# Patient Record
Sex: Male | Born: 1940 | Race: White | Hispanic: No | Marital: Married | Smoking: Former smoker
Health system: Southern US, Community
[De-identification: ages and names within clinical notes are randomized; demographics above are authoritative.]

## PROBLEM LIST (undated history)

## (undated) DIAGNOSIS — I4891 Unspecified atrial fibrillation: Secondary | ICD-10-CM

## (undated) DIAGNOSIS — I10 Essential (primary) hypertension: Secondary | ICD-10-CM

## (undated) DIAGNOSIS — F419 Anxiety disorder, unspecified: Secondary | ICD-10-CM

## (undated) DIAGNOSIS — F0390 Unspecified dementia without behavioral disturbance: Secondary | ICD-10-CM

## (undated) DIAGNOSIS — E119 Type 2 diabetes mellitus without complications: Secondary | ICD-10-CM

## (undated) DIAGNOSIS — H539 Unspecified visual disturbance: Secondary | ICD-10-CM

## (undated) DIAGNOSIS — E785 Hyperlipidemia, unspecified: Secondary | ICD-10-CM

## (undated) HISTORY — PX: COLONOSCOPY W/ BIOPSIES AND POLYPECTOMY: SHX1376

## (undated) HISTORY — PX: TRANSURETHRAL RESECTION OF PROSTATE: SHX73

## (undated) HISTORY — PX: CATARACT EXTRACTION W/ INTRAOCULAR LENS IMPLANT: SHX1309

## (undated) HISTORY — DX: Hyperlipidemia, unspecified: E78.5

## (undated) HISTORY — DX: Unspecified visual disturbance: H53.9

---

## 1998-10-18 ENCOUNTER — Encounter: Payer: Self-pay | Admitting: Cardiology

## 1998-10-18 ENCOUNTER — Emergency Department (HOSPITAL_COMMUNITY): Admission: EM | Admit: 1998-10-18 | Discharge: 1998-10-18 | Payer: Self-pay | Admitting: *Deleted

## 2004-11-02 ENCOUNTER — Ambulatory Visit (HOSPITAL_COMMUNITY): Admission: RE | Admit: 2004-11-02 | Discharge: 2004-11-02 | Payer: Self-pay | Admitting: Gastroenterology

## 2005-08-06 ENCOUNTER — Ambulatory Visit (HOSPITAL_BASED_OUTPATIENT_CLINIC_OR_DEPARTMENT_OTHER): Admission: RE | Admit: 2005-08-06 | Discharge: 2005-08-06 | Payer: Self-pay | Admitting: Urology

## 2014-03-25 ENCOUNTER — Ambulatory Visit: Payer: Self-pay | Admitting: *Deleted

## 2014-04-16 ENCOUNTER — Ambulatory Visit: Payer: Self-pay | Admitting: *Deleted

## 2016-03-01 ENCOUNTER — Other Ambulatory Visit: Payer: Self-pay | Admitting: Gastroenterology

## 2016-04-04 ENCOUNTER — Encounter (HOSPITAL_COMMUNITY): Payer: Self-pay | Admitting: *Deleted

## 2016-04-10 ENCOUNTER — Ambulatory Visit (HOSPITAL_COMMUNITY)
Admission: RE | Admit: 2016-04-10 | Discharge: 2016-04-10 | Disposition: A | Discharge status: Home / Self Care | Payer: Medicare Other | Source: Ambulatory Visit | Attending: Gastroenterology | Admitting: Gastroenterology

## 2016-04-10 ENCOUNTER — Encounter (HOSPITAL_COMMUNITY): Payer: Self-pay

## 2016-04-10 ENCOUNTER — Encounter (HOSPITAL_COMMUNITY)
Admission: RE | Disposition: A | Discharge status: Home / Self Care | Payer: Self-pay | Source: Ambulatory Visit | Attending: Gastroenterology

## 2016-04-10 ENCOUNTER — Ambulatory Visit (HOSPITAL_COMMUNITY): Payer: Medicare Other | Admitting: Certified Registered Nurse Anesthetist

## 2016-04-10 DIAGNOSIS — Z79899 Other long term (current) drug therapy: Secondary | ICD-10-CM | POA: Insufficient documentation

## 2016-04-10 DIAGNOSIS — E7800 Pure hypercholesterolemia, unspecified: Secondary | ICD-10-CM | POA: Diagnosis not present

## 2016-04-10 DIAGNOSIS — K621 Rectal polyp: Secondary | ICD-10-CM | POA: Insufficient documentation

## 2016-04-10 DIAGNOSIS — Z7984 Long term (current) use of oral hypoglycemic drugs: Secondary | ICD-10-CM | POA: Diagnosis not present

## 2016-04-10 DIAGNOSIS — D124 Benign neoplasm of descending colon: Secondary | ICD-10-CM | POA: Insufficient documentation

## 2016-04-10 DIAGNOSIS — Z86010 Personal history of colonic polyps: Secondary | ICD-10-CM | POA: Diagnosis not present

## 2016-04-10 DIAGNOSIS — Z1211 Encounter for screening for malignant neoplasm of colon: Secondary | ICD-10-CM | POA: Diagnosis not present

## 2016-04-10 DIAGNOSIS — I10 Essential (primary) hypertension: Secondary | ICD-10-CM | POA: Insufficient documentation

## 2016-04-10 DIAGNOSIS — Z87891 Personal history of nicotine dependence: Secondary | ICD-10-CM | POA: Insufficient documentation

## 2016-04-10 DIAGNOSIS — D122 Benign neoplasm of ascending colon: Secondary | ICD-10-CM | POA: Insufficient documentation

## 2016-04-10 DIAGNOSIS — E119 Type 2 diabetes mellitus without complications: Secondary | ICD-10-CM | POA: Insufficient documentation

## 2016-04-10 HISTORY — PX: COLONOSCOPY WITH PROPOFOL: SHX5780

## 2016-04-10 HISTORY — DX: Essential (primary) hypertension: I10

## 2016-04-10 HISTORY — DX: Anxiety disorder, unspecified: F41.9

## 2016-04-10 LAB — GLUCOSE, CAPILLARY: Glucose-Capillary: 88 mg/dL (ref 65–99)

## 2016-04-10 SURGERY — COLONOSCOPY WITH PROPOFOL
Anesthesia: Monitor Anesthesia Care

## 2016-04-10 MED ORDER — PROPOFOL 10 MG/ML IV BOLUS
INTRAVENOUS | Status: AC
  Filled 2016-04-10: qty 20

## 2016-04-10 MED ORDER — ONDANSETRON HCL 4 MG/2ML IJ SOLN
INTRAMUSCULAR | Status: AC
  Filled 2016-04-10: qty 2

## 2016-04-10 MED ORDER — SODIUM CHLORIDE 0.9 % IV SOLN: INTRAVENOUS | Status: DC

## 2016-04-10 MED ORDER — PROPOFOL 10 MG/ML IV BOLUS
INTRAVENOUS | Status: DC | PRN
  Administered 2016-04-10: 20 mg via INTRAVENOUS
  Administered 2016-04-10: 10 mg via INTRAVENOUS

## 2016-04-10 MED ORDER — PROPOFOL 10 MG/ML IV BOLUS
INTRAVENOUS | Status: AC
  Filled 2016-04-10: qty 40

## 2016-04-10 MED ORDER — PROPOFOL 500 MG/50ML IV EMUL
INTRAVENOUS | Status: DC | PRN
  Administered 2016-04-10: 150 ug/kg/min via INTRAVENOUS

## 2016-04-10 MED ORDER — ONDANSETRON HCL 4 MG/2ML IJ SOLN
INTRAMUSCULAR | Status: DC | PRN
  Administered 2016-04-10: 4 mg via INTRAVENOUS

## 2016-04-10 MED ORDER — LACTATED RINGERS IV SOLN
INTRAVENOUS | Status: DC
  Administered 2016-04-10: 10:00:00 via INTRAVENOUS

## 2016-04-10 SURGICAL SUPPLY — 22 items

## 2016-04-10 NOTE — H&P (Signed)
  Procedure: Surveillance colonoscopy. 11/14/2009 colonoscopy was performed with removal of two diminutive adenomatous colon polyps  History: The patient is a 75 year old male born August 21, 1940. He is scheduled to undergo a surveillance colonoscopy today.  Past medical history: Hypertension. Type 2 diabetes mellitus. Hypercholesterolemia. Benign prostatic hypertrophy.  Medication allergies: Norvasc. Lipitor. Crestor. Pravastatin.  Exam: The patient is alert and lying comfortably on the endoscopy stretcher. Abdomen is soft and nontender to palpation. Lungs are clear to auscultation. Cardiac exam reveals a regular rhythm.  Plan: Proceed with surveillance colonoscopy

## 2016-04-10 NOTE — Discharge Instructions (Signed)
YOU HAD AN ENDOSCOPIC PROCEDURE TODAY: Refer to the procedure report and other information in the discharge instructions given to you for any specific questions about what was found during the examination. If this information does not answer your questions, please call Dr. Wynetta Emery office at [phone] to clarify.   YOU SHOULD EXPECT: Some feelings of bloating in the abdomen. Passage of more gas than usual. Walking can help get rid of the air that was put into your GI tract during the procedure and reduce the bloating. If you had a lower endoscopy (such as a colonoscopy or flexible sigmoidoscopy) you may notice spotting of blood in your stool or on the toilet paper. Some abdominal soreness may be present for a day or two, also.  DIET: Your first meal following the procedure should be a light meal and then it is ok to progress to your normal diet. A half-sandwich or bowl of soup is an example of a good first meal. Heavy or fried foods are harder to digest and may make you feel nauseous or bloated. Drink plenty of fluids but you should avoid alcoholic beverages for 24 hours. If you had a esophageal dilation, please see attached instructions for diet.   ACTIVITY: Your care partner should take you home directly after the procedure. You should plan to take it easy, moving slowly for the rest of the day. You can resume normal activity the day after the procedure however YOU SHOULD NOT DRIVE, use power tools, machinery or perform tasks that involve climbing or major physical exertion for 24 hours (because of the sedation medicines used during the test).   SYMPTOMS TO REPORT IMMEDIATELY: A gastroenterologist can be reached at any hour. Please call [phone]  for any of the following symptoms:  Following lower endoscopy (colonoscopy, flexible sigmoidoscopy) Excessive amounts of blood in the stool  Significant tenderness, worsening of abdominal pains  Swelling of the abdomen that is new, acute  Fever of 100  or higher  Following upper endoscopy (EGD, EUS, ERCP, esophageal dilation) Vomiting of blood or coffee ground material  New, significant abdominal pain  New, significant chest pain or pain under the shoulder blades  Painful or persistently difficult swallowing  New shortness of breath  Black, tarry-looking or red, bloody stools  FOLLOW UP:  If any biopsies were taken you will be contacted by phone or by letter within the next 1-3 weeks. Call [phone]  if you have not heard about the biopsies in 3 weeks.  Please also call with any specific questions about appointments or follow up tests.

## 2016-04-10 NOTE — Anesthesia Preprocedure Evaluation (Signed)
Anesthesia Evaluation  Patient identified by MRN, date of birth, ID band Patient awake    Reviewed: Allergy & Precautions, NPO status , Patient's Chart, lab work & pertinent test results  Airway Mallampati: II  TM Distance: >3 FB Neck ROM: Full    Dental   Pulmonary former smoker,    breath sounds clear to auscultation       Cardiovascular hypertension, Pt. on medications  Rhythm:Regular Rate:Normal     Neuro/Psych Anxiety negative neurological ROS     GI/Hepatic negative GI ROS, Neg liver ROS,   Endo/Other  diabetes, Type 2, Oral Hypoglycemic Agents  Renal/GU negative Renal ROS     Musculoskeletal   Abdominal   Peds  Hematology negative hematology ROS (+)   Anesthesia Other Findings   Reproductive/Obstetrics                             Anesthesia Physical Anesthesia Plan  ASA: II  Anesthesia Plan: MAC   Post-op Pain Management:    Induction: Intravenous  Airway Management Planned: Nasal Cannula, Natural Airway and Simple Face Mask  Additional Equipment:   Intra-op Plan:   Post-operative Plan:   Informed Consent: I have reviewed the patients History and Physical, chart, labs and discussed the procedure including the risks, benefits and alternatives for the proposed anesthesia with the patient or authorized representative who has indicated his/her understanding and acceptance.     Plan Discussed with: CRNA  Anesthesia Plan Comments:         Anesthesia Quick Evaluation

## 2016-04-10 NOTE — Anesthesia Postprocedure Evaluation (Signed)
Anesthesia Post Note  Patient: Richard Frederick  Procedure(s) Performed: Procedure(s) (LRB): COLONOSCOPY WITH PROPOFOL (N/A)  Patient location during evaluation: PACU Anesthesia Type: MAC Level of consciousness: awake and alert Pain management: pain level controlled Vital Signs Assessment: post-procedure vital signs reviewed and stable Respiratory status: spontaneous breathing, nonlabored ventilation, respiratory function stable and patient connected to nasal cannula oxygen Cardiovascular status: stable and blood pressure returned to baseline Anesthetic complications: no    Last Vitals:  Vitals:   04/10/16 1148 04/10/16 1200  BP: 108/73 (!) 128/98  Pulse: 74 63  Resp: 10 11  Temp: 36.4 C     Last Pain:  Vitals:   04/10/16 1148  TempSrc: Oral                 Tiajuana Amass

## 2016-04-10 NOTE — Transfer of Care (Signed)
Immediate Anesthesia Transfer of Care Note  Patient: Richard Frederick  Procedure(s) Performed: Procedure(s): COLONOSCOPY WITH PROPOFOL (N/A)  Patient Location: PACU  Anesthesia Type:MAC  Level of Consciousness:  sedated, patient cooperative and responds to stimulation  Airway & Oxygen Therapy:Patient Spontanous Breathing and Patient connected to face mask oxgen  Post-op Assessment:  Report given to PACU RN and Post -op Vital signs reviewed and stable  Post vital signs:  Reviewed and stable  Last Vitals:  Vitals:   04/10/16 1005  BP: (!) 169/91  Pulse: 70  Resp: 20  Temp: [id number] C    Complications: No apparent anesthesia complications

## 2016-04-10 NOTE — Op Note (Signed)
[facility] Patient Name: Richard Frederick Procedure Date: 04/10/2016 MRN: [id number] Attending MD: Garlan Fair , MD Date of Birth: 02-25-1941 CSN: [id number] Age: 75 Admit Type: Outpatient Procedure:                Colonoscopy Indications:              High risk colon cancer surveillance: Personal                            history of adenoma less than 10 mm in size Providers:                Garlan Fair, MD, Elmer Ramp. Tilden Dome, RN, [employer], Technician, Christell Faith, CRNA Referring MD:              Medicines:                Propofol per Anesthesia Complications:            No immediate complications. Estimated Blood Loss:     Estimated blood loss: none. Procedure:                Pre-Anesthesia Assessment:                           - Prior to the procedure, a History and Physical                            was performed, and patient medications and                            allergies were reviewed. The patient's tolerance of                            previous anesthesia was also reviewed. The risks                            and benefits of the procedure and the sedation                            options and risks were discussed with the patient.                            All questions were answered, and informed consent                            was obtained. Prior Anticoagulants: The patient has                            taken aspirin, last dose was 1 day prior to                            procedure. ASA Grade Assessment: II - A patient  with mild systemic disease. After reviewing the                            risks and benefits, the patient was deemed in                            satisfactory condition to undergo the procedure.                           After obtaining informed consent, the colonoscope                            was passed under direct vision. Throughout the                             procedure, the patient's blood pressure, pulse, and                            oxygen saturations were monitored continuously. The                            [id number]) scope was introduced through                            the anus and advanced to the the cecum, identified                            by appendiceal orifice and ileocecal valve. The                            colonoscopy was performed without difficulty. The                            patient tolerated the procedure well. The quality                            of the bowel preparation was good. The ileocecal                            valve, the appendiceal orifice and the rectum were                            photographed. Scope In: 11:03:55 AM Scope Out: 11:35:34 AM Scope Withdrawal Time: 0 hours 24 minutes 34 seconds  Total Procedure Duration: 0 hours 31 minutes 39 seconds  Findings:      The perianal and digital rectal examinations were normal.      Two sessile polyps were found in the ascending colon. The polyps were 5       mm in size. These polyps were removed with a cold snare. Resection and       retrieval were complete.      A 5 mm polyp was found in the descending colon. The polyp was sessile.       The polyp was removed with a cold snare. Resection and  retrieval were       complete.      A 5 mm polyp was found in the rectum. The polyp was sessile. The polyp       was removed with a cold snare. Resection and retrieval were complete.      The exam was otherwise without abnormality. Impression:               - Two 5 mm polyps in the ascending colon, removed                            with a cold snare. Resected and retrieved.                           - One 5 mm polyp in the descending colon, removed                            with a cold snare. Resected and retrieved.                           - One 5 mm polyp in the rectum, removed with a cold                            snare. Resected and  retrieved.                           - The examination was otherwise normal. Moderate Sedation:      N/A- Per Anesthesia Care Recommendation:           - Patient has a contact number available for                            emergencies. The signs and symptoms of potential                            delayed complications were discussed with the                            patient. Return to normal activities tomorrow.                            Written discharge instructions were provided to the                            patient.                           - Repeat colonoscopy is not recommended for                            surveillance.                           - Resume previous diet.                           - Continue present  medications. Procedure Code(s):        --- Professional ---                           [phone], Colonoscopy, flexible; with removal of                            tumor(s), polyp(s), or other lesion(s) by snare                            technique Diagnosis Code(s):        --- Professional ---                           Z86.010, Personal history of colonic polyps                           D12.2, Benign neoplasm of ascending colon                           D12.4, Benign neoplasm of descending colon                           K62.1, Rectal polyp CPT copyright 2016 American Medical Association. All rights reserved. The codes documented in this report are preliminary and upon coder review may  be revised to meet current compliance requirements. Earle Gell, MD Garlan Fair, MD 04/10/2016 11:44:22 AM This report has been signed electronically. Number of Addenda: 0

## 2016-04-10 NOTE — Anesthesia Procedure Notes (Addendum)
Procedure Name: MAC Date/Time: 04/10/2016 10:59 AM Performed by: West Pugh Pre-anesthesia Checklist: Patient identified, Timeout performed, Emergency Drugs available, Suction available and Patient being monitored Patient Re-evaluated:Patient Re-evaluated prior to inductionOxygen Delivery Method: Simple face mask Dental Injury: Teeth and Oropharynx as per pre-operative assessment

## 2016-04-24 ENCOUNTER — Encounter (HOSPITAL_COMMUNITY): Payer: Self-pay | Admitting: *Deleted

## 2016-04-24 ENCOUNTER — Emergency Department (HOSPITAL_COMMUNITY): Payer: Medicare Other

## 2016-04-24 ENCOUNTER — Observation Stay (HOSPITAL_COMMUNITY)
Admission: EM | Admit: 2016-04-24 | Discharge: 2016-04-25 | Disposition: A | Discharge status: Home / Self Care | Payer: Medicare Other | Attending: Internal Medicine | Admitting: Internal Medicine

## 2016-04-24 DIAGNOSIS — R42 Dizziness and giddiness: Principal | ICD-10-CM

## 2016-04-24 DIAGNOSIS — E119 Type 2 diabetes mellitus without complications: Secondary | ICD-10-CM

## 2016-04-24 DIAGNOSIS — F419 Anxiety disorder, unspecified: Secondary | ICD-10-CM | POA: Diagnosis not present

## 2016-04-24 DIAGNOSIS — Z803 Family history of malignant neoplasm of breast: Secondary | ICD-10-CM | POA: Diagnosis not present

## 2016-04-24 DIAGNOSIS — Z7982 Long term (current) use of aspirin: Secondary | ICD-10-CM | POA: Insufficient documentation

## 2016-04-24 DIAGNOSIS — Z9889 Other specified postprocedural states: Secondary | ICD-10-CM | POA: Insufficient documentation

## 2016-04-24 DIAGNOSIS — D72829 Elevated white blood cell count, unspecified: Secondary | ICD-10-CM | POA: Insufficient documentation

## 2016-04-24 DIAGNOSIS — Z87891 Personal history of nicotine dependence: Secondary | ICD-10-CM | POA: Insufficient documentation

## 2016-04-24 DIAGNOSIS — Z7984 Long term (current) use of oral hypoglycemic drugs: Secondary | ICD-10-CM | POA: Diagnosis not present

## 2016-04-24 DIAGNOSIS — I10 Essential (primary) hypertension: Secondary | ICD-10-CM | POA: Diagnosis not present

## 2016-04-24 DIAGNOSIS — Z8249 Family history of ischemic heart disease and other diseases of the circulatory system: Secondary | ICD-10-CM | POA: Insufficient documentation

## 2016-04-24 HISTORY — DX: Type 2 diabetes mellitus without complications: E11.9

## 2016-04-24 LAB — COMPREHENSIVE METABOLIC PANEL
ALT: 18 U/L (ref 17–63)
ANION GAP: 7 (ref 5–15)
AST: 21 U/L (ref 15–41)
Albumin: 4 g/dL (ref 3.5–5.0)
Alkaline Phosphatase: 48 U/L (ref 38–126)
BILIRUBIN TOTAL: 0.8 mg/dL (ref 0.3–1.2)
BUN: 20 mg/dL (ref 6–20)
CHLORIDE: 104 mmol/L (ref 101–111)
CO2: 24 mmol/L (ref 22–32)
Calcium: 9.6 mg/dL (ref 8.9–10.3)
Creatinine, Ser: 1.08 mg/dL (ref 0.61–1.24)
Glucose, Bld: 167 mg/dL — ABNORMAL HIGH (ref 65–99)
POTASSIUM: 4.1 mmol/L (ref 3.5–5.1)
Sodium: 135 mmol/L (ref 135–145)
TOTAL PROTEIN: 6.8 g/dL (ref 6.5–8.1)

## 2016-04-24 LAB — URINALYSIS, ROUTINE W REFLEX MICROSCOPIC
BILIRUBIN URINE: NEGATIVE
Glucose, UA: 100 mg/dL — AB
Hgb urine dipstick: NEGATIVE
KETONES UR: NEGATIVE mg/dL
LEUKOCYTES UA: NEGATIVE
NITRITE: NEGATIVE
PROTEIN: 100 mg/dL — AB
Specific Gravity, Urine: 1.025 (ref 1.005–1.030)
pH: 6.5 (ref 5.0–8.0)

## 2016-04-24 LAB — CBC
HEMATOCRIT: 47.6 % (ref 39.0–52.0)
HEMOGLOBIN: 15.7 g/dL (ref 13.0–17.0)
MCH: 29.1 pg (ref 26.0–34.0)
MCHC: 33 g/dL (ref 30.0–36.0)
MCV: 88.1 fL (ref 78.0–100.0)
Platelets: 233 10*3/uL (ref 150–400)
RBC: 5.4 MIL/uL (ref 4.22–5.81)
RDW: 13 % (ref 11.5–15.5)
WBC: 9 10*3/uL (ref 4.0–10.5)

## 2016-04-24 LAB — URINE MICROSCOPIC-ADD ON: RBC / HPF: NONE SEEN RBC/hpf (ref 0–5)

## 2016-04-24 LAB — PROTIME-INR
INR: 1.09
Prothrombin Time: 14.1 seconds (ref 11.4–15.2)

## 2016-04-24 LAB — DIFFERENTIAL
BASOS PCT: 0 %
Basophils Absolute: 0 10*3/uL (ref 0.0–0.1)
EOS ABS: 0 10*3/uL (ref 0.0–0.7)
Eosinophils Relative: 0 %
LYMPHS ABS: 0.9 10*3/uL (ref 0.7–4.0)
Lymphocytes Relative: 10 %
MONO ABS: 0.2 10*3/uL (ref 0.1–1.0)
MONOS PCT: 2 %
Neutro Abs: 7.9 10*3/uL — ABNORMAL HIGH (ref 1.7–7.7)
Neutrophils Relative %: 88 %

## 2016-04-24 LAB — APTT: aPTT: 28 seconds (ref 24–36)

## 2016-04-24 LAB — GLUCOSE, CAPILLARY: GLUCOSE-CAPILLARY: 204 mg/dL — AB (ref 65–99)

## 2016-04-24 LAB — I-STAT TROPONIN, ED: TROPONIN I, POC: 0.01 ng/mL (ref 0.00–0.08)

## 2016-04-24 MED ORDER — LISINOPRIL 10 MG PO TABS
10.0000 mg | ORAL_TABLET | Freq: Every day | ORAL | Status: DC
  Administered 2016-04-24 – 2016-04-25 (×2): 10 mg via ORAL
  Filled 2016-04-24 (×2): qty 1

## 2016-04-24 MED ORDER — ASPIRIN EC 81 MG PO TBEC
81.0000 mg | DELAYED_RELEASE_TABLET | Freq: Every day | ORAL | Status: DC
  Administered 2016-04-24 – 2016-04-25 (×2): 81 mg via ORAL
  Filled 2016-04-24 (×2): qty 1

## 2016-04-24 MED ORDER — LISINOPRIL-HYDROCHLOROTHIAZIDE 10-12.5 MG PO TABS: 1.0000 | ORAL_TABLET | Freq: Every day | ORAL | Status: DC

## 2016-04-24 MED ORDER — MECLIZINE HCL 25 MG PO TABS
25.0000 mg | ORAL_TABLET | Freq: Once | ORAL | Status: AC
  Administered 2016-04-24: 25 mg via ORAL
  Filled 2016-04-24: qty 1

## 2016-04-24 MED ORDER — DIAZEPAM 2 MG PO TABS
2.0000 mg | ORAL_TABLET | Freq: Three times a day (TID) | ORAL | Status: DC | PRN
Start: 2016-04-24 — End: 2016-04-25

## 2016-04-24 MED ORDER — ZOLPIDEM TARTRATE 5 MG PO TABS
5.0000 mg | ORAL_TABLET | Freq: Once | ORAL | Status: AC
  Administered 2016-04-24: 5 mg via ORAL
  Filled 2016-04-24: qty 1

## 2016-04-24 MED ORDER — SODIUM CHLORIDE 0.9 % IV SOLN
INTRAVENOUS | Status: AC
  Administered 2016-04-24: 1000 mL via INTRAVENOUS

## 2016-04-24 MED ORDER — ACETAMINOPHEN 650 MG RE SUPP: 650.0000 mg | Freq: Four times a day (QID) | RECTAL | Status: DC | PRN

## 2016-04-24 MED ORDER — DIAZEPAM 5 MG/ML IJ SOLN
2.5000 mg | Freq: Once | INTRAMUSCULAR | Status: AC
  Administered 2016-04-24: 2.5 mg via INTRAVENOUS
  Filled 2016-04-24: qty 2

## 2016-04-24 MED ORDER — ONDANSETRON HCL 4 MG PO TABS: 4.0000 mg | ORAL_TABLET | Freq: Four times a day (QID) | ORAL | Status: DC | PRN

## 2016-04-24 MED ORDER — ENOXAPARIN SODIUM 40 MG/0.4ML ~~LOC~~ SOLN
40.0000 mg | SUBCUTANEOUS | Status: DC
  Administered 2016-04-24: 40 mg via SUBCUTANEOUS
  Filled 2016-04-24: qty 0.4

## 2016-04-24 MED ORDER — MECLIZINE HCL 25 MG PO TABS
25.0000 mg | ORAL_TABLET | Freq: Three times a day (TID) | ORAL | Status: DC
  Administered 2016-04-24 – 2016-04-25 (×2): 25 mg via ORAL
  Filled 2016-04-24 (×2): qty 1

## 2016-04-24 MED ORDER — SODIUM CHLORIDE 0.9 % IV BOLUS (SEPSIS)
500.0000 mL | Freq: Once | INTRAVENOUS | Status: AC
  Administered 2016-04-24: 500 mL via INTRAVENOUS

## 2016-04-24 MED ORDER — HYDROCHLOROTHIAZIDE 12.5 MG PO CAPS
12.5000 mg | ORAL_CAPSULE | Freq: Every day | ORAL | Status: DC
  Administered 2016-04-24 – 2016-04-25 (×2): 12.5 mg via ORAL
  Filled 2016-04-24 (×2): qty 1

## 2016-04-24 MED ORDER — INSULIN ASPART 100 UNIT/ML ~~LOC~~ SOLN
0.0000 [IU] | Freq: Three times a day (TID) | SUBCUTANEOUS | Status: DC
  Administered 2016-04-25: 3 [IU] via SUBCUTANEOUS

## 2016-04-24 MED ORDER — ONDANSETRON HCL 4 MG/2ML IJ SOLN: 4.0000 mg | Freq: Four times a day (QID) | INTRAMUSCULAR | Status: DC | PRN

## 2016-04-24 MED ORDER — ACETAMINOPHEN 325 MG PO TABS: 650.0000 mg | ORAL_TABLET | Freq: Four times a day (QID) | ORAL | Status: DC | PRN

## 2016-04-24 MED ORDER — HYDRALAZINE HCL 20 MG/ML IJ SOLN: 10.0000 mg | Freq: Four times a day (QID) | INTRAMUSCULAR | Status: DC | PRN

## 2016-04-24 MED ORDER — PAROXETINE HCL 20 MG PO TABS
20.0000 mg | ORAL_TABLET | Freq: Every day | ORAL | Status: DC
  Administered 2016-04-25: 20 mg via ORAL
  Filled 2016-04-24: qty 1

## 2016-04-24 NOTE — ED Notes (Signed)
Attempted to ambulate pt Pt was unable to stand up straight and was leaning backwards while standing stating he was really dizzy. Pt helped back in the bed and Dr. Billy Fischer informed.

## 2016-04-24 NOTE — ED Notes (Signed)
Patient transported to MRI 

## 2016-04-24 NOTE — ED Notes (Signed)
Attempted report to 5W. 

## 2016-04-24 NOTE — Progress Notes (Signed)
Patient trasfered from ED to [phone] via stretcher; alert and oriented x 4; no complaints of pain; IV saline locked in RH; skin intact. Orient patient to room and unit; gave patient care guide; instructed how to use the call bell and  fall risk precautions. Will continue to monitor the patient.

## 2016-04-24 NOTE — Progress Notes (Signed)
Patient requesting something for sleep.  Paged on call, Baltazar Najjar. Waiting to hear back.

## 2016-04-24 NOTE — ED Provider Notes (Signed)
[facility] DEPT Provider Note   CSN: [id number] Arrival date & time: 04/24/16  [id number]     History   Chief Complaint Chief Complaint  Patient presents with  . Dizziness    HPI KENNAN MCDOLE is a 75 y.o. male.  HPI   Woke up at 3AM per pt 5AM per wife feeling dizzy, couldn't walk, had to hold onto wall.  18 years ago had vertigo. Emesis.  Lasted a few hours.  Now it's better. When had head vertical would go away. When turned head a little bit would come back, severe.  No other neuro symptoms except difficulty walking because of dizziness.  Severe vertigo with movement.  Past Medical History:  Diagnosis Date  . Anxiety   . Diabetes mellitus without complication ([place])   . Hypertension     Patient Active Problem List   Diagnosis Date Noted  . Vertigo 04/24/2016  . Essential hypertension 04/24/2016  . DM (diabetes mellitus), type 2 ([place]) 04/24/2016    Past Surgical History:  Procedure Laterality Date  . COLONOSCOPY WITH PROPOFOL N/A 04/10/2016   Procedure: COLONOSCOPY WITH PROPOFOL;  Surgeon: Garlan Fair, MD;  Location: [facility] ENDOSCOPY;  Service: Endoscopy;  Laterality: N/A;  . colonscopy    . TRANSURETHRAL RESECTION OF PROSTATE  10-12-yrs ago       Home Medications    Prior to Admission medications   Medication Sig Start Date End Date Taking? Authorizing Provider  aspirin 81 MG tablet Take 81 mg by mouth daily.   Yes Historical Provider, MD  Cyanocobalamin (VITAMIN B 12 PO) Take by mouth. 1000 mg daily   Yes Historical Provider, MD  lisinopril-hydrochlorothiazide (PRINZIDE,ZESTORETIC) 10-12.5 MG tablet Take 1 tablet by mouth daily.   Yes Historical Provider, MD  metFORMIN (GLUCOPHAGE) 500 MG tablet Take by mouth 2 (two) times daily with a meal.   Yes Historical Provider, MD  PARoxetine (PAXIL) 20 MG tablet Take 20 mg by mouth daily.   Yes Historical Provider, MD    Family History Family History  Problem Relation Age of Onset  . Breast cancer Sister   .  Heart disease Sister     Social History Social History  Substance Use Topics  . Smoking status: Former Smoker    Quit date: 07/30/1980  . Smokeless tobacco: Former Systems developer  . Alcohol use No     Allergies   Review of patient's allergies indicates no known allergies.   Review of Systems Review of Systems  Constitutional: Negative for fever.  HENT: Positive for tinnitus (chronic since that time). Negative for congestion.   Eyes: Negative for visual disturbance.  Respiratory: Negative for shortness of breath.   Cardiovascular: Negative for chest pain.  Gastrointestinal: Positive for nausea and vomiting. Negative for abdominal pain and diarrhea.  Genitourinary: Negative for difficulty urinating and dysuria.  Neurological: Positive for dizziness and headaches (very slight on the way here). Negative for syncope, facial asymmetry, speech difficulty, weakness, light-headedness and numbness.     Physical Exam        Updated Vital Signs BP 133/66 (BP Location: Right Arm)   Pulse 83   Temp 98.3 F (36.8 C) (Oral)   Resp 18   Wt 175 lb 14.8 oz (79.8 kg)   SpO2 95%   BMI 27.15 kg/m   Physical Exam  Constitutional: He is oriented to person, place, and time. He appears well-developed and well-nourished. No distress.  HENT:  Head: Normocephalic and atraumatic.  Head impulse testing without eyes deviating from stare  TM bilaterally normal  Eyes: Conjunctivae and EOM are normal. Pupils are equal, round, and reactive to light.  Neck: Normal range of motion.  Cardiovascular: Normal rate, regular rhythm, normal heart sounds and intact distal pulses.  Exam reveals no gallop and no friction rub.   No murmur heard. Pulmonary/Chest: Effort normal and breath sounds normal. No respiratory distress. He has no wheezes. He has no rales.  Abdominal: Soft. He exhibits no distension. There is no tenderness. There is no guarding.  Musculoskeletal: He exhibits no edema.  Neurological: He is alert  and oriented to person, place, and time. He has normal strength. He displays no tremor. No cranial nerve deficit or sensory deficit. Gait abnormal. Coordination normal. GCS eye subscore is 4. GCS verbal subscore is 5. GCS motor subscore is 6.  Patient unable to ambulate, off balance, leaning towards left  Skin: Skin is warm and dry. He is not diaphoretic.  Nursing note and vitals reviewed.    ED Treatments / Results  Labs (all labs ordered are listed, but only abnormal results are displayed) Labs Reviewed  DIFFERENTIAL - Abnormal; Notable for the following:       Result Value   Neutro Abs 7.9 (*)    All other components within normal limits  COMPREHENSIVE METABOLIC PANEL - Abnormal; Notable for the following:    Glucose, Bld 167 (*)    All other components within normal limits  URINALYSIS, ROUTINE W REFLEX MICROSCOPIC (NOT AT [facility]) - Abnormal; Notable for the following:    Glucose, UA 100 (*)    Protein, ur 100 (*)    All other components within normal limits  URINE MICROSCOPIC-ADD ON - Abnormal; Notable for the following:    Squamous Epithelial / LPF 0-5 (*)    Bacteria, UA FEW (*)    All other components within normal limits  GLUCOSE, CAPILLARY - Abnormal; Notable for the following:    Glucose-Capillary 204 (*)    All other components within normal limits  PROTIME-INR  APTT  CBC  CBC  BASIC METABOLIC PANEL  I-STAT TROPOININ, ED    EKG  EKG Interpretation  Date/Time:  Tuesday April 24 2016 09:47:52 EDT Ventricular Rate:  82 PR Interval:    QRS Duration: 93 QT Interval:  378 QTC Calculation: 442 R Axis:   -17 Text Interpretation:  Sinus rhythm Borderline left axis deviation Baseline wander in lead(s) V2 V3 V4 No significant change since last tracing Confirmed by [place] ([id number]) on 04/24/2016 12:33:47 PM       Radiology Mr Brain Wo Contrast  Result Date: 04/24/2016 CLINICAL DATA:  75 year old male awoke with dizziness. Initial encounter. EXAM: MRI  HEAD WITHOUT CONTRAST TECHNIQUE: Multiplanar, multiecho pulse sequences of the brain and surrounding structures were obtained without intravenous contrast. COMPARISON:  None. FINDINGS: Brain: No acute infarct for intracranial hemorrhage. Very mild chronic microvascular changes. Mild global atrophy without hydrocephalus. No intracranial mass lesion noted on this unenhanced exam. Vascular: Major intracranial vascular structures are patent. Skull and upper cervical spine: Slightly heterogeneous clivus without mass identified. Minimal prominence of the transverse ligament. Sinuses/Orbits: Post right lens replacement. No acute orbital abnormality. Minimal mucosal thickening ethmoid sinus air cells. Other: Negative. IMPRESSION: No acute infarct or intracranial hemorrhage. Very mild chronic microvascular changes. Mild global atrophy without hydrocephalus. Electronically Signed   By: Genia Del M.D.   On: 04/24/2016 12:58    Procedures Procedures (including critical care time)  Medications Ordered in ED Medications  aspirin EC tablet 81 mg (81  mg Oral Given 04/24/16 2043)  PARoxetine (PAXIL) tablet 20 mg (not administered)  hydrALAZINE (APRESOLINE) injection 10 mg (not administered)  enoxaparin (LOVENOX) injection 40 mg (40 mg Subcutaneous Given 04/24/16 2043)  0.9 %  sodium chloride infusion (1,000 mLs Intravenous New Bag/Given 04/24/16 2042)  acetaminophen (TYLENOL) tablet 650 mg (not administered)    Or  acetaminophen (TYLENOL) suppository 650 mg (not administered)  ondansetron (ZOFRAN) tablet 4 mg (not administered)    Or  ondansetron (ZOFRAN) injection 4 mg (not administered)  insulin aspart (novoLOG) injection 0-15 Units (not administered)  meclizine (ANTIVERT) tablet 25 mg (not administered)  diazepam (VALIUM) tablet 2 mg (not administered)  lisinopril (PRINIVIL,ZESTRIL) tablet 10 mg (10 mg Oral Given 04/24/16 2043)    And  hydrochlorothiazide (MICROZIDE) capsule 12.5 mg (12.5 mg Oral Given  04/24/16 2043)  zolpidem (AMBIEN) tablet 5 mg (not administered)  meclizine (ANTIVERT) tablet 25 mg (25 mg Oral Given 04/24/16 1129)  meclizine (ANTIVERT) tablet 25 mg (25 mg Oral Given 04/24/16 1420)  diazepam (VALIUM) injection 2.5 mg (2.5 mg Intravenous Given 04/24/16 1610)  sodium chloride 0.9 % bolus 500 mL (0 mLs Intravenous Stopped 04/24/16 1828)     Initial Impression / Assessment and Plan / ED Course  I have reviewed the triage vital signs and the nursing notes.  Pertinent labs & imaging results that were available during my care of the patient were reviewed by me and considered in my medical decision making (see chart for details).  Clinical Course   75 year old male with a history of hypertension, diabetes, vertigo 18 years ago, presents with concern for dizziness. Patient woke up this morning with dizziness and difficulty ambulating.  History of dizziness worse with movement, no other neuro symptoms, as well as prior history of vertigo, may indicate peripheral etiology, however, patient is unable to ambulate in the emergency department. Given concern for possible central etiology, MRI was ordered. Patient was given meclizine.   No lightheadedness, no chest pain, no shortness of breath, and doubt other etiology of dizziness. Labs WNL.  MR returns showing no sign of CVA.  Patient given additional meclizine with continued dizziness with movements and inability to stand or walk. Given valium and again unable to stand or walk.  Discussed on phone with Neurologist and in setting of negative MRI doubt central etiology. Will continue to give medications to treat peripheral vertigo and admit for observation and treatment of currently intractable vertigo.  Final Clinical Impressions(s) / ED Diagnoses   Final diagnoses:  Vertigo    New Prescriptions Current Discharge Medication List       Gareth Morgan, MD 04/24/16 2257

## 2016-04-24 NOTE — ED Triage Notes (Signed)
Pt arrives from home via [facility]. Pt states he woke up at 0300 this morning and was dizzy. Pt states he went back to sleep and when he awoke again was more dizzy and unable to stand up. Pt was not orthostatic upon arrival and had a BM with assistance with ambulation from EMS then had an episode of emesis. Pt received 4mg  of zofran.

## 2016-04-24 NOTE — H&P (Signed)
[facility] History and Physical  Richard Frederick [id number] DOB: 01/27/1941 DOA: 04/24/2016   PCP: Irven Shelling, MD  Specialists: Dr. Earle Gell is his gastroenterologist. He has seen ENT in the past.  Chief Complaint: Dizziness since this morning  HPI: Richard Frederick is a 75 y.o. male with a past medical history of hypertension, diabetes type 2 on oral agents, remote history of vertigo who was in his usual state of health till this morning about 5 AM when he woke up and felt as if the room was spinning around him. He felt extremely dizzy, felt nauseated and might have even vomited. He called out to his wife. She couldn't help him get up to go to the bathroom. She called the insurance nurse line who suggested that they call 911. EMS came in to the house and helped him to the bathroom. He still felt extremely nauseated. EMS gave him anti-emetics. Started feeling better. During all this, he was sweating profusely. At no time did he have any weakness on any one side of his body. Denies any chest pain, shortness of breath. He had a colonoscopy about 2 weeks ago and had been constipated for the last week or so, but then had a large bowel movement this morning. He was found to have small polyps on colonoscopy. Patient tells me that his last episode of vertigo was 18 years ago. 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 Denies any hearing impairment. He has seen ENT in the past. Denies any recent changes or pain in either of his ears. Patient noted to be mildly distracted, presumably due to the Valium.  Patient was brought into the emergency department. MRI brain was done which did not show any acute findings. Patient is thought to have acute BPPV. He has been given meclizine. He feels better, however, has not been able to ambulate in the emergency department. Will need to be observed in the hospital overnight.  Home Medications: Prior to Admission medications   Medication Sig Start Date End  Date Taking? Authorizing Provider  aspirin 81 MG tablet Take 81 mg by mouth daily.   Yes Historical Provider, MD  Cyanocobalamin (VITAMIN B 12 PO) Take by mouth. 1000 mg daily   Yes Historical Provider, MD  lisinopril-hydrochlorothiazide (PRINZIDE,ZESTORETIC) 10-12.5 MG tablet Take 1 tablet by mouth daily.   Yes Historical Provider, MD  metFORMIN (GLUCOPHAGE) 500 MG tablet Take by mouth 2 (two) times daily with a meal.   Yes Historical Provider, MD  PARoxetine (PAXIL) 20 MG tablet Take 20 mg by mouth daily.   Yes Historical Provider, MD    Allergies: No Known Allergies  Past Medical History: Past Medical History:  Diagnosis Date  . Anxiety   . Diabetes mellitus without complication ([place])   . Hypertension     Past Surgical History:  Procedure Laterality Date  . COLONOSCOPY WITH PROPOFOL N/A 04/10/2016   Procedure: COLONOSCOPY WITH PROPOFOL;  Surgeon: Garlan Fair, MD;  Location: [facility];  Service: Endoscopy;  Laterality: N/A;  . colonscopy    . TRANSURETHRAL RESECTION OF PROSTATE  10-12-yrs ago    Social History: Patient lives with his wife. He quit smoking more than 34 years ago. Denies any alcohol consumption. Usually very active. He mows his own lawn. Goes for walks.  Family History:  Family History  Problem Relation Age of Onset  . Breast cancer Sister   . Heart disease Sister      Review of Systems - History obtained from the patient General  ROS: negative Psychological ROS: negative Ophthalmic ROS: negative ENT ROS: as in hpi Allergy and Immunology ROS: negative Hematological and Lymphatic ROS: negative Endocrine ROS: negative Respiratory ROS: no cough, shortness of breath, or wheezing Cardiovascular ROS: no chest pain or dyspnea on exertion Gastrointestinal ROS: no abdominal pain, change in bowel habits, or black or bloody stools Genito-Urinary ROS: no dysuria, trouble voiding, or hematuria Musculoskeletal ROS: negative Neurological ROS: mild headache.  otherwise as in hpi Dermatological ROS: negative  Physical Examination  Vitals:   04/24/16 1515 04/24/16 1545 04/24/16 1645 04/24/16 1700  BP: 166/83 159/88 153/89 160/87  Pulse: 97 93 95 95  Resp: 18 20 17 19   Temp:      TempSrc:      SpO2: 93% 92% 91% 93%    BP 160/87   Pulse 95   Temp 97.5 F (36.4 C) (Oral)   Resp 19   SpO2 93%   General appearance: alert, cooperative, appears stated age, distracted and no distress Head: Normocephalic, without obvious abnormality, atraumatic Eyes: Pupils are equal, reacting. Mild nystagmus towards the right with quick resolution. Ears: Mild cerumen in both years. Tympanic membranes visualized. No obvious deformity noted. Neck: no adenopathy, no carotid bruit, no JVD, supple, symmetrical, trachea midline and thyroid not enlarged, symmetric, no tenderness/mass/nodules Resp: clear to auscultation bilaterally Cardio: regular rate and rhythm, S1, S2 normal, no murmur, click, rub or gallop GI: soft, non-tender; bowel sounds normal; no masses,  no organomegaly Extremities: extremities normal, atraumatic, no cyanosis or edema Pulses: 2+ and symmetric Skin: Skin color, texture, turgor normal. No rashes or lesions Lymph nodes: Cervical, supraclavicular, and axillary nodes normal. Neurologic: Patient is awake, alert. Oriented 3. Cranial nerve II-12 intact. No pronator drift. No dysdiadochokinesis. Strength is equal bilateral upper and lower extremities. Gait was not assessed.  Labs on Admission: I have personally reviewed following labs and imaging studies  CBC:  Recent Labs Lab 04/24/16 1005  WBC 9.0  NEUTROABS 7.9*  HGB 15.7  HCT 47.6  MCV 88.1  PLT 0000000   Basic Metabolic Panel:  Recent Labs Lab 04/24/16 1005  NA 135  K 4.1  CL 104  CO2 24  GLUCOSE 167*  BUN 20  CREATININE 1.08  CALCIUM 9.6   GFR: CrCl cannot be calculated (Unknown ideal weight.). Liver Function Tests:  Recent Labs Lab 04/24/16 1005  AST 21  ALT 18    ALKPHOS 48  BILITOT 0.8  PROT 6.8  ALBUMIN 4.0   Coagulation Profile:  Recent Labs Lab 04/24/16 1005  INR 1.09   Urine analysis:    Component Value Date/Time   COLORURINE YELLOW 04/24/2016 1133   APPEARANCEUR CLEAR 04/24/2016 1133   LABSPEC 1.025 04/24/2016 1133   PHURINE 6.5 04/24/2016 1133   GLUCOSEU 100 (A) 04/24/2016 1133   HGBUR NEGATIVE 04/24/2016 1133   BILIRUBINUR NEGATIVE 04/24/2016 1133   KETONESUR NEGATIVE 04/24/2016 1133   PROTEINUR 100 (A) 04/24/2016 1133   NITRITE NEGATIVE 04/24/2016 1133   LEUKOCYTESUR NEGATIVE 04/24/2016 1133     Radiological Exams on Admission: Mr Brain Wo Contrast  Result Date: 04/24/2016 CLINICAL DATA:  75 year old male awoke with dizziness. Initial encounter. EXAM: MRI HEAD WITHOUT CONTRAST TECHNIQUE: Multiplanar, multiecho pulse sequences of the brain and surrounding structures were obtained without intravenous contrast. COMPARISON:  None. FINDINGS: Brain: No acute infarct for intracranial hemorrhage. Very mild chronic microvascular changes. Mild global atrophy without hydrocephalus. No intracranial mass lesion noted on this unenhanced exam. Vascular: Major intracranial vascular structures are patent. Skull and upper  cervical spine: Slightly heterogeneous clivus without mass identified. Minimal prominence of the transverse ligament. Sinuses/Orbits: Post right lens replacement. No acute orbital abnormality. Minimal mucosal thickening ethmoid sinus air cells. Other: Negative. IMPRESSION: No acute infarct or intracranial hemorrhage. Very mild chronic microvascular changes. Mild global atrophy without hydrocephalus. Electronically Signed   By: Genia Del M.D.   On: 04/24/2016 12:58    My interpretation of Electrocardiogram: EKG shows sinus rhythm, 82 bpm. Left axis deviation. No concerning ST or T-wave changes.   Problem List  Principal Problem:   Vertigo Active Problems:   Essential hypertension   DM (diabetes mellitus), type 2  ([place])   Assessment: This is a 75 year old Caucasian male with a past medical history as stated earlier, who presents with sudden onset of dizziness this morning. His symptoms are typical for benign positional paroxysmal vertigo. Patient will be observed overnight in the hospital. Central etiology has been ruled out by MRI.  Plan: #1 BPPV: Patient will be administered meclizine around-the-clock. He'll be given Valium as needed if he has persistent symptoms of vertigo despite. Antiemetics as needed. PT and OT evaluation tomorrow morning. Consider ENT eval as outpatient.  #2 history of essential hypertension: Blood pressure is noted to be elevated in the emergency department. Tells me that he hasn't taken his blood pressure medicine this morning. He will be given his home medication. Monitor closely.  #3 history of diabetes mellitus type 2 without any complications: Monitor CBGs. Hold his metformin for now. Sliding scale insulin coverage.   DVT Prophylaxis: Lovenox Code Status: Full code Family Communication: Discussed with the patient and his wife  Disposition Plan: Anticipate he'll be able to go back home. He might need home health  Consults called: None  Admission status: Observation    Further management decisions will depend on results of further testing and patient's response to treatment.   [facility]  [facility] Pager [phone]  If 7PM-7AM, please contact night-coverage [email or web address] Password [facility]  04/24/2016, 5:45 PM

## 2016-04-25 DIAGNOSIS — R42 Dizziness and giddiness: Secondary | ICD-10-CM | POA: Diagnosis not present

## 2016-04-25 DIAGNOSIS — I10 Essential (primary) hypertension: Secondary | ICD-10-CM | POA: Diagnosis not present

## 2016-04-25 LAB — BASIC METABOLIC PANEL
Anion gap: 5 (ref 5–15)
BUN: 13 mg/dL (ref 6–20)
CO2: 28 mmol/L (ref 22–32)
CREATININE: 1.19 mg/dL (ref 0.61–1.24)
Calcium: 9.2 mg/dL (ref 8.9–10.3)
Chloride: 105 mmol/L (ref 101–111)
GFR calc Af Amer: >60 mL/min (ref >60)
GFR, EST NON AFRICAN AMERICAN: 58 mL/min — AB (ref >60)
GLUCOSE: 100 mg/dL — AB (ref 65–99)
POTASSIUM: 4.1 mmol/L (ref 3.5–5.1)
SODIUM: 138 mmol/L (ref 135–145)

## 2016-04-25 LAB — CBC
HCT: 43.4 % (ref 39.0–52.0)
Hemoglobin: 14.1 g/dL (ref 13.0–17.0)
MCH: 28.3 pg (ref 26.0–34.0)
MCHC: 32.5 g/dL (ref 30.0–36.0)
MCV: 87.1 fL (ref 78.0–100.0)
PLATELETS: 233 10*3/uL (ref 150–400)
RBC: 4.98 MIL/uL (ref 4.22–5.81)
RDW: 13.3 % (ref 11.5–15.5)
WBC: 13.5 10*3/uL — ABNORMAL HIGH (ref 4.0–10.5)

## 2016-04-25 LAB — GLUCOSE, CAPILLARY
GLUCOSE-CAPILLARY: 92 mg/dL (ref 65–99)
GLUCOSE-CAPILLARY: 168 mg/dL — AB (ref 65–99)
Glucose-Capillary: 92 mg/dL (ref 65–99)

## 2016-04-25 MED ORDER — MECLIZINE HCL 25 MG PO TABS: 25.0000 mg | ORAL_TABLET | Freq: Three times a day (TID) | ORAL | 0 refills | Status: DC | PRN

## 2016-04-25 NOTE — Progress Notes (Signed)
Patient was discharged home by MD order; discharged instructions  review and give to patient and his wife with care notes; IV DIC; skin intact; patient will be escorted to the car by a volunteer via wheelchair.

## 2016-04-25 NOTE — Progress Notes (Signed)
Bed alarm went off.  I was checking on him, and he was confused, wondering where he was, how he got here.  He said he remembered me, he just did not remember the room.  After a few minutes he cleared somewhat.  Due to his confusion, I took his sugar and it was 92. Will continue to monitor him.

## 2016-04-25 NOTE — Discharge Summary (Addendum)
Physician Discharge Summary  Richard Frederick [id number] DOB: 06-11-1941 DOA: 04/24/2016  PCP: Irven Shelling, MD  Admit date: 04/24/2016 Discharge date: 04/25/2016  Admitted From: Home Disposition:  Home  Recommendations for Outpatient Follow-up:  1. Follow up with PCP in 1-2 weeks 2. Please obtain BMP/CBC in one week  Home Health:no Equipment/Devices:N/A  Discharge Condition:Stable CODE STATUS:FULL Diet recommendation: Heart Healthy / Carb Modified   Brief/Interim Summary: 75 year old male with history of hypertension, diabetes mellitus, and remote history of vertigo was in his usual state of health until the morning of 04/24/2016 when he developed acute onset of vertigo when he stood up to go to the bathroom. The patient was able to make it back to his bed where he laid for approximately 2 hours. He had no improvement of his symptoms. In fact any movement of his head at that time created worsening vertigo. As result, EMS was activated. In the emergency department, the patient continued to have some vertigo and had difficulty standing up. As a result, the patient was admitted for observation. MRI of the brain was obtained and was unremarkable. The patient denied any chest discomfort, shortness of breath, fevers, chills. In fact, the patient still mows his lawn with a push lawnmower and walks approximately 2 miles, 4 times per week without any difficulty. During these episodes of cardiovascular activity, the patient denies any chest discomfort or dizziness. The patient denies any recent antecedent URI type symptoms. Notably, the patient states that he has had a history of vertigo and tinnitus in the past for which he saw ear nose and throat. He states that the symptoms subsequently resolved and he has not had a follow-up with ENT for over 10 years. The patient remained afebrile and hemodynamically stable. The patient was treated symptomatically with Valium and meclizine.  Symptomatically, the patient improved. The patient worked with physical therapy and had no difficulty. The patient was discharged to follow-up with his primary care provider and ultimately follow-up with ENT should his symptoms recur.  Discharge Diagnoses:  Vertigo -Question benign positional vertigo -Was not able to elicit symptoms with Dix-Hallpike maneuver -Nevertheless, the patient is symptomatically improved and able to ablate with no difficulty -When necessary meclizine -Follow up with ENT should symptoms continue to recur -MRI brain negative for acute findings -personally reviewed EKG--sinus, no ST-T changes -UA neg for pyuria  Hypertension -Continue lisinopril/HCTZ -Certainly, that HCTZ can cause dizziness, but the patient states that he has been on this for many years -electrolytes and renal function stable  Diabetes mellitus type 2 -Resume metformin  Leukocytosis -likely stress demargination -afebrile and hemodnamically stable -clinically stable -f/u with PCP and repeat CBC in one week -WBC 13.5   Discharge Instructions  Discharge Instructions    Diet - low sodium heart healthy    Complete by:  As directed    Increase activity slowly    Complete by:  As directed        Medication List    TAKE these medications   aspirin 81 MG tablet Take 81 mg by mouth daily.   lisinopril-hydrochlorothiazide 10-12.5 MG tablet Commonly known as:  PRINZIDE,ZESTORETIC Take 1 tablet by mouth daily.   meclizine 25 MG tablet Commonly known as:  ANTIVERT Take 1 tablet (25 mg total) by mouth 3 (three) times daily as needed for dizziness.   metFORMIN 500 MG tablet Commonly known as:  GLUCOPHAGE Take by mouth 2 (two) times daily with a meal.   PARoxetine 20 MG tablet Commonly known as:  PAXIL Take 20 mg by mouth daily.   VITAMIN B 12 PO Take by mouth. 1000 mg daily       No Known Allergies  Consultations:  none   Procedures/Studies: Mr Brain Wo  Contrast  Result Date: 04/24/2016 CLINICAL DATA:  75 year old male awoke with dizziness. Initial encounter. EXAM: MRI HEAD WITHOUT CONTRAST TECHNIQUE: Multiplanar, multiecho pulse sequences of the brain and surrounding structures were obtained without intravenous contrast. COMPARISON:  None. FINDINGS: Brain: No acute infarct for intracranial hemorrhage. Very mild chronic microvascular changes. Mild global atrophy without hydrocephalus. No intracranial mass lesion noted on this unenhanced exam. Vascular: Major intracranial vascular structures are patent. Skull and upper cervical spine: Slightly heterogeneous clivus without mass identified. Minimal prominence of the transverse ligament. Sinuses/Orbits: Post right lens replacement. No acute orbital abnormality. Minimal mucosal thickening ethmoid sinus air cells. Other: Negative. IMPRESSION: No acute infarct or intracranial hemorrhage. Very mild chronic microvascular changes. Mild global atrophy without hydrocephalus. Electronically Signed   By: Genia Del M.D.   On: 04/24/2016 12:58        Discharge Exam: Vitals:   04/24/16 2120 04/25/16 0500  BP: 133/66 131/67  Pulse: 83 67  Resp: 18 18  Temp: 98.3 F (36.8 C) 98.2 F (36.8 C)   Vitals:   04/24/16 1830 04/24/16 1903 04/24/16 2120 04/25/16 0500  BP: 152/81 (!) 165/84 133/66 131/67  Pulse: 92 99 83 67  Resp: 19 18 18 18   Temp:  98.2 F (36.8 C) 98.3 F (36.8 C) 98.2 F (36.8 C)  TempSrc:  Oral Oral Oral  SpO2: 93% 94% 95% 95%  Weight:  79.8 kg (175 lb 14.8 oz)      General: Pt is alert, awake, not in acute distress Cardiovascular: RRR, S1/S2 +, no rubs, no gallops Respiratory: CTA bilaterally, no wheezing, no rhonchi Abdominal: Soft, NT, ND, bowel sounds + Extremities: no edema, no cyanosis   The results of significant diagnostics from this hospitalization (including imaging, microbiology, ancillary and laboratory) are listed below for reference.    Significant Diagnostic  Studies: Mr Brain 53 Contrast  Result Date: 04/24/2016 CLINICAL DATA:  75 year old male awoke with dizziness. Initial encounter. EXAM: MRI HEAD WITHOUT CONTRAST TECHNIQUE: Multiplanar, multiecho pulse sequences of the brain and surrounding structures were obtained without intravenous contrast. COMPARISON:  None. FINDINGS: Brain: No acute infarct for intracranial hemorrhage. Very mild chronic microvascular changes. Mild global atrophy without hydrocephalus. No intracranial mass lesion noted on this unenhanced exam. Vascular: Major intracranial vascular structures are patent. Skull and upper cervical spine: Slightly heterogeneous clivus without mass identified. Minimal prominence of the transverse ligament. Sinuses/Orbits: Post right lens replacement. No acute orbital abnormality. Minimal mucosal thickening ethmoid sinus air cells. Other: Negative. IMPRESSION: No acute infarct or intracranial hemorrhage. Very mild chronic microvascular changes. Mild global atrophy without hydrocephalus. Electronically Signed   By: Genia Del M.D.   On: 04/24/2016 12:58     Microbiology: No results found for this or any previous visit (from the past 240 hour(s)).   Labs: Basic Metabolic Panel:  Recent Labs Lab 04/24/16 1005 04/25/16 0623  NA 135 138  K 4.1 4.1  CL 104 105  CO2 24 28  GLUCOSE 167* 100*  BUN 20 13  CREATININE 1.08 1.19  CALCIUM 9.6 9.2   Liver Function Tests:  Recent Labs Lab 04/24/16 1005  AST 21  ALT 18  ALKPHOS 48  BILITOT 0.8  PROT 6.8  ALBUMIN 4.0   No results for input(s): LIPASE, AMYLASE in the last  168 hours. No results for input(s): AMMONIA in the last 168 hours. CBC:  Recent Labs Lab 04/24/16 1005 04/25/16 0623  WBC 9.0 13.5*  NEUTROABS 7.9*  --   HGB 15.7 14.1  HCT 47.6 43.4  MCV 88.1 87.1  PLT 233 233   Cardiac Enzymes: No results for input(s): CKTOTAL, CKMB, CKMBINDEX, TROPONINI in the last 168 hours. BNP: Invalid input(s): POCBNP CBG:  Recent  Labs Lab 04/24/16 2125 04/25/16 0442 04/25/16 0813  GLUCAP 204* 92 92    Time coordinating discharge:  Greater than 30 minutes  Signed:  Jevaun Strick, DO [facility] Pager: [phone] 04/25/2016, 11:27 AM

## 2016-04-25 NOTE — Care Management Note (Signed)
Case Management Note  Patient Details  Name: Richard Frederick MRN: [id number] Date of Birth: January 27, 1941  Subjective/Objective:                 Independent patient from home with wife in obs <24 hours for vertigo. CM provided with list of [facility] outpatient rehab centers to follow up with Vestibular PT if desired. Patient and wife verbalized understanding.  No PT note as of yet, DC ordered. No DME required, No other CM needs identified.   Action/Plan:  Dc to home self care.  Expected Discharge Date:                  Expected Discharge Plan:  Home/Self Care  In-House Referral:  NA  Discharge planning Services  CM Consult  Post Acute Care Choice:  NA Choice offered to:  NA  DME Arranged:  N/A DME Agency:  NA  HH Arranged:  NA HH Agency:  NA  Status of Service:  Completed, signed off  If discussed at [place] of Stay Meetings, dates discussed:    Additional Comments:  Carles Collet, RN 04/25/2016, 12:04 PM

## 2016-04-25 NOTE — Evaluation (Signed)
Physical Therapy Evaluation Patient Details Name: Richard Frederick MRN: [id number] DOB: 06/23/1941 Today's Date: 04/25/2016   History of Present Illness  pt is a 75 y/o male with h/o HTN DM2 and remote h/o vertigo, admitted with dizziness of a spinning nature, nausea and vomiting.  S/S have lessened but continue and appear to be BPPV.  Clinical Impression  Pt still experiencing mild vertiginous symptom, but much less.  Vestibular testing was all negative except scanning tasks producing dysequilibrium that remained after stopping for a short period of time.  As MD suggested, I recommend f/u with EENT if symptoms continue after 7-10 day.  I suspect this is a neuritis.    Follow Up Recommendations No PT follow up    Equipment Recommendations  None recommended by PT    Recommendations for Other Services       Precautions / Restrictions Precautions Precautions: Fall      Mobility  Bed Mobility Overal bed mobility: Independent                Transfers Overall transfer level: Independent                  Ambulation/Gait Ambulation/Gait assistance: Supervision   Assistive device: None Gait Pattern/deviations: Step-through pattern   Gait velocity interpretation: at or above normal speed for age/gender General Gait Details: Steady gait when not scanning, but moderate deviation with scanning L to R and staggering with scanning/nodding up/down.  Vertiginous symptoms remained with mild dysequilibrium after this activity.  Stairs Stairs: Yes Stairs assistance: Supervision Stair Management: One rail Right;Alternating pattern;Step to pattern;Forwards;Sideways Number of Stairs: 5 General stair comments: pt less confident coming down, going sideways, 2 hands to the rail and 2 feet to each step.  Wheelchair Mobility    Modified Rankin (Stroke Patients Only)       Balance Overall balance assessment: Needs assistance   Sitting balance-Leahy Scale: Good        Standing balance-Leahy Scale: Good                               Pertinent Vitals/Pain Pain Assessment: No/denies pain    Home Living Family/patient expects to be discharged to:: Private residence Living Arrangements: Spouse/significant other Available Help at Discharge: Family Type of Home: House         Home Equipment: None      Prior Function Level of Independence: Independent               Hand Dominance        Extremity/Trunk Assessment               Lower Extremity Assessment: Overall WFL for tasks assessed         Communication   Communication: No difficulties  Cognition Arousal/Alertness: Awake/alert Behavior During Therapy: WFL for tasks assessed/performed Overall Cognitive Status: Within Functional Limits for tasks assessed                      General Comments General comments (skin integrity, edema, etc.): Vestibular assessment.  Answers to questions suggest BPPV, but testing with Hallpike-Dix and supine head roll produced negative results..  Oculomotor exam also generally negative, but pt having difficulty completing VOR, likely due to an imbalance.  Suspect some type of neuritis and suggested to wife and pt that they follow up with EENT if s/s aren't alleviated in a week or so.    Exercises  Assessment/Plan    PT Assessment  (all further intervention by EENT if S/s persist.)  PT Problem List            PT Treatment Interventions      PT Goals (Current goals can be found in the Care Plan section)       Frequency     Barriers to discharge        Co-evaluation               End of Session   Activity Tolerance: Patient tolerated treatment well Patient left: in bed;with family/visitor present;with call bell/phone within reach Nurse Communication: Mobility status    Functional Assessment Tool Used: clinical judgement Functional Limitation: Mobility: Walking and moving around Mobility:  Walking and Moving Around Current Status [id number]): At least 1 percent but less than 20 percent impaired, limited or restricted Mobility: Walking and Moving Around Goal Status [phone]): At least 1 percent but less than 20 percent impaired, limited or restricted Mobility: Walking and Moving Around Discharge Status [phone]): At least 1 percent but less than 20 percent impaired, limited or restricted    Time: 1220-1259 PT Time Calculation (min) (ACUTE ONLY): 39 min   Charges:   PT Evaluation $PT Eval Moderate Complexity: 1 Procedure PT Treatments $Therapeutic Activity: 8-22 mins $Neuromuscular Re-education: 8-22 mins   PT G Codes:   PT G-Codes **NOT FOR INPATIENT CLASS** Functional Assessment Tool Used: clinical judgement Functional Limitation: Mobility: Walking and moving around Mobility: Walking and Moving Around Current Status [id number]): At least 1 percent but less than 20 percent impaired, limited or restricted Mobility: Walking and Moving Around Goal Status [phone]): At least 1 percent but less than 20 percent impaired, limited or restricted Mobility: Walking and Moving Around Discharge Status [phone]): At least 1 percent but less than 20 percent impaired, limited or restricted    Lamyah Creed, Tessie Fass 04/25/2016, 1:17 PM  04/25/2016  Donnella Sham, [place] [phone]  (pager)

## 2017-03-19 ENCOUNTER — Encounter (INDEPENDENT_AMBULATORY_CARE_PROVIDER_SITE_OTHER): Payer: Medicare Other | Admitting: Ophthalmology

## 2017-03-19 DIAGNOSIS — H43813 Vitreous degeneration, bilateral: Secondary | ICD-10-CM | POA: Diagnosis not present

## 2017-03-19 DIAGNOSIS — H35033 Hypertensive retinopathy, bilateral: Secondary | ICD-10-CM

## 2017-03-19 DIAGNOSIS — H353132 Nonexudative age-related macular degeneration, bilateral, intermediate dry stage: Secondary | ICD-10-CM

## 2017-03-19 DIAGNOSIS — I10 Essential (primary) hypertension: Secondary | ICD-10-CM | POA: Diagnosis not present

## 2018-03-20 ENCOUNTER — Encounter (INDEPENDENT_AMBULATORY_CARE_PROVIDER_SITE_OTHER): Payer: Medicare Other | Admitting: Ophthalmology

## 2018-03-20 DIAGNOSIS — H43813 Vitreous degeneration, bilateral: Secondary | ICD-10-CM

## 2018-03-20 DIAGNOSIS — H353121 Nonexudative age-related macular degeneration, left eye, early dry stage: Secondary | ICD-10-CM | POA: Diagnosis not present

## 2018-03-20 DIAGNOSIS — I10 Essential (primary) hypertension: Secondary | ICD-10-CM

## 2018-03-20 DIAGNOSIS — H35033 Hypertensive retinopathy, bilateral: Secondary | ICD-10-CM | POA: Diagnosis not present

## 2018-03-20 DIAGNOSIS — H353112 Nonexudative age-related macular degeneration, right eye, intermediate dry stage: Secondary | ICD-10-CM

## 2018-03-20 DIAGNOSIS — H2512 Age-related nuclear cataract, left eye: Secondary | ICD-10-CM

## 2018-06-23 ENCOUNTER — Emergency Department (HOSPITAL_COMMUNITY)
Admission: EM | Admit: 2018-06-23 | Discharge: 2018-06-24 | Disposition: A | Discharge status: Home / Self Care | Payer: Medicare Other | Attending: Emergency Medicine | Admitting: Emergency Medicine

## 2018-06-23 ENCOUNTER — Encounter (HOSPITAL_COMMUNITY): Payer: Self-pay

## 2018-06-23 ENCOUNTER — Emergency Department (HOSPITAL_COMMUNITY): Payer: Medicare Other

## 2018-06-23 DIAGNOSIS — H579 Unspecified disorder of eye and adnexa: Secondary | ICD-10-CM | POA: Insufficient documentation

## 2018-06-23 DIAGNOSIS — I10 Essential (primary) hypertension: Secondary | ICD-10-CM | POA: Insufficient documentation

## 2018-06-23 DIAGNOSIS — Z7984 Long term (current) use of oral hypoglycemic drugs: Secondary | ICD-10-CM | POA: Insufficient documentation

## 2018-06-23 DIAGNOSIS — E119 Type 2 diabetes mellitus without complications: Secondary | ICD-10-CM | POA: Insufficient documentation

## 2018-06-23 DIAGNOSIS — Z87891 Personal history of nicotine dependence: Secondary | ICD-10-CM | POA: Diagnosis not present

## 2018-06-23 DIAGNOSIS — Z7982 Long term (current) use of aspirin: Secondary | ICD-10-CM | POA: Diagnosis not present

## 2018-06-23 DIAGNOSIS — Z79899 Other long term (current) drug therapy: Secondary | ICD-10-CM | POA: Diagnosis not present

## 2018-06-23 LAB — BASIC METABOLIC PANEL
Anion gap: 9 (ref 5–15)
BUN: 17 mg/dL (ref 8–23)
CHLORIDE: 102 mmol/L (ref 98–111)
CO2: 24 mmol/L (ref 22–32)
Calcium: 9.7 mg/dL (ref 8.9–10.3)
Creatinine, Ser: 1.15 mg/dL (ref 0.61–1.24)
GFR, EST NON AFRICAN AMERICAN: 59 mL/min — AB (ref >60)
Glucose, Bld: 96 mg/dL (ref 70–99)
POTASSIUM: 3.8 mmol/L (ref 3.5–5.1)
SODIUM: 135 mmol/L (ref 135–145)

## 2018-06-23 LAB — CBC
HCT: 47.8 % (ref 39.0–52.0)
HEMOGLOBIN: 15.1 g/dL (ref 13.0–17.0)
MCH: 27.9 pg (ref 26.0–34.0)
MCHC: 31.6 g/dL (ref 30.0–36.0)
MCV: 88.2 fL (ref 80.0–100.0)
PLATELETS: 243 10*3/uL (ref 150–400)
RBC: 5.42 MIL/uL (ref 4.22–5.81)
RDW: 13.4 % (ref 11.5–15.5)
WBC: 9.7 10*3/uL (ref 4.0–10.5)
nRBC: 0 % (ref 0.0–0.2)

## 2018-06-23 LAB — SEDIMENTATION RATE: Sed Rate: 6 mm/hr (ref 0–16)

## 2018-06-23 LAB — C-REACTIVE PROTEIN

## 2018-06-23 NOTE — ED Triage Notes (Signed)
Pt c/o changes to his peripheral vision that he first noted this morning while driving, denies blurred vision at this time, only notices this during movement, went to Dr. Katy Fitch with eye complaint and sent here to r/o possible CVA
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
## 2018-06-23 NOTE — Discharge Instructions (Addendum)
It was our pleasure to provide your ER care today - we hope that you feel better.  Take an enteric coated aspirin a day.  Follow up with neurologist in the next 1-2 weeks - see referral - call office tomorrow to arrange appointment.   Also follow up with primary care doctor in the next 1-2 weeks - have your blood pressure rechecked then as it was high tonight.  Return to ER if worse, new symptoms, one-sided numbness or weakness, change in speech or vision, other concern.

## 2018-06-23 NOTE — ED Notes (Signed)
Pt in MRI at this time 

## 2018-06-23 NOTE — ED Notes (Signed)
Per MRI pt is currently 4th in line for testing. Pt and family aware
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
## 2018-07-01 ENCOUNTER — Ambulatory Visit (INDEPENDENT_AMBULATORY_CARE_PROVIDER_SITE_OTHER): Payer: Medicare Other | Admitting: Neurology

## 2018-07-01 ENCOUNTER — Encounter: Payer: Self-pay | Admitting: Neurology

## 2018-07-01 VITALS — Ht 67.0 in | Wt 192.0 lb

## 2018-07-01 DIAGNOSIS — H4921 Sixth [abducent] nerve palsy, right eye: Secondary | ICD-10-CM | POA: Diagnosis not present

## 2018-07-01 DIAGNOSIS — R413 Other amnesia: Secondary | ICD-10-CM

## 2018-07-01 MED ORDER — CLOPIDOGREL BISULFATE 75 MG PO TABS
75.0000 mg | ORAL_TABLET | Freq: Every day | ORAL | 11 refills | Status: DC
Start: 2018-07-01 — End: 2019-06-29
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
## 2018-07-03 LAB — ACETYLCHOLINE RECEPTOR, BINDING: AChR Binding Ab, Serum: <0.03 nmol/L (ref 0.00–0.24)

## 2018-07-03 LAB — ANA W/REFLEX: Anti Nuclear Antibody(ANA): NEGATIVE

## 2018-07-03 LAB — VITAMIN B12

## 2018-07-03 LAB — TSH: TSH: 3.46 u[IU]/mL (ref 0.450–4.500)

## 2018-07-03 LAB — RPR: RPR Ser Ql: NONREACTIVE

## 2018-07-04 DIAGNOSIS — R413 Other amnesia: Secondary | ICD-10-CM | POA: Insufficient documentation

## 2018-07-18 ENCOUNTER — Ambulatory Visit (HOSPITAL_COMMUNITY)
Admission: RE | Admit: 2018-07-18 | Discharge: 2018-07-18 | Disposition: A | Discharge status: Home / Self Care | Payer: Medicare Other | Source: Ambulatory Visit | Attending: Neurology | Admitting: Neurology

## 2018-07-18 ENCOUNTER — Ambulatory Visit (HOSPITAL_BASED_OUTPATIENT_CLINIC_OR_DEPARTMENT_OTHER)
Admission: RE | Admit: 2018-07-18 | Discharge: 2018-07-18 | Disposition: A | Discharge status: Home / Self Care | Payer: Medicare Other | Source: Ambulatory Visit | Attending: Neurology | Admitting: Neurology

## 2018-07-18 DIAGNOSIS — E119 Type 2 diabetes mellitus without complications: Secondary | ICD-10-CM | POA: Diagnosis not present

## 2018-07-18 DIAGNOSIS — I6523 Occlusion and stenosis of bilateral carotid arteries: Secondary | ICD-10-CM | POA: Insufficient documentation

## 2018-07-18 DIAGNOSIS — H4921 Sixth [abducent] nerve palsy, right eye: Secondary | ICD-10-CM | POA: Insufficient documentation

## 2018-07-18 DIAGNOSIS — I10 Essential (primary) hypertension: Secondary | ICD-10-CM | POA: Diagnosis not present

## 2018-07-18 NOTE — Progress Notes (Signed)
Carotid duplex completed - prelminary results in [place], RVS 12/202019 3:17 PM

## 2018-07-18 NOTE — Progress Notes (Signed)
  Echocardiogram 2D Echocardiogram has been performed.  Richard Frederick 07/18/2018, 2:37 PM

## 2019-03-24 ENCOUNTER — Encounter (INDEPENDENT_AMBULATORY_CARE_PROVIDER_SITE_OTHER): Payer: Medicare Other | Admitting: Ophthalmology

## 2019-04-01 ENCOUNTER — Encounter (INDEPENDENT_AMBULATORY_CARE_PROVIDER_SITE_OTHER): Payer: Medicare Other | Admitting: Ophthalmology

## 2019-04-14 ENCOUNTER — Other Ambulatory Visit: Payer: Self-pay

## 2019-04-14 ENCOUNTER — Encounter (INDEPENDENT_AMBULATORY_CARE_PROVIDER_SITE_OTHER): Payer: Medicare Other | Admitting: Ophthalmology

## 2019-04-14 DIAGNOSIS — H43813 Vitreous degeneration, bilateral: Secondary | ICD-10-CM | POA: Diagnosis not present

## 2019-04-14 DIAGNOSIS — H353132 Nonexudative age-related macular degeneration, bilateral, intermediate dry stage: Secondary | ICD-10-CM | POA: Diagnosis not present

## 2019-04-14 DIAGNOSIS — I10 Essential (primary) hypertension: Secondary | ICD-10-CM

## 2019-04-14 DIAGNOSIS — H35033 Hypertensive retinopathy, bilateral: Secondary | ICD-10-CM

## 2019-06-29 ENCOUNTER — Other Ambulatory Visit: Payer: Self-pay | Admitting: Neurology

## 2019-08-24 ENCOUNTER — Ambulatory Visit: Payer: Medicare Other | Attending: Internal Medicine

## 2019-08-24 DIAGNOSIS — Z23 Encounter for immunization: Secondary | ICD-10-CM

## 2019-08-24 NOTE — Progress Notes (Signed)
   Covid-19 Vaccination Clinic  Name:  Richard Frederick    MRN: [id number] DOB: 1940-11-17  08/24/2019  Mr. Guarnieri was observed post Covid-19 immunization for 30 minutes based on pre-vaccination screening without incidence. He was provided with Vaccine Information Sheet and instruction to access the V-Safe system.   Mr. Skahan was instructed to call 911 with any severe reactions post vaccine: Marland Kitchen Difficulty breathing  . Swelling of your face and throat  . A fast heartbeat  . A bad rash all over your body  . Dizziness and weakness    Immunizations Administered    Name Date Dose VIS Date Route   Pfizer COVID-19 Vaccine 08/24/2019 11:24 AM 0.3 mL 07/10/2019 Intramuscular   Manufacturer: [place]   Lot: [id number]   : [id number]

## 2019-09-14 ENCOUNTER — Ambulatory Visit: Payer: Medicare Other | Attending: Internal Medicine

## 2019-09-14 DIAGNOSIS — Z23 Encounter for immunization: Secondary | ICD-10-CM

## 2019-09-14 NOTE — Progress Notes (Signed)
   Covid-19 Vaccination Clinic  Name:  Richard Frederick    MRN: [id number] DOB: 03-07-1941  09/14/2019  Richard Frederick was observed post Covid-19 immunization for 15 minutes without incidence. He was provided with Vaccine Information Sheet and instruction to access the V-Safe system.   Richard Frederick was instructed to call 911 with any severe reactions post vaccine: Marland Kitchen Difficulty breathing  . Swelling of your face and throat  . A fast heartbeat  . A bad rash all over your body  . Dizziness and weakness    Immunizations Administered    Name Date Dose VIS Date Route   Pfizer COVID-19 Vaccine 09/14/2019 11:36 AM 0.3 mL 07/10/2019 Intramuscular   Manufacturer: [place]   Lot: [id number]   [place]: [id number]

## 2019-09-22 ENCOUNTER — Other Ambulatory Visit: Payer: Self-pay | Admitting: Neurology

## 2020-04-13 ENCOUNTER — Encounter (INDEPENDENT_AMBULATORY_CARE_PROVIDER_SITE_OTHER): Payer: Medicare Other | Admitting: Ophthalmology

## 2020-04-13 ENCOUNTER — Other Ambulatory Visit: Payer: Self-pay

## 2020-04-13 DIAGNOSIS — I10 Essential (primary) hypertension: Secondary | ICD-10-CM

## 2020-04-13 DIAGNOSIS — H43813 Vitreous degeneration, bilateral: Secondary | ICD-10-CM

## 2020-04-13 DIAGNOSIS — H353132 Nonexudative age-related macular degeneration, bilateral, intermediate dry stage: Secondary | ICD-10-CM | POA: Diagnosis not present

## 2020-04-13 DIAGNOSIS — H35033 Hypertensive retinopathy, bilateral: Secondary | ICD-10-CM

## 2020-10-13 IMAGING — MR MR HEAD W/O CM
10 of 11 series · 42 of 48 positions shown · non-contrast
Comparison: Brain MRI 04/24/2016

CLINICAL DATA: Peripheral vision changes.

EXAM:
MRI HEAD WITHOUT CONTRAST
TECHNIQUE: Multiplanar, multiecho pulse sequences of the brain and surrounding
structures were obtained without intravenous contrast.

[Series 5: DWI · axial · 3.0mm · 0.88mm/px · z∈[-57,+77]mm · 9 of 92 slices shown (1 of 4)]
[im 1/92]
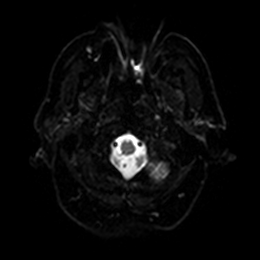
[im 12/92]
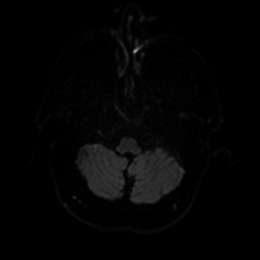
[im 23/92]
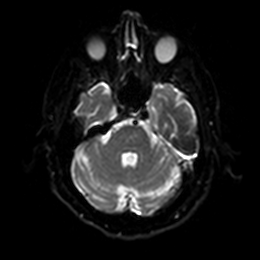
[im 35/92]
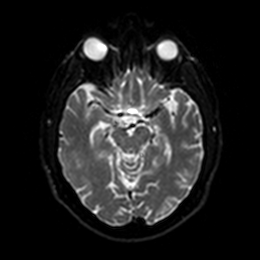
[im 46/92]
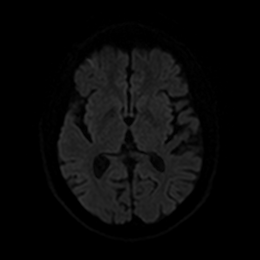
[im 57/92]
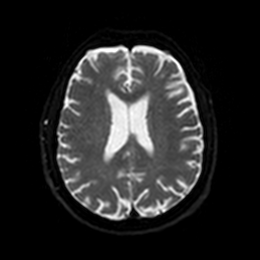
[im 69/92]
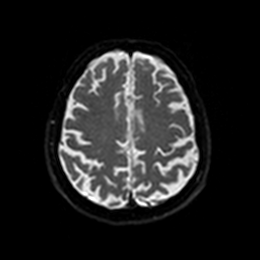
[im 80/92]
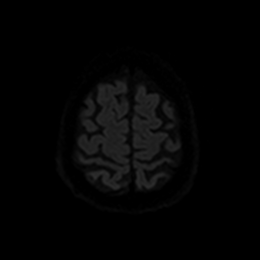
[im 92/92]
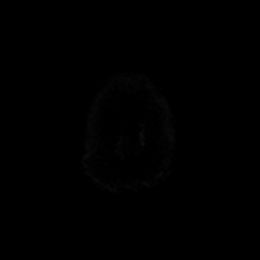

[Series 6: DWI · axial · 3.0mm · 0.88mm/px · z∈[-57,+77]mm · 4 of 46 slices shown (2 of 4)]
[im 1/46]
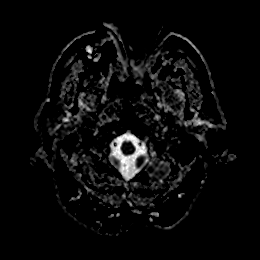
[im 16/46]
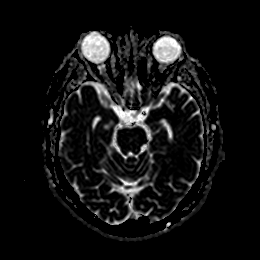
[im 31/46]
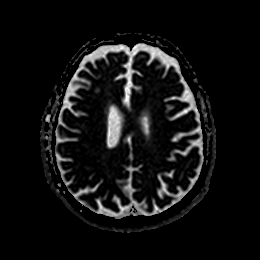
[im 46/46]
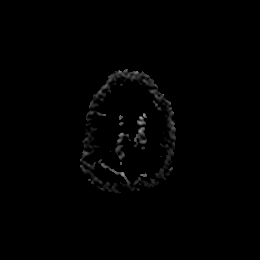

[Series 7: DWI · coronal · 4.0mm · 0.88mm/px · 6 of 70 slices shown (3 of 4)]
[im 1/70]
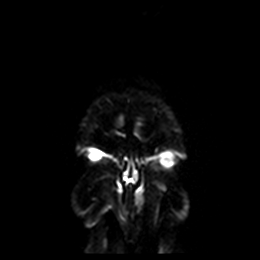
[im 14/70]
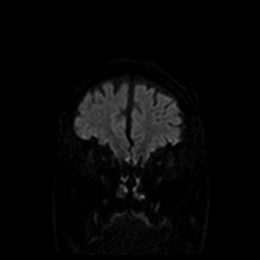
[im 28/70]
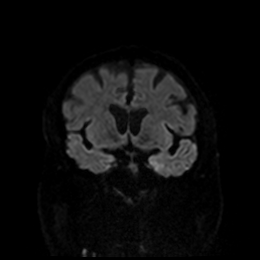
[im 42/70]
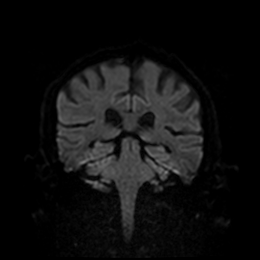
[im 56/70]
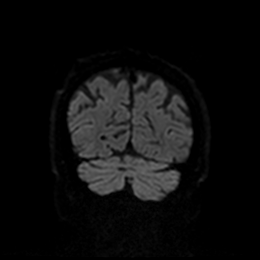
[im 70/70]
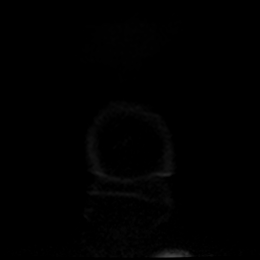

[Series 8: DWI · coronal · 4.0mm · 0.88mm/px · 3 of 35 slices shown (4 of 4)]
[im 1/35]
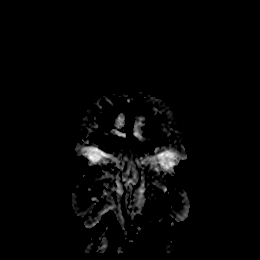
[im 18/35]
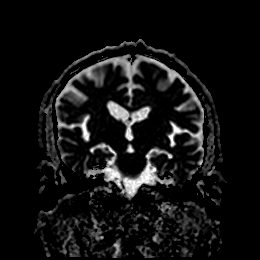
[im 35/35]
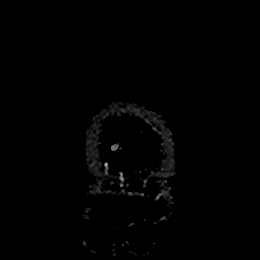

[Series 9: T1 · sagittal · 5.0mm · 0.75mm/px · 2 of 24 slices shown]
[im 1/24]
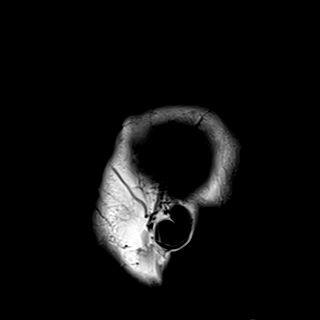
[im 24/24]
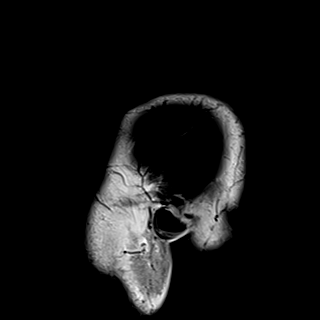

[Series 10: T2 · axial · 5.0mm · 0.72mm/px · z∈[-66,+77]mm · 2 of 25 slices shown (1 of 2)]
[im 1/25]
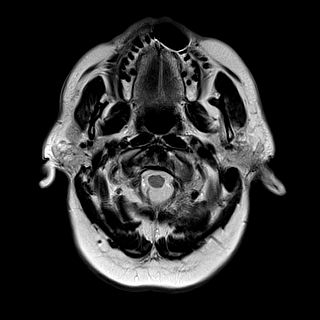
[im 25/25]
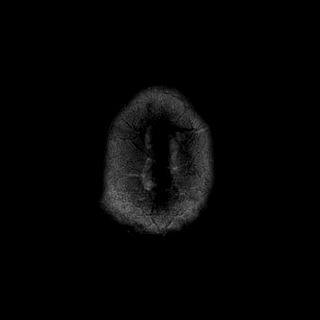

[Series 11: FLAIR · axial · 5.0mm · 0.45mm/px · z∈[-66,+77]mm · 2 of 25 slices shown]
[im 1/25]
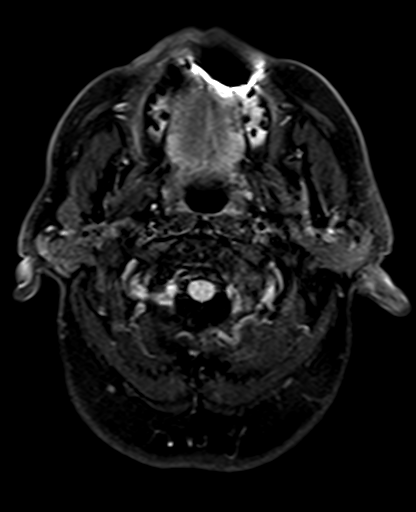
[im 25/25]
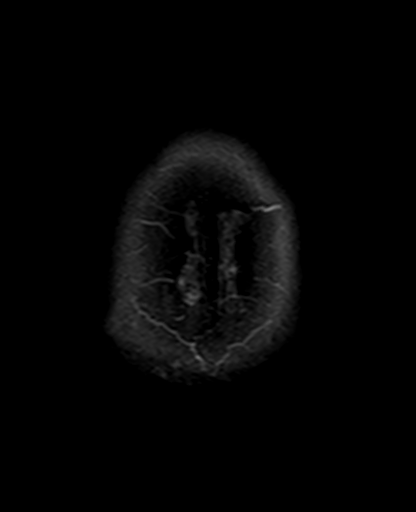

[Series 12: swi_images · axial · 3.0mm · 0.90mm/px · z∈[-77,+99]mm · 6 of 60 slices shown]
[im 1/60]
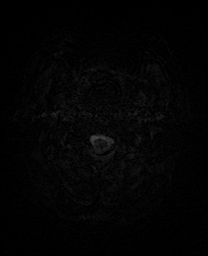
[im 12/60]
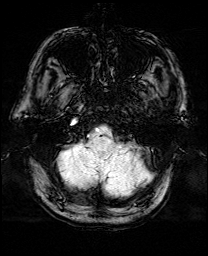
[im 24/60]
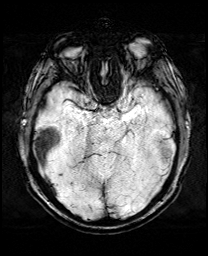
[im 36/60]
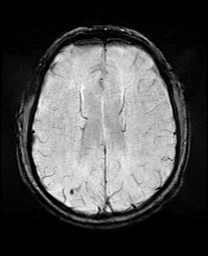
[im 48/60]
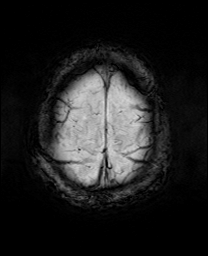
[im 60/60]
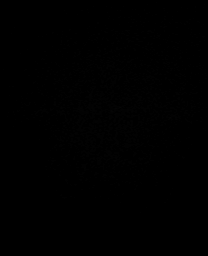

[Series 13: mip_images(sw) · axial · 24.0mm · 0.90mm/px · z∈[-67,+88]mm · 5 of 53 slices shown]
[im 1/53]
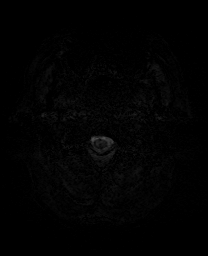
[im 14/53]
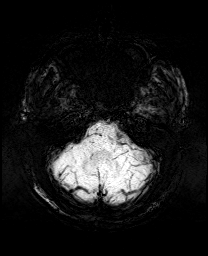
[im 27/53]
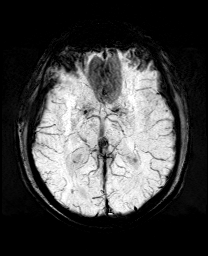
[im 40/53]
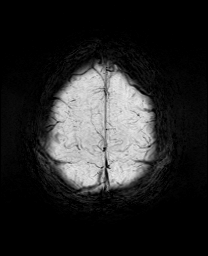
[im 53/53]
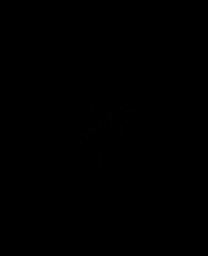

[Series 15: T2 · coronal · 5.0mm · 0.34mm/px · 3 of 29 slices shown (2 of 2)]
[im 1/29]
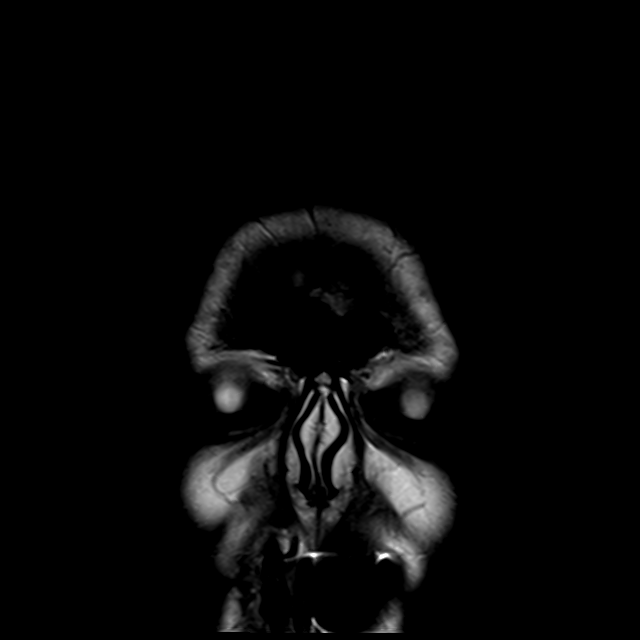
[im 15/29]
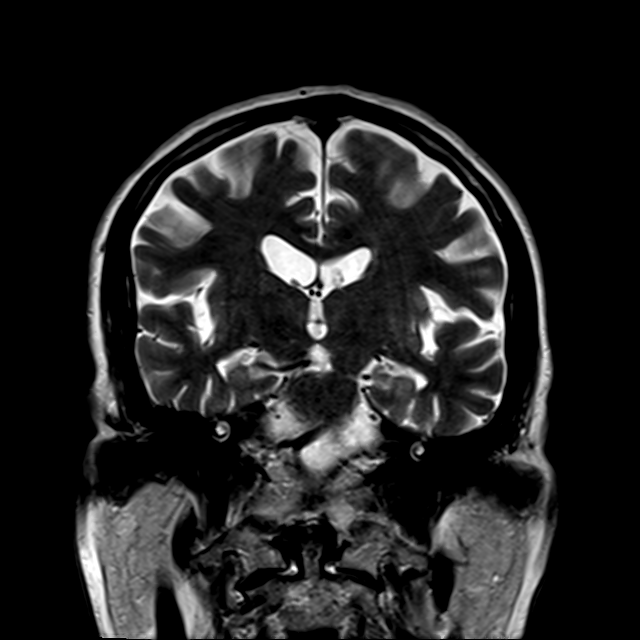
[im 29/29]
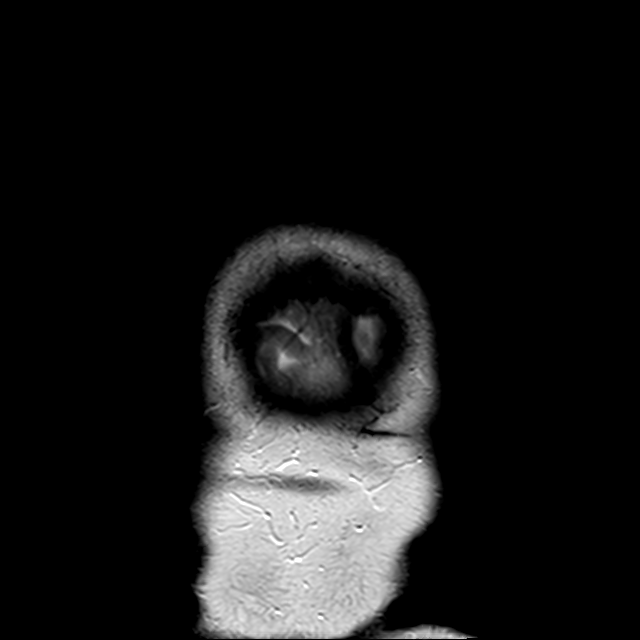

[42 of 48 positions shown; findings below may reference images not displayed]

FINDINGS: BRAIN: There is no acute infarct, acute hemorrhage, hydrocephalus or
extra-axial collection. The midline structures are normal. No
midline shift or other mass effect. There are no old infarcts. The
white matter signal is normal for the patient's age. The cerebral
and cerebellar volume are age-appropriate. Single focus of chronic
microhemorrhage in the right occipital lobe.

VASCULAR: Major intracranial arterial and venous sinus flow voids
are normal.

SKULL AND UPPER CERVICAL SPINE: Calvarial bone marrow signal is
normal. There is no skull base mass. Visualized upper cervical spine
and soft tissues are normal.

SINUSES/ORBITS: No fluid levels or advanced mucosal thickening. No
mastoid or middle ear effusion. The orbits are normal.
IMPRESSION: 1. No acute intracranial abnormality.
2. Mild chronic microvascular disease.

## 2021-04-13 ENCOUNTER — Encounter (INDEPENDENT_AMBULATORY_CARE_PROVIDER_SITE_OTHER): Payer: Medicare Other | Admitting: Ophthalmology

## 2021-04-13 ENCOUNTER — Other Ambulatory Visit: Payer: Self-pay

## 2021-04-13 DIAGNOSIS — H353132 Nonexudative age-related macular degeneration, bilateral, intermediate dry stage: Secondary | ICD-10-CM | POA: Diagnosis not present

## 2021-04-13 DIAGNOSIS — H43813 Vitreous degeneration, bilateral: Secondary | ICD-10-CM | POA: Diagnosis not present

## 2021-04-13 DIAGNOSIS — I10 Essential (primary) hypertension: Secondary | ICD-10-CM

## 2021-04-13 DIAGNOSIS — H2701 Aphakia, right eye: Secondary | ICD-10-CM

## 2021-04-13 DIAGNOSIS — H35033 Hypertensive retinopathy, bilateral: Secondary | ICD-10-CM | POA: Diagnosis not present

## 2021-04-13 DIAGNOSIS — H2512 Age-related nuclear cataract, left eye: Secondary | ICD-10-CM

## 2022-04-19 ENCOUNTER — Encounter (INDEPENDENT_AMBULATORY_CARE_PROVIDER_SITE_OTHER): Payer: Medicare Other | Admitting: Ophthalmology

## 2022-04-26 ENCOUNTER — Encounter: Payer: Self-pay | Admitting: Podiatry

## 2022-04-26 ENCOUNTER — Ambulatory Visit: Payer: Medicare Other | Admitting: Podiatry

## 2022-04-26 DIAGNOSIS — B351 Tinea unguium: Secondary | ICD-10-CM | POA: Diagnosis not present

## 2022-04-26 DIAGNOSIS — L600 Ingrowing nail: Secondary | ICD-10-CM

## 2022-04-26 NOTE — Progress Notes (Signed)
Subjective:   Patient ID: Richard Frederick, male   DOB: 81 y.o.   MRN: [id number]   HPI Patient presents with caregiver with a severely thickened deformed right big toenail and a bunch of other nails that have some mild discoloration elongation.  He does have dementia and is not very communicative but I am able to communicate with him and wife and patient does not smoke likes to be active   Review of Systems  All other systems reviewed and are negative.       Objective:  Physical Exam Vitals and nursing note reviewed.  Constitutional:      Appearance: He is well-developed.  Pulmonary:     Effort: Pulmonary effort is normal.  Musculoskeletal:        General: Normal range of motion.  Skin:    General: Skin is warm.  Neurological:     Mental Status: He is alert.     Neurovascular status intact muscle strength adequate range of motion intact with patient found to have a severely deformed right hallux nail that is painful when pressed dorsally with patient found to have adjacent nails that have elongation slight discoloration.  The nail itself is sore patient has good digital perfusion well oriented x3     Assessment:  Severe damage right hallux nail with pain and adjacent nails with possible fungal infiltration     Plan:  18 P educated him and caregiver on the cause of the problem and the damage to the right nail recommended removal and this is what they want and are motivated to have it done.  I allowed him to reviewed and signed consent form understanding the risk and I infiltrated the right hallux 60 mg like Marcaine mixture sterile prep was done and using sterile instrumentation remove the hallux nail exposed matrix applied phenol 5 applications 30 seconds followed by alcohol lavage sterile dressing given instructions on soaks and leave dressing on 24 hours taken earlier throbbing were to occur and encouraged to call questions concerns

## 2022-04-26 NOTE — Patient Instructions (Signed)

## 2022-11-22 DIAGNOSIS — K080 Exfoliation of teeth due to systemic causes: Secondary | ICD-10-CM | POA: Diagnosis not present

## 2022-12-17 DIAGNOSIS — E119 Type 2 diabetes mellitus without complications: Secondary | ICD-10-CM | POA: Diagnosis not present

## 2022-12-17 DIAGNOSIS — F0390 Unspecified dementia without behavioral disturbance: Secondary | ICD-10-CM | POA: Diagnosis not present

## 2022-12-17 DIAGNOSIS — F39 Unspecified mood [affective] disorder: Secondary | ICD-10-CM | POA: Diagnosis not present

## 2022-12-17 DIAGNOSIS — R350 Frequency of micturition: Secondary | ICD-10-CM | POA: Diagnosis not present

## 2022-12-17 DIAGNOSIS — I10 Essential (primary) hypertension: Secondary | ICD-10-CM | POA: Diagnosis not present

## 2023-01-08 DIAGNOSIS — I10 Essential (primary) hypertension: Secondary | ICD-10-CM | POA: Diagnosis not present

## 2023-02-20 DIAGNOSIS — H43812 Vitreous degeneration, left eye: Secondary | ICD-10-CM | POA: Diagnosis not present

## 2023-02-20 DIAGNOSIS — E119 Type 2 diabetes mellitus without complications: Secondary | ICD-10-CM | POA: Diagnosis not present

## 2023-02-20 DIAGNOSIS — H2512 Age-related nuclear cataract, left eye: Secondary | ICD-10-CM | POA: Diagnosis not present

## 2023-02-20 DIAGNOSIS — H353111 Nonexudative age-related macular degeneration, right eye, early dry stage: Secondary | ICD-10-CM | POA: Diagnosis not present

## 2023-05-28 DIAGNOSIS — H524 Presbyopia: Secondary | ICD-10-CM | POA: Diagnosis not present

## 2023-06-17 DIAGNOSIS — K080 Exfoliation of teeth due to systemic causes: Secondary | ICD-10-CM | POA: Diagnosis not present

## 2023-06-24 DIAGNOSIS — Z23 Encounter for immunization: Secondary | ICD-10-CM | POA: Diagnosis not present

## 2023-06-24 DIAGNOSIS — Z1331 Encounter for screening for depression: Secondary | ICD-10-CM | POA: Diagnosis not present

## 2023-06-24 DIAGNOSIS — F39 Unspecified mood [affective] disorder: Secondary | ICD-10-CM | POA: Diagnosis not present

## 2023-06-24 DIAGNOSIS — N400 Enlarged prostate without lower urinary tract symptoms: Secondary | ICD-10-CM | POA: Diagnosis not present

## 2023-06-24 DIAGNOSIS — E1122 Type 2 diabetes mellitus with diabetic chronic kidney disease: Secondary | ICD-10-CM | POA: Diagnosis not present

## 2023-06-24 DIAGNOSIS — E119 Type 2 diabetes mellitus without complications: Secondary | ICD-10-CM | POA: Diagnosis not present

## 2023-06-24 DIAGNOSIS — Z0000 Encounter for general adult medical examination without abnormal findings: Secondary | ICD-10-CM | POA: Diagnosis not present

## 2023-06-24 DIAGNOSIS — E7800 Pure hypercholesterolemia, unspecified: Secondary | ICD-10-CM | POA: Diagnosis not present

## 2023-06-24 DIAGNOSIS — Z79899 Other long term (current) drug therapy: Secondary | ICD-10-CM | POA: Diagnosis not present

## 2023-06-24 DIAGNOSIS — F0390 Unspecified dementia without behavioral disturbance: Secondary | ICD-10-CM | POA: Diagnosis not present

## 2023-07-09 ENCOUNTER — Ambulatory Visit: Payer: Medicare Other | Admitting: Podiatry

## 2023-07-09 ENCOUNTER — Encounter: Payer: Self-pay | Admitting: Podiatry

## 2023-07-09 DIAGNOSIS — M79675 Pain in left toe(s): Secondary | ICD-10-CM | POA: Diagnosis not present

## 2023-07-09 DIAGNOSIS — M2041 Other hammer toe(s) (acquired), right foot: Secondary | ICD-10-CM | POA: Diagnosis not present

## 2023-07-09 DIAGNOSIS — M2042 Other hammer toe(s) (acquired), left foot: Secondary | ICD-10-CM

## 2023-07-09 DIAGNOSIS — M79674 Pain in right toe(s): Secondary | ICD-10-CM

## 2023-07-09 DIAGNOSIS — E119 Type 2 diabetes mellitus without complications: Secondary | ICD-10-CM | POA: Diagnosis not present

## 2023-07-09 DIAGNOSIS — B351 Tinea unguium: Secondary | ICD-10-CM

## 2023-07-09 NOTE — Progress Notes (Unsigned)
Subjective:  Patient ID: Richard Frederick, male    DOB: May 24, 1941,  MRN: [id number]  Richard Frederick presents to clinic today for:  Chief Complaint  Patient presents with   [facility]    He is requesting his nails to be trimmed and he is diabetic.   Patient notes nails are thick, discolored, elongated and painful in shoegear when trying to ambulate.  Patient is interested in the diabetic shoe program.  He notes his metformin was cut in half since his A1c has been good.  Denies any numbness in the toes or feet.  PCP is Thana Ates, MD.  Past Medical History:  Diagnosis Date   Anxiety    Hyperlipemia    Hypertension    Type II diabetes mellitus (HCC)    Vision disturbance     Past Surgical History:  Procedure Laterality Date   CATARACT EXTRACTION W/ INTRAOCULAR LENS IMPLANT Right ~ 2013   COLONOSCOPY W/ BIOPSIES AND POLYPECTOMY     COLONOSCOPY WITH PROPOFOL N/A 04/10/2016   Procedure: COLONOSCOPY WITH PROPOFOL;  Surgeon: Charolett Bumpers, MD;  Location: [facility];  Service: Endoscopy;  Laterality: N/A;   TRANSURETHRAL RESECTION OF PROSTATE  early 2000s    Allergies  Allergen Reactions   Amlodipine Other (See Comments)    Edema    Atorvastatin Other (See Comments)    Achy legs   Pravastatin Other (See Comments)    Achy legs   Rosuvastatin Other (See Comments)    Achy legs    Review of Systems: Negative except as noted in the HPI.  Objective:  There were no vitals filed for this visit.  [name] is a pleasant 82 y.o. male in NAD. AAO x 3.  Vascular Examination: Capillary refill time is 3-5 seconds to toes bilateral. Palpable pedal pulses b/l LE. Digital hair present b/l.  Skin temperature gradient WNL b/l. No varicosities b/l. No cyanosis noted b/l.   Dermatological Examination: Pedal skin with normal turgor, texture and tone b/l. No open wounds. No interdigital macerations b/l. Toenails x10 are 3mm thick, discolored, dystrophic with subungual debris.  There is pain with compression of the nail plates.  They are elongated x10  Neurological Examination: Protective sensation intact bilateral LE. Vibratory sensation intact bilateral LE.  Orthopedic examination: There are flexible contractures of the PIP joints of some of the lesser toes bilateral  Assessment/Plan: 1. Pain due to onychomycosis of toenails of both feet   2. Type 2 diabetes mellitus without complication, without long-term current use of insulin (HCC)   3. Hammer toes of both feet    FOR HOME USE ONLY DME DIABETIC SHOE  The mycotic toenails were sharply debrided x10 with sterile nail nippers and a power debriding burr to decrease bulk/thickness and length.    Will get the patient scheduled with our pedorthist for a diabetic shoe consult.  Discussed good foot habits to get into as far as daily foot inspections and calling the office immediately if any signs of infection develop.  Return in about 3 months (around 10/07/2023) for [facility]   Clerance Lav, DPM, FACFAS [facility]     [address], [place] [address]  Office [phone]  Fax [phone]

## 2023-08-02 ENCOUNTER — Ambulatory Visit: Payer: Medicare Other

## 2023-08-02 DIAGNOSIS — M2041 Other hammer toe(s) (acquired), right foot: Secondary | ICD-10-CM

## 2023-08-02 DIAGNOSIS — M2142 Flat foot [pes planus] (acquired), left foot: Secondary | ICD-10-CM

## 2023-08-02 DIAGNOSIS — E119 Type 2 diabetes mellitus without complications: Secondary | ICD-10-CM

## 2023-08-02 NOTE — Progress Notes (Signed)
 Patient presents to the office today for diabetic shoe and insole measuring.  Patient was measured with brannock device to determine size and width for 1 pair of extra depth shoes and foam casted for 3 pair of insoles.   Documentation of medical necessity will be sent to patient's treating diabetic doctor to verify and sign.   Patient's diabetic provider: Trula Brim MD   Shoes and insoles will be ordered at that time and patient will be notified for an appointment for fitting when they arrive.   Shoe size (per patient): 8.5 Brannock measurement: 8.5 Patient shoe selection- Shoe choice:   585 / [id number] 8.5Md Shoe size ordered: see above  Financials signed

## 2023-08-26 ENCOUNTER — Telehealth: Payer: Self-pay

## 2023-08-26 NOTE — Telephone Encounter (Signed)
Spoke with pt to let him know diabetic shoes are in. Will call back to schedule the appt

## 2023-09-19 ENCOUNTER — Other Ambulatory Visit: Payer: Medicare Other

## 2023-10-07 ENCOUNTER — Encounter: Payer: Self-pay | Admitting: Podiatry

## 2023-10-07 ENCOUNTER — Ambulatory Visit: Payer: Medicare Other | Admitting: Podiatry

## 2023-10-07 VITALS — Ht 67.0 in | Wt 192.0 lb

## 2023-10-07 DIAGNOSIS — M79674 Pain in right toe(s): Secondary | ICD-10-CM | POA: Diagnosis not present

## 2023-10-07 DIAGNOSIS — M2041 Other hammer toe(s) (acquired), right foot: Secondary | ICD-10-CM | POA: Diagnosis not present

## 2023-10-07 DIAGNOSIS — B351 Tinea unguium: Secondary | ICD-10-CM | POA: Diagnosis not present

## 2023-10-07 DIAGNOSIS — M79675 Pain in left toe(s): Secondary | ICD-10-CM

## 2023-10-07 DIAGNOSIS — M2042 Other hammer toe(s) (acquired), left foot: Secondary | ICD-10-CM | POA: Diagnosis not present

## 2023-10-07 DIAGNOSIS — E119 Type 2 diabetes mellitus without complications: Secondary | ICD-10-CM

## 2023-10-07 NOTE — Progress Notes (Signed)
       Subjective:  Patient ID: Richard Frederick, male    DOB: 07/23/1941,  MRN: [id number]  Richard Frederick presents to clinic today for:  Chief Complaint  Patient presents with   [facility]    He is here for a nail trim.    Patient notes nails are thick, discolored, elongated and painful in shoegear when trying to ambulate.  Patient is diabetic shoes are in at this time.  At the end of his appointment today we will fit him for the shoes to ensure adequate to fit the size is appropriate.  PCP is Thana Ates, MD.  Past Medical History:  Diagnosis Date   Anxiety    Hyperlipemia    Hypertension    Type II diabetes mellitus (HCC)    Vision disturbance     Past Surgical History:  Procedure Laterality Date   CATARACT EXTRACTION W/ INTRAOCULAR LENS IMPLANT Right ~ 2013   COLONOSCOPY W/ BIOPSIES AND POLYPECTOMY     COLONOSCOPY WITH PROPOFOL N/A 04/10/2016   Procedure: COLONOSCOPY WITH PROPOFOL;  Surgeon: Charolett Bumpers, MD;  Location: [facility];  Service: Endoscopy;  Laterality: N/A;   TRANSURETHRAL RESECTION OF PROSTATE  early 2000s    Allergies  Allergen Reactions   Amlodipine Other (See Comments)    Edema    Atorvastatin Other (See Comments)    Achy legs   Pravastatin Other (See Comments)    Achy legs   Rosuvastatin Other (See Comments)    Achy legs    Review of Systems: Negative except as noted in the HPI.  Objective:  FOUAD TAUL is a pleasant 83 y.o. male in NAD. AAO x 3.  Vascular Examination: Capillary refill time is 3-5 seconds to toes bilateral. Palpable pedal pulses b/l LE. Digital hair present b/l.  Skin temperature gradient WNL b/l. No varicosities b/l. No cyanosis noted b/l.   Dermatological Examination: Pedal skin with normal turgor, texture and tone b/l. No open wounds. No interdigital macerations b/l. Toenails x10 are 3mm thick, discolored, dystrophic with subungual debris. There is pain with compression of the nail plates.  They are elongated  x10  Assessment/Plan: 1. Pain due to onychomycosis of toenails of both feet   2. Type 2 diabetes mellitus without complication, without long-term current use of insulin (HCC)   3. Hammer toes of both feet    The mycotic toenails were sharply debrided x10 with sterile nail nippers and a power debriding burr to decrease bulk/thickness and length.    Patient is using inserts were fitted today.  They are excellent fit with no tightness or heel slippage when walking.  Patient comfort in the shoes and was counseled on the break-in of his shoes at home over the next 2 weeks.  He reviewed and signed the policies and proof of delivery forms.  He was provided a supplier standard form as well.  Return in about 3 months (around 01/07/2024) for [facility].   Clerance Lav, DPM, FACFAS [facility]     [address] [place], [place] [address]                Office [phone]  Fax [phone]

## 2023-11-07 DIAGNOSIS — K080 Exfoliation of teeth due to systemic causes: Secondary | ICD-10-CM | POA: Diagnosis not present

## 2023-12-09 DIAGNOSIS — N1832 Chronic kidney disease, stage 3b: Secondary | ICD-10-CM | POA: Diagnosis not present

## 2023-12-09 DIAGNOSIS — R630 Anorexia: Secondary | ICD-10-CM | POA: Diagnosis not present

## 2023-12-09 DIAGNOSIS — F03B0 Unspecified dementia, moderate, without behavioral disturbance, psychotic disturbance, mood disturbance, and anxiety: Secondary | ICD-10-CM | POA: Diagnosis not present

## 2023-12-09 DIAGNOSIS — Z7984 Long term (current) use of oral hypoglycemic drugs: Secondary | ICD-10-CM | POA: Diagnosis not present

## 2023-12-09 DIAGNOSIS — R638 Other symptoms and signs concerning food and fluid intake: Secondary | ICD-10-CM | POA: Diagnosis not present

## 2023-12-09 DIAGNOSIS — R5383 Other fatigue: Secondary | ICD-10-CM | POA: Diagnosis not present

## 2023-12-09 DIAGNOSIS — E860 Dehydration: Secondary | ICD-10-CM | POA: Diagnosis not present

## 2023-12-09 DIAGNOSIS — R5381 Other malaise: Secondary | ICD-10-CM | POA: Diagnosis not present

## 2023-12-09 DIAGNOSIS — E1122 Type 2 diabetes mellitus with diabetic chronic kidney disease: Secondary | ICD-10-CM | POA: Diagnosis not present

## 2023-12-09 DIAGNOSIS — E119 Type 2 diabetes mellitus without complications: Secondary | ICD-10-CM | POA: Diagnosis not present

## 2023-12-27 DIAGNOSIS — E1169 Type 2 diabetes mellitus with other specified complication: Secondary | ICD-10-CM | POA: Diagnosis not present

## 2023-12-27 DIAGNOSIS — E1122 Type 2 diabetes mellitus with diabetic chronic kidney disease: Secondary | ICD-10-CM | POA: Diagnosis not present

## 2023-12-27 DIAGNOSIS — E119 Type 2 diabetes mellitus without complications: Secondary | ICD-10-CM | POA: Diagnosis not present

## 2023-12-27 DIAGNOSIS — I10 Essential (primary) hypertension: Secondary | ICD-10-CM | POA: Diagnosis not present

## 2024-01-01 DIAGNOSIS — R051 Acute cough: Secondary | ICD-10-CM | POA: Diagnosis not present

## 2024-01-01 DIAGNOSIS — R262 Difficulty in walking, not elsewhere classified: Secondary | ICD-10-CM | POA: Diagnosis not present

## 2024-01-01 DIAGNOSIS — R399 Unspecified symptoms and signs involving the genitourinary system: Secondary | ICD-10-CM | POA: Diagnosis not present

## 2024-01-02 DIAGNOSIS — R399 Unspecified symptoms and signs involving the genitourinary system: Secondary | ICD-10-CM | POA: Diagnosis not present

## 2024-01-08 ENCOUNTER — Ambulatory Visit (INDEPENDENT_AMBULATORY_CARE_PROVIDER_SITE_OTHER): Admitting: Podiatry

## 2024-01-08 ENCOUNTER — Encounter: Payer: Self-pay | Admitting: Podiatry

## 2024-01-08 DIAGNOSIS — E119 Type 2 diabetes mellitus without complications: Secondary | ICD-10-CM

## 2024-01-08 DIAGNOSIS — M79674 Pain in right toe(s): Secondary | ICD-10-CM | POA: Diagnosis not present

## 2024-01-08 DIAGNOSIS — M79675 Pain in left toe(s): Secondary | ICD-10-CM | POA: Diagnosis not present

## 2024-01-08 DIAGNOSIS — B351 Tinea unguium: Secondary | ICD-10-CM

## 2024-01-08 NOTE — Progress Notes (Signed)
 This patient returns to my office for at risk foot care.  This patient requires this care by a professional since this patient will be at risk due to having diabetes.  This patient is unable to cut nails himself since the patient cannot reach his nails.These nails are painful walking and wearing shoes.  He presents to the office with his wife. This patient presents for at risk foot care today.  General Appearance  Alert, conversant and in no acute stress.  Vascular  Dorsalis pedis and posterior tibial  pulses are palpable  bilaterally.  Capillary return is within normal limits  bilaterally. Temperature is within normal limits  bilaterally.  Neurologic  Senn-Weinstein monofilament wire test within normal limits  bilaterally. Muscle power within normal limits bilaterally.  Nails Thick disfigured discolored nails with subungual debris  from hallux to fifth toes bilaterally. No evidence of bacterial infection or drainage bilaterally.  Orthopedic  No limitations of motion  feet .  No crepitus or effusions noted.  No bony pathology or digital deformities noted.  Skin  normotropic skin with no porokeratosis noted bilaterally.  No signs of infections or ulcers noted.     Onychomycosis  Pain in right toes  Pain in left toes  Consent was obtained for treatment procedures.   Mechanical debridement of nails 1-5  bilaterally performed with a nail nipper.  Filed with dremel without incident.    Return office visit      3 months                Told patient to return for periodic foot care and evaluation due to potential at risk complications.   Ruffin Cotton DPM

## 2024-01-13 ENCOUNTER — Other Ambulatory Visit: Payer: Self-pay

## 2024-01-13 ENCOUNTER — Emergency Department (HOSPITAL_COMMUNITY): Admission: EM | Admit: 2024-01-13 | Discharge: 2024-01-13 | Disposition: A | Discharge status: Home / Self Care

## 2024-01-13 ENCOUNTER — Encounter (HOSPITAL_COMMUNITY): Payer: Self-pay

## 2024-01-13 ENCOUNTER — Emergency Department (HOSPITAL_COMMUNITY)

## 2024-01-13 DIAGNOSIS — R531 Weakness: Secondary | ICD-10-CM | POA: Diagnosis not present

## 2024-01-13 DIAGNOSIS — I4891 Unspecified atrial fibrillation: Secondary | ICD-10-CM | POA: Diagnosis not present

## 2024-01-13 DIAGNOSIS — R630 Anorexia: Secondary | ICD-10-CM | POA: Diagnosis not present

## 2024-01-13 DIAGNOSIS — F0390 Unspecified dementia without behavioral disturbance: Secondary | ICD-10-CM | POA: Diagnosis not present

## 2024-01-13 DIAGNOSIS — R059 Cough, unspecified: Secondary | ICD-10-CM | POA: Insufficient documentation

## 2024-01-13 DIAGNOSIS — Z7901 Long term (current) use of anticoagulants: Secondary | ICD-10-CM | POA: Insufficient documentation

## 2024-01-13 DIAGNOSIS — I959 Hypotension, unspecified: Secondary | ICD-10-CM | POA: Insufficient documentation

## 2024-01-13 DIAGNOSIS — R0602 Shortness of breath: Secondary | ICD-10-CM | POA: Insufficient documentation

## 2024-01-13 HISTORY — DX: Unspecified dementia, unspecified severity, without behavioral disturbance, psychotic disturbance, mood disturbance, and anxiety: F03.90

## 2024-01-13 LAB — CBC WITH DIFFERENTIAL/PLATELET
Abs Immature Granulocytes: 0.11 10*3/uL — ABNORMAL HIGH (ref 0.00–0.07)
Basophils Absolute: 0.1 10*3/uL (ref 0.0–0.1)
Basophils Relative: 1 %
Eosinophils Absolute: 0.4 10*3/uL (ref 0.0–0.5)
Eosinophils Relative: 4 %
HCT: 49.5 % (ref 39.0–52.0)
Hemoglobin: 16 g/dL (ref 13.0–17.0)
Immature Granulocytes: 1 %
Lymphocytes Relative: 26 %
Lymphs Abs: 2.3 10*3/uL (ref 0.7–4.0)
MCH: 28.7 pg (ref 26.0–34.0)
MCHC: 32.3 g/dL (ref 30.0–36.0)
MCV: 88.9 fL (ref 80.0–100.0)
Monocytes Absolute: 0.5 10*3/uL (ref 0.1–1.0)
Monocytes Relative: 6 %
Neutro Abs: 5.3 10*3/uL (ref 1.7–7.7)
Neutrophils Relative %: 62 %
Platelets: 290 10*3/uL (ref 150–400)
RBC: 5.57 MIL/uL (ref 4.22–5.81)
RDW: 13.2 % (ref 11.5–15.5)
WBC: 8.6 10*3/uL (ref 4.0–10.5)
nRBC: 0 % (ref 0.0–0.2)

## 2024-01-13 LAB — URINALYSIS, W/ REFLEX TO CULTURE (INFECTION SUSPECTED)
Bacteria, UA: NONE SEEN
Bilirubin Urine: NEGATIVE
Glucose, UA: NEGATIVE mg/dL
Hgb urine dipstick: NEGATIVE
Ketones, ur: NEGATIVE mg/dL
Nitrite: NEGATIVE
Protein, ur: NEGATIVE mg/dL
Specific Gravity, Urine: 1.02 (ref 1.005–1.030)
pH: 5 (ref 5.0–8.0)

## 2024-01-13 LAB — BASIC METABOLIC PANEL WITH GFR
Anion gap: 11 (ref 5–15)
BUN: 36 mg/dL — ABNORMAL HIGH (ref 8–23)
CO2: 23 mmol/L (ref 22–32)
Calcium: 10.4 mg/dL — ABNORMAL HIGH (ref 8.9–10.3)
Chloride: 101 mmol/L (ref 98–111)
Creatinine, Ser: 1.51 mg/dL — ABNORMAL HIGH (ref 0.61–1.24)
GFR, Estimated: 45 mL/min — ABNORMAL LOW (ref >60)
Glucose, Bld: 114 mg/dL — ABNORMAL HIGH (ref 70–99)
Potassium: 4.8 mmol/L (ref 3.5–5.1)
Sodium: 135 mmol/L (ref 135–145)

## 2024-01-13 LAB — TROPONIN I (HIGH SENSITIVITY)
Troponin I (High Sensitivity): 6 ng/L (ref <18)
Troponin I (High Sensitivity): 5 ng/L (ref <18)

## 2024-01-13 LAB — RESP PANEL BY RT-PCR (RSV, FLU A&B, COVID)  RVPGX2
Influenza A by PCR: NEGATIVE
Influenza B by PCR: NEGATIVE
Resp Syncytial Virus by PCR: NEGATIVE
SARS Coronavirus 2 by RT PCR: NEGATIVE

## 2024-01-13 LAB — I-STAT CG4 LACTIC ACID, ED: Lactic Acid, Venous: 1.3 mmol/L (ref 0.5–1.9)

## 2024-01-13 LAB — BRAIN NATRIURETIC PEPTIDE: B Natriuretic Peptide: 32.2 pg/mL (ref 0.0–100.0)

## 2024-01-13 MED ORDER — SODIUM CHLORIDE 0.9 % IV BOLUS: 500.0000 mL | Freq: Once | INTRAVENOUS | Status: AC

## 2024-01-13 NOTE — ED Triage Notes (Signed)
 Patient has had low blood pressure all weekend of 80/50. Had a UTI in May and June and his doctor wanted him sent out. Not eating or drinking. No appetite. Denies abdominal pain. Feels dizzy. No chest pain.

## 2024-01-13 NOTE — ED Provider Notes (Signed)
 [place] EMERGENCY DEPARTMENT AT [facility] Provider Note   CSN: [id number] Arrival date & time: 01/13/24  1010     Patient presents with: Hypotension   Richard Frederick is a 83 y.o. male.   83 year old male presents for evaluation of hypotension and generalized weakness.  Patient has a history of dementia and wife provides majority of history.  She states that patient has had 2 UTIs in the last 2 months and been treated for Voet both.  States he finished his antibiotics last Wednesday.  She states since then he has been progressively weak, had trouble walking and not been eating or drinking.  She states she talked to their primary care doctor and was advised yesterday to bring him to the ER.  She states his blood pressure has been low at home between the 80s and 90s systolic.  She states he has had an increase productive cough and she has noticed some rattling while he is breathing.  She admits to some nausea couple days ago but no episodes of vomiting or diarrhea.  Patient has no history of cardiac problems, no history of A-fib and no history of lung problems.        Prior to Admission medications   Medication Sig Start Date End Date Taking? Authorizing Provider  clopidogrel  (PLAVIX ) 75 MG tablet Take 1 tablet (75 mg total) by mouth daily. Please request future refills from PCP. 06/29/19   Phebe Brasil, MD  Cyanocobalamin (VITAMIN B 12 PO) Take by mouth. 1000 mg daily    [provider]  donepezil (ARICEPT) 10 MG tablet Take 1 tablet by mouth daily. 04/06/18   [provider]  ezetimibe (ZETIA) 10 MG tablet Take 10 mg by mouth daily. 06/18/18   [provider]  lisinopril -hydrochlorothiazide  (PRINZIDE ,ZESTORETIC ) 20-12.5 MG tablet Take 1 tablet by mouth daily. 04/29/18   [provider]  memantine (NAMENDA) 10 MG tablet Take 10 mg by mouth 2 (two) times daily. 06/25/18   [provider]  metFORMIN (GLUCOPHAGE) 500 MG tablet Take  by mouth daily with breakfast.    [provider]  Niacin (VITAMIN B-3 PO) Take 1 tablet by mouth daily.    [provider]  sertraline (ZOLOFT) 50 MG tablet Take 50 mg by mouth daily. 06/25/18   [provider]  tamsulosin (FLOMAX) 0.4 MG CAPS capsule Take 0.4 mg by mouth daily. 04/29/18   [provider]  triamcinolone (KENALOG) 0.1 % paste Use as directed 1 application in the mouth or throat at bedtime. 03/26/18   [provider]    Allergies: Amlodipine, Atorvastatin, Pravastatin, and Rosuvastatin    Review of Systems  Constitutional:  Positive for fatigue. Negative for chills and fever.  HENT:  Negative for ear pain and sore throat.   Eyes:  Negative for pain and visual disturbance.  Respiratory:  Positive for cough and shortness of breath.   Cardiovascular:  Negative for chest pain and palpitations.  Gastrointestinal:  Positive for nausea. Negative for abdominal pain and vomiting.  Genitourinary:  Negative for dysuria and hematuria.  Musculoskeletal:  Negative for arthralgias and back pain.  Skin:  Negative for color change and rash.  Neurological:  Positive for weakness. Negative for seizures and syncope.  All other systems reviewed and are negative.   Updated Vital Signs BP 122/85   Pulse 88   Temp 98.4 F (36.9 C) (Oral)   Resp 18   Ht 5' 7 (1.702 m)   Wt 81.6 kg  SpO2 95%   BMI 28.19 kg/m   Physical Exam Vitals and nursing note reviewed.  Constitutional:      General: He is not in acute distress.    Appearance: Normal appearance. He is well-developed. He is obese. He is not ill-appearing.  HENT:     Head: Normocephalic and atraumatic.   Eyes:     Conjunctiva/sclera: Conjunctivae normal.    Cardiovascular:     Rate and Rhythm: Normal rate. Rhythm irregular.     Heart sounds: Normal heart sounds. No murmur heard. Pulmonary:     Effort: Pulmonary effort is normal. No respiratory distress.     Breath sounds:  Rhonchi present.  Abdominal:     General: There is no distension.     Palpations: Abdomen is soft. There is no mass.     Tenderness: There is no abdominal tenderness.     Hernia: No hernia is present.   Musculoskeletal:        General: No swelling.     Cervical back: Neck supple.     Right lower leg: No edema.     Left lower leg: No edema.   Skin:    General: Skin is warm and dry.     Capillary Refill: Capillary refill takes less than 2 seconds.   Neurological:     General: No focal deficit present.     Mental Status: He is alert.   Psychiatric:        Mood and Affect: Mood normal.     (all labs ordered are listed, but only abnormal results are displayed) Labs Reviewed  BASIC METABOLIC PANEL WITH GFR - Abnormal; Notable for the following components:      Result Value   Glucose, Bld 114 (*)    BUN 36 (*)    Creatinine, Ser 1.51 (*)    Calcium 10.4 (*)    GFR, Estimated 45 (*)    All other components within normal limits  CBC WITH DIFFERENTIAL/PLATELET - Abnormal; Notable for the following components:   Abs Immature Granulocytes 0.11 (*)    All other components within normal limits  URINALYSIS, W/ REFLEX TO CULTURE (INFECTION SUSPECTED) - Abnormal; Notable for the following components:   Leukocytes,Ua MODERATE (*)    All other components within normal limits  RESP PANEL BY RT-PCR (RSV, FLU A&B, COVID)  [id number]  URINE CULTURE  CULTURE, BLOOD (ROUTINE X 2)  CULTURE, BLOOD (ROUTINE X 2)  BRAIN NATRIURETIC PEPTIDE  I-STAT CG4 LACTIC ACID, ED  TROPONIN I (HIGH SENSITIVITY)  TROPONIN I (HIGH SENSITIVITY)    EKG: EKG Interpretation Date/Time:  Monday January 13 2024 10:20:05 EDT Ventricular Rate:  84 PR Interval:    QRS Duration:  79 QT Interval:  324 QTC Calculation: 383 R Axis:   -30  Text Interpretation: Atrial fibrillation Abnormal R-wave progression, early transition Inferior infarct, old No STEMI, afib with new when compared with prior EKG from 02/22/2016  Confirmed by Lorriane Rote ([id number]) on 01/13/2024 10:53:12 AM  Radiology: Lenell Query Chest 2 View Result Date: 01/13/2024 CLINICAL DATA:  Shortness of breath EXAM: CHEST - 2 VIEW COMPARISON:  None Available. FINDINGS: The heart size and mediastinal contours are within normal limits. Both lungs are clear. The visualized skeletal structures are unremarkable. IMPRESSION: No active cardiopulmonary disease. Electronically Signed   By: Fredrich Jefferson M.D.   On: 01/13/2024 11:41     Procedures   Medications Ordered in the ED  sodium chloride  0.9 % bolus 500 mL (0 mLs Intravenous Stopped 01/13/24  1201)    Clinical Course as of 01/13/24 1638  Mon Jan 13, 2024  1225 Recheck: Patient reevaluated.  Orthostatics unable Blood pressure has improved.  He is feeling well and asking to go home.  Oxygen is now 96% on room air.  I discussed results so far with patient and family at bedside [MK]    Clinical Course User Index [MK] Kelle Pate, DO                                 Medical Decision Making Medical Decision Making Nursing notes are reviewed. Differential diagnosis for this patient would include but not limited to: Arrhythmia, pneumonia, COVID, RSV, dehydration, orthostasis, other  Records reviewed: Podiatry office visit from 01/08/2024 reviewed and show patient is being treated for toenail fungus  Studies: Independent interpretation of 2 view chest xray: shows no infiltrates, no cardiomegaly, no acute disease process  EKG interpretation: Interpreted by me in the absence of cardiology and shows atrial fibrillation, normal rate, no STEMI, afib new when compared with prior EKG  Cardiac monitor interpretation: showed atrial fibrillation, no other abnormality, no PVCs  Emergency Department Course:  Vital signs and pulse oximetry are reviewed, evaluated by myself and found to be within normal limits prior to final disposition. Findings of laboratory testing and medical imaging are discussed with  patient and family that is available. Patient agrees with the medical care plan as follows:  Patient's lab workup reviewed by me and fairly unremarkable.  Orthostatics are negative.  Was given 500 cc of IV fluid he is feeling better would like to go home.  He has no evidence of pneumonia or any acute etiology to cause his transient hypoxia other than possibly the A-fib and some dehydration.  A-fib is rate controlled.  Patient offered admission but declined and would like to go home and his wife would like to have him go home and with they will plan to follow-up with his primary care doctor.  Will plan to call the office today or tomorrow to arrange close follow-up appointment for patient's new onset A-fib.  Discussed with him possible blood thinners and they will hold off on this time and discuss further.  Advised return to the ER for any worsening symptoms.  Patient and family at bedside feel comfortable to plan be discharged home.  Problems Addressed: Atrial fibrillation, unspecified type [facility]): undiagnosed new problem with uncertain prognosis Decreased appetite: undiagnosed new problem with uncertain prognosis Hypotension, unspecified hypotension type: acute illness or injury  Amount and/or Complexity of Data Reviewed Independent Historian: spouse External Data Reviewed: notes. Labs: ordered. Decision-making details documented in ED Course. Radiology: ordered and independent interpretation performed. Decision-making details documented in ED Course. ECG/medicine tests: ordered and independent interpretation performed. Decision-making details documented in ED Course.  Risk OTC drugs. Drug therapy requiring intensive monitoring for toxicity.     Final diagnoses:  Decreased appetite  Atrial fibrillation, unspecified type (HCC)  Hypotension, unspecified hypotension type    ED Discharge Orders     None          Kelle Pate, DO 01/13/24 1638

## 2024-01-13 NOTE — Discharge Instructions (Signed)
 Please call and follow-up with your primary care doctor later this week.  Venofundin have a family here in the ER but your rate was normal.  Please return to the ER if your symptoms worsen or if your heart rate increases above 100.  Statin your other medications as prescribed and try to eat and drink as much as possible even if your appetite is decreased.

## 2024-01-14 LAB — URINE CULTURE: Culture: NO GROWTH

## 2024-01-18 LAB — CULTURE, BLOOD (ROUTINE X 2)
Culture: NO GROWTH
Special Requests: ADEQUATE

## 2024-01-21 DIAGNOSIS — R0982 Postnasal drip: Secondary | ICD-10-CM | POA: Diagnosis not present

## 2024-01-21 DIAGNOSIS — F03B0 Unspecified dementia, moderate, without behavioral disturbance, psychotic disturbance, mood disturbance, and anxiety: Secondary | ICD-10-CM | POA: Diagnosis not present

## 2024-01-21 DIAGNOSIS — I4891 Unspecified atrial fibrillation: Secondary | ICD-10-CM | POA: Diagnosis not present

## 2024-01-21 DIAGNOSIS — I10 Essential (primary) hypertension: Secondary | ICD-10-CM | POA: Diagnosis not present

## 2024-01-25 DIAGNOSIS — E1122 Type 2 diabetes mellitus with diabetic chronic kidney disease: Secondary | ICD-10-CM | POA: Diagnosis not present

## 2024-01-25 DIAGNOSIS — I10 Essential (primary) hypertension: Secondary | ICD-10-CM | POA: Diagnosis not present

## 2024-01-25 DIAGNOSIS — E119 Type 2 diabetes mellitus without complications: Secondary | ICD-10-CM | POA: Diagnosis not present

## 2024-01-25 DIAGNOSIS — E1169 Type 2 diabetes mellitus with other specified complication: Secondary | ICD-10-CM | POA: Diagnosis not present

## 2024-01-27 DIAGNOSIS — E1122 Type 2 diabetes mellitus with diabetic chronic kidney disease: Secondary | ICD-10-CM | POA: Diagnosis not present

## 2024-01-27 DIAGNOSIS — N400 Enlarged prostate without lower urinary tract symptoms: Secondary | ICD-10-CM | POA: Diagnosis not present

## 2024-01-27 DIAGNOSIS — I10 Essential (primary) hypertension: Secondary | ICD-10-CM | POA: Diagnosis not present

## 2024-01-27 DIAGNOSIS — E119 Type 2 diabetes mellitus without complications: Secondary | ICD-10-CM | POA: Diagnosis not present

## 2024-01-27 DIAGNOSIS — E1169 Type 2 diabetes mellitus with other specified complication: Secondary | ICD-10-CM | POA: Diagnosis not present

## 2024-01-27 DIAGNOSIS — F0390 Unspecified dementia without behavioral disturbance: Secondary | ICD-10-CM | POA: Diagnosis not present

## 2024-01-27 DIAGNOSIS — F411 Generalized anxiety disorder: Secondary | ICD-10-CM | POA: Diagnosis not present

## 2024-02-07 DIAGNOSIS — I4891 Unspecified atrial fibrillation: Secondary | ICD-10-CM | POA: Diagnosis not present

## 2024-02-10 DIAGNOSIS — I4891 Unspecified atrial fibrillation: Secondary | ICD-10-CM | POA: Diagnosis not present

## 2024-02-14 ENCOUNTER — Encounter: Payer: Self-pay | Admitting: Advanced Practice Midwife

## 2024-02-24 DIAGNOSIS — E1169 Type 2 diabetes mellitus with other specified complication: Secondary | ICD-10-CM | POA: Diagnosis not present

## 2024-02-24 DIAGNOSIS — I10 Essential (primary) hypertension: Secondary | ICD-10-CM | POA: Diagnosis not present

## 2024-02-24 DIAGNOSIS — E1122 Type 2 diabetes mellitus with diabetic chronic kidney disease: Secondary | ICD-10-CM | POA: Diagnosis not present

## 2024-02-24 DIAGNOSIS — E119 Type 2 diabetes mellitus without complications: Secondary | ICD-10-CM | POA: Diagnosis not present

## 2024-02-27 DIAGNOSIS — E119 Type 2 diabetes mellitus without complications: Secondary | ICD-10-CM | POA: Diagnosis not present

## 2024-02-27 DIAGNOSIS — N400 Enlarged prostate without lower urinary tract symptoms: Secondary | ICD-10-CM | POA: Diagnosis not present

## 2024-02-27 DIAGNOSIS — E1169 Type 2 diabetes mellitus with other specified complication: Secondary | ICD-10-CM | POA: Diagnosis not present

## 2024-02-27 DIAGNOSIS — F0390 Unspecified dementia without behavioral disturbance: Secondary | ICD-10-CM | POA: Diagnosis not present

## 2024-02-27 DIAGNOSIS — E1122 Type 2 diabetes mellitus with diabetic chronic kidney disease: Secondary | ICD-10-CM | POA: Diagnosis not present

## 2024-02-27 DIAGNOSIS — F411 Generalized anxiety disorder: Secondary | ICD-10-CM | POA: Diagnosis not present

## 2024-02-27 DIAGNOSIS — I10 Essential (primary) hypertension: Secondary | ICD-10-CM | POA: Diagnosis not present

## 2024-03-04 DIAGNOSIS — K080 Exfoliation of teeth due to systemic causes: Secondary | ICD-10-CM | POA: Diagnosis not present

## 2024-03-09 NOTE — Progress Notes (Signed)
 Electrophysiology Office Note:   Date:  03/10/2024  ID:  Richard Frederick, Richard Frederick May 27, 1941, MRN [id number]  Primary Cardiologist: None Electrophysiologist: Fonda Kitty, MD      History of Present Illness:   Richard Frederick is a 83 y.o. male with h/o DMII, HTN, mild dementia, BPH, persistent atrial fibrillation who is being seen today for AF management at the request of Dr. Dwight.  Discussed the use of AI scribe software for clinical note transcription with the patient, who gave verbal consent to proceed.  History of Present Illness Richard Frederick is an 83 year old male with atrial fibrillation who presents for evaluation of his heart rhythm and management options. He is accompanied by his wife, who makes his medical decisions. He was referred by Dr. Dwight for further evaluation of atrial fibrillation.  He was initially taken to [facility] emergency room due to confusion, inability to walk, and low blood pressure. He was diagnosed with dehydration which occurred in setting of decreased intake. He had also had recent UTIs. During ED visit, was found to have atrial fibrillation and received fluids before being discharged home.  He has a history of dementia and has been experiencing progressively worsening clinical status and walking difficulties, requiring assistance and a transport chair. He has fallen four times recently, which occurred after his hospital visit.  His blood pressure was noted to be low while on lisinopril  and hydrochlorothiazide , leading to the discontinuation of these medications. His blood pressure has remained stable since stopping these medications.  He is currently taking metoprolol for atrial fibrillation. He is not on any blood thinners.  He feels 'all right' and has not noticed any significant changes in his condition over the past month, although he experiences increased fatigue and prefers to stay in bed. This does not appear to bother him.     Review of systems  complete and found to be negative unless listed in HPI.   EP Information / Studies Reviewed:    EKG is ordered today. Personal review as below.  EKG Interpretation Date/Time:  Tuesday March 10 2024 16:06:52 EDT Ventricular Rate:  81 PR Interval:    QRS Duration:  66 QT Interval:  368 QTC Calculation: 427 R Axis:   -23  Text Interpretation: Atrial fibrillation Inferior infarct , age undetermined When compared with ECG of 13-Jan-2024 10:20,similar findings. Confirmed by Kitty Fonda [phone]) on 03/10/2024 8:56:54 PM   EKG 01/13/24:   Margarete Zio 12/2022:   Echo 06/2018: Study Conclusions   - Left ventricle: The cavity size was normal. Wall thickness was    increased in a pattern of mild LVH. Systolic function was normal.    The estimated ejection fraction was in the range of 60% to 65%.    Wall motion was normal; there were no regional wall motion    abnormalities. Doppler parameters are consistent with abnormal    left ventricular relaxation (grade 1 diastolic dysfunction).   Risk Assessment/Calculations:    CHA2DS2-VASc Score = 4   This indicates a 4.8% annual risk of stroke. The patient's score is based upon: CHF History: 0 HTN History: 1 Diabetes History: 1 Stroke History: 0 Vascular Disease History: 0 Age Score: 2 Gender Score: 0      Physical Exam:   VS:  BP (!) 147/87   Pulse 76   Ht 5' 7 (1.702 m)   Wt 181 lb (82.1 kg)   SpO2 96%   BMI 28.35 kg/m    Wt Readings from  Last 3 Encounters:  03/10/24 181 lb (82.1 kg)  01/13/24 180 lb (81.6 kg)  10/07/23 192 lb (87.1 kg)     GEN: Well nourished, well developed in no acute distress NECK: No JVD CARDIAC: Normal rate, irregular rhythm RESPIRATORY:  Clear to auscultation without rales, wheezing or rhonchi  ABDOMEN: Soft, non-distended EXTREMITIES:  No edema; No deformity   ASSESSMENT AND PLAN:    #Persistent atrial fibrillation: Patient appears to have no awareness of his arrhythmia.  No significant  change in symptoms or quality of life since developing atrial fibrillation.  He appears to be rate controlled on metoprolol. #Hypercoagulable state due to atrial fibrillation: We had a very extensive discussion regarding risks and benefits of oral anticoagulation for stroke prophylaxis in the setting of atrial fibrillation.  Patient has significant dementia associated with clinical decline.  He has had recurrent falls.  His wife, who makes his medical decisions, is very worried about risk of bleeding on anticoagulation in the setting of his frequent falls.  He has a chadsvasc of 4 which correlates to a annual stroke risk of approximately 4.8%.  At this point, it would seem that his risk of adverse outcomes on anticoagulation would exceed potential benefit.  Patient and wife are against oral anticoagulation for stroke prophylaxis at this time.  I did discuss with wife about the possibility of a Watchman implant, which would offer some protection against stroke but mitigate need for long-term anticoagulation.  This would, however, require a procedure, which also carries its own risks, as well as short-term oral anticoagulation.  Patient and family are leaning towards no procedural options at this time given his baseline dementia. -Continue metoprolol for rate control.   #Hypertension -Above goal today.  Recommend checking blood pressures 1-2 times per week at home and recording the values.  Recommend bringing these recordings to the primary care physician.  Follow up with EP Team as needed.   Signed, Fonda Kitty, MD

## 2024-03-10 ENCOUNTER — Encounter: Payer: Self-pay | Admitting: Cardiology

## 2024-03-10 ENCOUNTER — Ambulatory Visit: Attending: Cardiology | Admitting: Cardiology

## 2024-03-10 VITALS — BP 147/87 | HR 76 | Ht 67.0 in | Wt 181.0 lb

## 2024-03-10 DIAGNOSIS — I4819 Other persistent atrial fibrillation: Secondary | ICD-10-CM | POA: Diagnosis not present

## 2024-03-10 DIAGNOSIS — D6869 Other thrombophilia: Secondary | ICD-10-CM

## 2024-03-10 DIAGNOSIS — I10 Essential (primary) hypertension: Secondary | ICD-10-CM

## 2024-03-10 NOTE — Patient Instructions (Signed)

## 2024-03-25 DIAGNOSIS — I10 Essential (primary) hypertension: Secondary | ICD-10-CM | POA: Diagnosis not present

## 2024-03-25 DIAGNOSIS — E1169 Type 2 diabetes mellitus with other specified complication: Secondary | ICD-10-CM | POA: Diagnosis not present

## 2024-03-25 DIAGNOSIS — E1122 Type 2 diabetes mellitus with diabetic chronic kidney disease: Secondary | ICD-10-CM | POA: Diagnosis not present

## 2024-03-25 DIAGNOSIS — E119 Type 2 diabetes mellitus without complications: Secondary | ICD-10-CM | POA: Diagnosis not present

## 2024-03-26 DIAGNOSIS — I4819 Other persistent atrial fibrillation: Secondary | ICD-10-CM | POA: Diagnosis not present

## 2024-03-26 DIAGNOSIS — I10 Essential (primary) hypertension: Secondary | ICD-10-CM | POA: Diagnosis not present

## 2024-03-26 DIAGNOSIS — R29898 Other symptoms and signs involving the musculoskeletal system: Secondary | ICD-10-CM | POA: Diagnosis not present

## 2024-03-29 DIAGNOSIS — E1169 Type 2 diabetes mellitus with other specified complication: Secondary | ICD-10-CM | POA: Diagnosis not present

## 2024-03-29 DIAGNOSIS — N400 Enlarged prostate without lower urinary tract symptoms: Secondary | ICD-10-CM | POA: Diagnosis not present

## 2024-03-29 DIAGNOSIS — I10 Essential (primary) hypertension: Secondary | ICD-10-CM | POA: Diagnosis not present

## 2024-03-29 DIAGNOSIS — F411 Generalized anxiety disorder: Secondary | ICD-10-CM | POA: Diagnosis not present

## 2024-03-29 DIAGNOSIS — E1122 Type 2 diabetes mellitus with diabetic chronic kidney disease: Secondary | ICD-10-CM | POA: Diagnosis not present

## 2024-03-29 DIAGNOSIS — E119 Type 2 diabetes mellitus without complications: Secondary | ICD-10-CM | POA: Diagnosis not present

## 2024-03-29 DIAGNOSIS — F0390 Unspecified dementia without behavioral disturbance: Secondary | ICD-10-CM | POA: Diagnosis not present

## 2024-04-08 ENCOUNTER — Inpatient Hospital Stay (HOSPITAL_BASED_OUTPATIENT_CLINIC_OR_DEPARTMENT_OTHER)
Admission: EM | Admit: 2024-04-08 | Discharge: 2024-04-11 | DRG: 071 | Disposition: A | Discharge status: Home Health | Attending: Internal Medicine | Admitting: Internal Medicine

## 2024-04-08 ENCOUNTER — Other Ambulatory Visit: Payer: Self-pay

## 2024-04-08 ENCOUNTER — Observation Stay (HOSPITAL_COMMUNITY)

## 2024-04-08 ENCOUNTER — Emergency Department (HOSPITAL_BASED_OUTPATIENT_CLINIC_OR_DEPARTMENT_OTHER)

## 2024-04-08 ENCOUNTER — Encounter (HOSPITAL_BASED_OUTPATIENT_CLINIC_OR_DEPARTMENT_OTHER): Payer: Self-pay

## 2024-04-08 DIAGNOSIS — Z7902 Long term (current) use of antithrombotics/antiplatelets: Secondary | ICD-10-CM | POA: Diagnosis not present

## 2024-04-08 DIAGNOSIS — G9340 Encephalopathy, unspecified: Secondary | ICD-10-CM | POA: Diagnosis not present

## 2024-04-08 DIAGNOSIS — Z803 Family history of malignant neoplasm of breast: Secondary | ICD-10-CM | POA: Diagnosis not present

## 2024-04-08 DIAGNOSIS — F03918 Unspecified dementia, unspecified severity, with other behavioral disturbance: Secondary | ICD-10-CM | POA: Diagnosis not present

## 2024-04-08 DIAGNOSIS — M4313 Spondylolisthesis, cervicothoracic region: Secondary | ICD-10-CM | POA: Diagnosis not present

## 2024-04-08 DIAGNOSIS — Z823 Family history of stroke: Secondary | ICD-10-CM

## 2024-04-08 DIAGNOSIS — Z7984 Long term (current) use of oral hypoglycemic drugs: Secondary | ICD-10-CM

## 2024-04-08 DIAGNOSIS — E119 Type 2 diabetes mellitus without complications: Secondary | ICD-10-CM

## 2024-04-08 DIAGNOSIS — Z79899 Other long term (current) drug therapy: Secondary | ICD-10-CM

## 2024-04-08 DIAGNOSIS — Z888 Allergy status to other drugs, medicaments and biological substances status: Secondary | ICD-10-CM

## 2024-04-08 DIAGNOSIS — E785 Hyperlipidemia, unspecified: Secondary | ICD-10-CM | POA: Diagnosis present

## 2024-04-08 DIAGNOSIS — N1832 Chronic kidney disease, stage 3b: Secondary | ICD-10-CM | POA: Diagnosis present

## 2024-04-08 DIAGNOSIS — I480 Paroxysmal atrial fibrillation: Secondary | ICD-10-CM | POA: Diagnosis present

## 2024-04-08 DIAGNOSIS — I129 Hypertensive chronic kidney disease with stage 1 through stage 4 chronic kidney disease, or unspecified chronic kidney disease: Secondary | ICD-10-CM | POA: Diagnosis present

## 2024-04-08 DIAGNOSIS — G459 Transient cerebral ischemic attack, unspecified: Secondary | ICD-10-CM | POA: Diagnosis not present

## 2024-04-08 DIAGNOSIS — M47812 Spondylosis without myelopathy or radiculopathy, cervical region: Secondary | ICD-10-CM | POA: Diagnosis not present

## 2024-04-08 DIAGNOSIS — G9341 Metabolic encephalopathy: Principal | ICD-10-CM | POA: Diagnosis present

## 2024-04-08 DIAGNOSIS — N400 Enlarged prostate without lower urinary tract symptoms: Secondary | ICD-10-CM | POA: Diagnosis present

## 2024-04-08 DIAGNOSIS — Z87891 Personal history of nicotine dependence: Secondary | ICD-10-CM

## 2024-04-08 DIAGNOSIS — R918 Other nonspecific abnormal finding of lung field: Secondary | ICD-10-CM | POA: Diagnosis not present

## 2024-04-08 DIAGNOSIS — R0989 Other specified symptoms and signs involving the circulatory and respiratory systems: Secondary | ICD-10-CM | POA: Diagnosis not present

## 2024-04-08 DIAGNOSIS — R296 Repeated falls: Secondary | ICD-10-CM | POA: Diagnosis present

## 2024-04-08 DIAGNOSIS — Z8249 Family history of ischemic heart disease and other diseases of the circulatory system: Secondary | ICD-10-CM

## 2024-04-08 DIAGNOSIS — I4891 Unspecified atrial fibrillation: Secondary | ICD-10-CM | POA: Diagnosis not present

## 2024-04-08 DIAGNOSIS — I10 Essential (primary) hypertension: Secondary | ICD-10-CM | POA: Diagnosis not present

## 2024-04-08 DIAGNOSIS — F03C18 Unspecified dementia, severe, with other behavioral disturbance: Secondary | ICD-10-CM | POA: Diagnosis present

## 2024-04-08 DIAGNOSIS — I6782 Cerebral ischemia: Secondary | ICD-10-CM | POA: Diagnosis not present

## 2024-04-08 DIAGNOSIS — Z66 Do not resuscitate: Secondary | ICD-10-CM | POA: Diagnosis present

## 2024-04-08 DIAGNOSIS — S199XXA Unspecified injury of neck, initial encounter: Secondary | ICD-10-CM | POA: Diagnosis not present

## 2024-04-08 DIAGNOSIS — J9811 Atelectasis: Secondary | ICD-10-CM | POA: Diagnosis not present

## 2024-04-08 DIAGNOSIS — E1122 Type 2 diabetes mellitus with diabetic chronic kidney disease: Secondary | ICD-10-CM | POA: Diagnosis present

## 2024-04-08 DIAGNOSIS — R4182 Altered mental status, unspecified: Secondary | ICD-10-CM | POA: Diagnosis not present

## 2024-04-08 HISTORY — DX: Unspecified atrial fibrillation: I48.91

## 2024-04-08 LAB — URINALYSIS, COMPLETE (UACMP) WITH MICROSCOPIC
Bacteria, UA: NONE SEEN
Bilirubin Urine: NEGATIVE
Glucose, UA: NEGATIVE mg/dL
Hgb urine dipstick: NEGATIVE
Ketones, ur: NEGATIVE mg/dL
Leukocytes,Ua: NEGATIVE
Nitrite: NEGATIVE
Protein, ur: NEGATIVE mg/dL
Specific Gravity, Urine: 1.011 (ref 1.005–1.030)
pH: 5 (ref 5.0–8.0)

## 2024-04-08 LAB — COMPREHENSIVE METABOLIC PANEL WITH GFR
ALT: 23 U/L (ref 0–44)
AST: 20 U/L (ref 15–41)
Albumin: 4.4 g/dL (ref 3.5–5.0)
Alkaline Phosphatase: 71 U/L (ref 38–126)
Anion gap: 13 (ref 5–15)
BUN: 20 mg/dL (ref 8–23)
CO2: 25 mmol/L (ref 22–32)
Calcium: 10.2 mg/dL (ref 8.9–10.3)
Chloride: 100 mmol/L (ref 98–111)
Creatinine, Ser: 1.43 mg/dL — ABNORMAL HIGH (ref 0.61–1.24)
GFR, Estimated: 48 mL/min — ABNORMAL LOW (ref >60)
Glucose, Bld: 115 mg/dL — ABNORMAL HIGH (ref 70–99)
Potassium: 4.2 mmol/L (ref 3.5–5.1)
Sodium: 138 mmol/L (ref 135–145)
Total Bilirubin: 0.2 mg/dL (ref 0.0–1.2)
Total Protein: 7.2 g/dL (ref 6.5–8.1)

## 2024-04-08 LAB — PROTIME-INR
INR: 1 (ref 0.8–1.2)
Prothrombin Time: 13.6 s (ref 11.4–15.2)

## 2024-04-08 LAB — CBC
HCT: 48.1 % (ref 39.0–52.0)
Hemoglobin: 15.6 g/dL (ref 13.0–17.0)
MCH: 28.6 pg (ref 26.0–34.0)
MCHC: 32.4 g/dL (ref 30.0–36.0)
MCV: 88.1 fL (ref 80.0–100.0)
Platelets: 229 K/uL (ref 150–400)
RBC: 5.46 MIL/uL (ref 4.22–5.81)
RDW: 13.4 % (ref 11.5–15.5)
WBC: 10.2 K/uL (ref 4.0–10.5)
nRBC: 0 % (ref 0.0–0.2)

## 2024-04-08 LAB — DIFFERENTIAL
Abs Immature Granulocytes: 0.03 K/uL (ref 0.00–0.07)
Basophils Absolute: 0.1 K/uL (ref 0.0–0.1)
Basophils Relative: 1 %
Eosinophils Absolute: 0.3 K/uL (ref 0.0–0.5)
Eosinophils Relative: 3 %
Immature Granulocytes: 0 %
Lymphocytes Relative: 21 %
Lymphs Abs: 2.1 K/uL (ref 0.7–4.0)
Monocytes Absolute: 0.7 K/uL (ref 0.1–1.0)
Monocytes Relative: 7 %
Neutro Abs: 7 K/uL (ref 1.7–7.7)
Neutrophils Relative %: 68 %

## 2024-04-08 LAB — PHOSPHORUS: Phosphorus: 3.5 mg/dL (ref 2.5–4.6)

## 2024-04-08 LAB — CK: Total CK: 70 U/L (ref 49–397)

## 2024-04-08 LAB — GLUCOSE, CAPILLARY
Glucose-Capillary: 83 mg/dL (ref 70–99)
Glucose-Capillary: 86 mg/dL (ref 70–99)

## 2024-04-08 LAB — ETHANOL: Alcohol, Ethyl (B): <15 mg/dL (ref <15)

## 2024-04-08 LAB — HEMOGLOBIN A1C
Hgb A1c MFr Bld: 5.4 % (ref 4.8–5.6)
Mean Plasma Glucose: 108.28 mg/dL

## 2024-04-08 LAB — APTT: aPTT: 31 s (ref 24–36)

## 2024-04-08 LAB — TROPONIN I (HIGH SENSITIVITY): Troponin I (High Sensitivity): 9 ng/L (ref <18)

## 2024-04-08 LAB — CBG MONITORING, ED: Glucose-Capillary: 145 mg/dL — ABNORMAL HIGH (ref 70–99)

## 2024-04-08 LAB — MAGNESIUM: Magnesium: 2 mg/dL (ref 1.7–2.4)

## 2024-04-08 MED ORDER — TAMSULOSIN HCL 0.4 MG PO CAPS
0.4000 mg | ORAL_CAPSULE | Freq: Every day | ORAL | Status: DC
Start: 2024-04-09 — End: 2024-04-11
  Administered 2024-04-09 – 2024-04-11 (×3): 0.4 mg via ORAL
  Filled 2024-04-08 (×3): qty 1

## 2024-04-08 MED ORDER — METOPROLOL SUCCINATE ER 25 MG PO TB24: 25.0000 mg | ORAL_TABLET | Freq: Every day | ORAL | Status: DC

## 2024-04-08 MED ORDER — ASPIRIN 81 MG PO TBEC: 81.0000 mg | DELAYED_RELEASE_TABLET | Freq: Every day | ORAL | Status: DC

## 2024-04-08 MED ORDER — INSULIN ASPART 100 UNIT/ML IJ SOLN
0.0000 [IU] | INTRAMUSCULAR | Status: DC
  Administered 2024-04-09: 1 [IU] via SUBCUTANEOUS

## 2024-04-08 MED ORDER — SERTRALINE HCL 50 MG PO TABS
50.0000 mg | ORAL_TABLET | Freq: Every day | ORAL | Status: DC
Start: 2024-04-09 — End: 2024-04-11
  Administered 2024-04-09 – 2024-04-11 (×3): 50 mg via ORAL
  Filled 2024-04-08 (×3): qty 1

## 2024-04-08 MED ORDER — MEMANTINE HCL 10 MG PO TABS
10.0000 mg | ORAL_TABLET | Freq: Two times a day (BID) | ORAL | Status: DC
  Administered 2024-04-09 – 2024-04-11 (×4): 10 mg via ORAL
  Filled 2024-04-08 (×4): qty 1

## 2024-04-08 MED ORDER — DONEPEZIL HCL 10 MG PO TABS
10.0000 mg | ORAL_TABLET | Freq: Every day | ORAL | Status: DC
Start: 2024-04-09 — End: 2024-04-11
  Administered 2024-04-09 – 2024-04-11 (×3): 10 mg via ORAL
  Filled 2024-04-08 (×3): qty 1

## 2024-04-08 MED ORDER — SODIUM CHLORIDE 0.9 % IV SOLN: INTRAVENOUS | Status: DC

## 2024-04-08 MED ORDER — LORAZEPAM 2 MG/ML IJ SOLN
1.0000 mg | Freq: Once | INTRAMUSCULAR | Status: AC
  Administered 2024-04-08: 1 mg via INTRAVENOUS
  Filled 2024-04-08: qty 1

## 2024-04-08 MED ORDER — HALOPERIDOL LACTATE 5 MG/ML IJ SOLN
1.0000 mg | Freq: Once | INTRAMUSCULAR | Status: AC
Start: 2024-04-09 — End: 2024-04-08
  Administered 2024-04-08: 1 mg via INTRAVENOUS
  Filled 2024-04-08: qty 1

## 2024-04-08 MED ORDER — STROKE: EARLY STAGES OF RECOVERY BOOK
Freq: Once | Status: AC
  Filled 2024-04-08: qty 1

## 2024-04-08 MED ORDER — HALOPERIDOL LACTATE 5 MG/ML IJ SOLN
1.0000 mg | Freq: Once | INTRAMUSCULAR | Status: AC
Start: 2024-04-08 — End: 2024-04-08
  Administered 2024-04-08: 1 mg via INTRAVENOUS
  Filled 2024-04-08: qty 1

## 2024-04-08 MED ORDER — EZETIMIBE 10 MG PO TABS
10.0000 mg | ORAL_TABLET | Freq: Every day | ORAL | Status: DC
Start: 2024-04-09 — End: 2024-04-11
  Administered 2024-04-09 – 2024-04-11 (×3): 10 mg via ORAL
  Filled 2024-04-08 (×3): qty 1

## 2024-04-08 NOTE — Progress Notes (Signed)
 Pt admitted from [facility] with strokelike symptoms, alert to self alone, very confused and agitated, figgetty , settled in bed with call light within pt's reach, pt continuously  removing his gown and sheets, trying to get out of bed, safety measures initiated, admit Doc and Neurology on call paged to be notified of patient's arrival, was however reassured and will continue to monitor. Obasogie-Asidi, Clance Baquero Efe

## 2024-04-08 NOTE — Subjective & Objective (Addendum)
 Wife reported that pt had an unwitnessed fall around 4 am she only found him around 8:30 AM She felt that he had slurred speech and delayed response at that time No localized weakness was noted at that time patient does have history of dementia and A-fib not on blood thinners no injury was reported This morning he did not want to go to breakfast because he was so achy from falling So she took him out to lunch this afternoon but he seem to be even more confused Had hard time figuring out how to eat and how to hold his fork try to drink from a cup of a closed lid was not answering her questions Although he has dementia at baseline with some confusion this which must worse from baseline. Otherwise patient has not had any cough or cold symptoms no fevers no chills no nausea no vomiting

## 2024-04-08 NOTE — H&P (Signed)
 Richard Frederick FMW:[id number] DOB: 1941-07-11 DOA: 04/08/2024     PCP: Dwight Trula SQUIBB, MD   Outpatient Specialists: CARDS: Zule[facility]  Patient arrived to ER on 04/08/24 at 1556 Referred by Attending Silvester Ales, MD   Patient coming from:    home Lives With family     Chief Complaint:   Chief Complaint  Patient presents with   Fall    HPI: Richard Frederick is a 83 y.o. male with medical history significant of dementia , DM2,   HTN, HLD  Presented with   confusion Wife reported that pt had an unwitnessed fall around 4 am she only found him around 8:30 AM She felt that he had slurred speech and delayed response at that time No localized weakness was noted at that time patient does have history of dementia and A-fib not on blood thinners no injury was reported This morning he did not want to go to breakfast because he was so achy from falling So she took him out to lunch this afternoon but he seem to be even more confused Had hard time figuring out how to eat and how to hold his fork try to drink from a cup of a closed lid was not answering her questions Although he has dementia at baseline with some confusion this which must worse from baseline. Otherwise patient has not had any cough or cold symptoms no fevers no chills no nausea no vomiting   He did let his wife know that he felt unwell last night He  has hx of severe sundowning  Wife says he have had similar episodes in the past where he got dehydrated and improved with IV fluid rehydration. He does not drink very well. Family is not ready at this time yet to transition to memory care  Denies significant ETOH intake   Does not smoke      Regarding pertinent Chronic problems:   Hyperlipidemia - on zetia   HTN  not on bp meds    DM 2 -   on   PO meds only,    Hx of possible eye stroke  - was on Plavix  no longer takes it    A. Fib -   atrial fibrillation CHA2DS2 vas score    6     Not on anticoagulation  secondary to Risk of Falls,          -  Rate control:  Currently controlled with toprolol  25          CKD stage IIIb baseline Cr 1.5 CrCl cannot be calculated (Unknown ideal weight.).  Lab Results  Component Value Date   CREATININE 1.43 (H) 04/08/2024   CREATININE 1.51 (H) 01/13/2024   CREATININE 1.15 06/23/2018   Lab Results  Component Value Date   NA 138 04/08/2024   CL 100 04/08/2024   K 4.2 04/08/2024   CO2 25 04/08/2024   BUN 20 04/08/2024   CREATININE 1.43 (H) 04/08/2024   GFRNONAA 48 (L) 04/08/2024   CALCIUM 10.2 04/08/2024   ALBUMIN 4.4 04/08/2024   GLUCOSE 115 (H) 04/08/2024   Dementia Aricept  Namenda  Zoloft    BPH - Flomax   While in ER:      Ativan  was given but had a paradoxic reaction   Lab Orders         Ethanol         Protime-INR         APTT         CBC  Differential         Comprehensive metabolic panel         CK         Urinalysis, Complete w Microscopic -Urine, Clean Catch         Hemoglobin A1c         CBG monitoring, ED      CT HEAD  NON acute   MRI brain  ordered  CXR - NON acute  Following Medications were ordered in ER: Medications  insulin  aspart (novoLOG ) injection 0-9 Units (has no administration in time range)  LORazepam  (ATIVAN ) injection 1 mg (1 mg Intravenous Given 04/08/24 1816)     ED Triage Vitals  Encounter Vitals Group     BP 04/08/24 1604 (!) 165/84     Girls Systolic BP Percentile --      Girls Diastolic BP Percentile --      Boys Systolic BP Percentile --      Boys Diastolic BP Percentile --      Pulse Rate 04/08/24 1604 68     Resp 04/08/24 1604 18     Temp 04/08/24 1604 97.9 F (36.6 C)     Temp Source 04/08/24 2000 Oral     SpO2 04/08/24 1604 95 %     Weight --      Height --      Head Circumference --      Peak Flow --      Pain Score 04/08/24 1606 0     Pain Loc --      Pain Education --      Exclude from Growth Chart --   UFJK(75)@      _________________________________________ Significant initial  Findings: Abnormal Labs Reviewed  COMPREHENSIVE METABOLIC PANEL WITH GFR - Abnormal; Notable for the following components:      Result Value   Glucose, Bld 115 (*)    Creatinine, Ser 1.43 (*)    GFR, Estimated 48 (*)    All other components within normal limits  CBG MONITORING, ED - Abnormal; Notable for the following components:   Glucose-Capillary 145 (*)    All other components within normal limits     _________________________ Troponin  ordered Cardiac Panel (last 3 results) Recent Labs    04/08/24 1620  CKTOTAL 70     ECG: Ordered Personally reviewed and interpreted by me showing: HR : 88 Rhythm: Atrial fibrillation Abnormal R-wave progression, early transition LVH with secondary repolarization abnormality Inferior infarct, old Anterior Q waves, possibly due to LVH QTC 458  BNP (last 3 results) Recent Labs    01/13/24 1039  BNP 32.2    The recent clinical data is shown below. Vitals:   04/08/24 1604 04/08/24 2000 04/08/24 2049  BP: (!) 165/84  (!) 160/95  Pulse: 68  84  Resp: 18  18  Temp: 97.9 F (36.6 C) 98.1 F (36.7 C) (!) 97.3 F (36.3 C)  TempSrc:  Oral Oral  SpO2: 95%  97%  WBC     Component Value Date/Time   WBC 10.2 04/08/2024 1620   LYMPHSABS 2.1 04/08/2024 1620   MONOABS 0.7 04/08/2024 1620   EOSABS 0.3 04/08/2024 1620   BASOSABS 0.1 04/08/2024 1620    UA ordered  __________________________________________________________ Recent Labs  Lab 04/08/24 1620  NA 138  K 4.2  CO2 25  GLUCOSE 115*  BUN 20  CREATININE 1.43*  CALCIUM 10.2    Cr  stable,  Lab Results  Component Value Date   CREATININE  1.43 (H) 04/08/2024   CREATININE 1.51 (H) 01/13/2024   CREATININE 1.15 06/23/2018    Recent Labs  Lab 04/08/24 1620  AST 20  ALT 23  ALKPHOS 71  BILITOT 0.2  PROT 7.2  ALBUMIN 4.4   Lab Results  Component Value Date   CALCIUM 10.2 04/08/2024  Plt: Lab  Results  Component Value Date   PLT 229 04/08/2024     Recent Labs  Lab 04/08/24 1620  WBC 10.2  NEUTROABS 7.0  HGB 15.6  HCT 48.1  MCV 88.1  PLT 229    HG/HCT  stable,     Component Value Date/Time   HGB 15.6 04/08/2024 1620   HCT 48.1 04/08/2024 1620   MCV 88.1 04/08/2024 1620  _______________________________________________ Hospitalist was called for admission for tia   The following Work up has been ordered so far:  Orders Placed This Encounter  Procedures   CT HEAD WO CONTRAST   CT Cervical Spine Wo Contrast   DG Chest Port 1 View   Ethanol   Protime-INR   APTT   CBC   Differential   Comprehensive metabolic panel   CK   Urinalysis, Complete w Microscopic -Urine, Clean Catch   Hemoglobin A1c   Diet NPO time specified   Vital signs   ED Cardiac monitoring   NIH Stroke Scale   Swallow screen   Initiate Carrier Fluid Protocol   If O2 sat <94% Administer O2 @ 2 Liters/Minute If O2 Sat < 94%, administer O2 at 2 liters/minute via nasal cannula.   Cardiac Monitoring - Continuous Indefinite   Apply Diabetes Mellitus Care Plan   STAT CBG when hypoglycemia is suspected. If treated, recheck every 15 minutes after each treatment until CBG >/= 70 mg/dl   Refer to Hypoglycemia Protocol Sidebar Report for treatment of CBG < 70 mg/dl   Consult to hospitalist   ED Pulse oximetry, continuous   CBG monitoring, ED   Saline lock IV   Place in observation (patient's expected length of stay will be less than 2 midnights)     OTHER Significant initial  Findings:  labs showing:     DM  labs:  HbA1C: No results for input(s): HGBA1C in the last 8760 hours.     CBG (last 3)  Recent Labs    04/08/24 1607  GLUCAP 145*          Cultures:    Component Value Date/Time   SDES  01/13/2024 1101    URINE, CLEAN CATCH Performed at [facility], [address], [place], [place] [address]    SPECREQUEST  01/13/2024 1101    NONE Performed at  [facility], [address] [place], [place] [address]    CULT  01/13/2024 1101    NO GROWTH Performed at [facility], [address], [place], [place] [address]    REPTSTATUS 01/14/2024 FINAL 01/13/2024 1101     Radiological Exams on Admission: DG Chest Port 1 View Result Date: 04/08/2024 EXAM: 1 VIEW XRAY OF THE CHEST 04/08/2024 07:27:00 PM COMPARISON: 01/13/2024 CLINICAL HISTORY: Altered mental status. Pt had a unwitnessed fall early this morning. Pt has altered mental status. Best obtainable chest xray due to patient not being able to follow commands. FINDINGS: LUNGS AND PLEURA: Low lung volumes. Bibasilar atelectasis. No focal pulmonary opacity. No pulmonary edema. No pleural effusion. No pneumothorax. HEART AND MEDIASTINUM: Atherosclerotic plaque. No acute abnormality of the cardiac and mediastinal silhouettes. BONES AND SOFT TISSUES: No acute  osseous abnormality. IMPRESSION: 1. No acute process. 2. Low lung volumes and bibasilar atelectasis. Electronically signed by: Norman Gatlin MD 04/08/2024 07:37 PM EDT RP Workstation: [id number]   CT Cervical Spine Wo Contrast Result Date: 04/08/2024 EXAM: CT CERVICAL SPINE WITHOUT CONTRAST 04/08/2024 04:54:32 PM TECHNIQUE: CT of the cervical spine was performed without the administration of intravenous contrast. Multiplanar reformatted images are provided for review. Automated exposure control, iterative reconstruction, and/or weight based adjustment of the mA/kV was utilized to reduce the radiation dose to as low as reasonably achievable. COMPARISON: None available. CLINICAL HISTORY: Neck trauma (Age >= 65y). 83 y/o male that fell around 0400 this morning. Wife reports slurred speech, delayed response, slower movements. Pt unaware of fall. VAN negative, no unilateral weakness noted. FINDINGS: CERVICAL SPINE: BONES AND ALIGNMENT: Cervical lordosis is maintained. 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 Trace anterolisthesis of C7 on  T1. No facet dislocation. DEGENERATIVE CHANGES: Facet arthrosis and uncovertebral hypertrophy at multiple levels. No high-grade osseous spinal canal stenosis. SOFT TISSUES: No prevertebral soft tissue swelling. IMPRESSION: 1. No acute abnormality of the cervical spine related to the reported neck trauma. Electronically signed by: Donnice Mania MD 04/08/2024 05:12 PM EDT RP Workstation: [id number]   CT HEAD WO CONTRAST Result Date: 04/08/2024 EXAM: CT HEAD WITHOUT CONTRAST 04/08/2024 04:54:32 PM TECHNIQUE: CT of the head was performed without the administration of intravenous contrast. Automated exposure control, iterative reconstruction, and/or weight based adjustment of the mA/kV was utilized to reduce the radiation dose to as low as reasonably achievable. COMPARISON: MRI head 06/23/2018 CLINICAL HISTORY: Mental status change, unknown cause. 83 y/o male that fell around 0400 this morning. Wife reports slurred speech, delayed response, slower movements. Pt unaware of fall. VAN negative, no unilateral weakness noted. FINDINGS: BRAIN AND VENTRICLES: No acute hemorrhage. No evidence of acute infarct. No hydrocephalus. No extra-axial collection. No mass effect or midline shift. Nonspecific hypoattenuation in the periventricular and subcortical white matter, most likely representing chronic microvascular ischemic changes. Mild generalized parenchymal volume loss. ORBITS: No acute abnormality. SINUSES: No acute abnormality. SOFT TISSUES AND SKULL: No acute soft tissue abnormality. No skull fracture. Bright lens replacement. IMPRESSION: 1. No acute intracranial abnormality. 2. Mild for age chronic microvascular ischemic changes. 3. Mild generalized parenchymal volume loss. Electronically signed by: Donnice Mania MD 04/08/2024 05:04 PM EDT RP Workstation: [id number]   _______________________________________________________________________________________________________ Latest  Blood pressure (!) 160/95, pulse 84,  temperature (!) 97.3 F (36.3 C), temperature source Oral, resp. rate 18, SpO2 97%.   Vitals  labs and radiology finding personally reviewed  Review of Systems:    Pertinent positives include:  confusion  Constitutional:  No weight loss, night sweats, Fevers, chills, fatigue, weight loss  HEENT:  No headaches, Difficulty swallowing,Tooth/dental problems,Sore throat,  No sneezing, itching, ear ache, nasal congestion, post nasal drip,  Cardio-vascular:  No chest pain, Orthopnea, PND, anasarca, dizziness, palpitations.no Bilateral lower extremity swelling  GI:  No heartburn, indigestion, abdominal pain, nausea, vomiting, diarrhea, change in bowel habits, loss of appetite, melena, blood in stool, hematemesis Resp:  no shortness of breath at rest. No dyspnea on exertion, No excess mucus, no productive cough, No non-productive cough, No coughing up of blood.No change in color of mucus.No wheezing. Skin:  no rash or lesions. No jaundice GU:  no dysuria, change in color of urine, no urgency or frequency. No straining to urinate.  No flank pain.  Musculoskeletal:  No joint pain or no joint swelling. No decreased range of motion. No back pain.  Psych:  No  change in mood or affect. No depression or anxiety. No memory loss.  Neuro: no localizing neurological complaints, no tingling, no weakness, no double vision, no gait abnormality, no slurred speech, no  All systems reviewed and apart from HOPI all are negative _______________________________________________________________________________________________ Past Medical History:   Past Medical History:  Diagnosis Date   A-fib (HCC)    Anxiety    Dementia (HCC)    Hyperlipemia    Hypertension    Type II diabetes mellitus (HCC)    Vision disturbance    Past Surgical History:  Procedure Laterality Date   CATARACT EXTRACTION W/ INTRAOCULAR LENS IMPLANT Right ~ 2013   COLONOSCOPY W/ BIOPSIES AND POLYPECTOMY     COLONOSCOPY WITH  PROPOFOL  N/A 04/10/2016   Procedure: COLONOSCOPY WITH PROPOFOL ;  Surgeon: Gladis MARLA Louder, MD;  Location: [facility];  Service: Endoscopy;  Laterality: N/A;   TRANSURETHRAL RESECTION OF PROSTATE  early 2000s    Social History:  Ambulatory  walker    reports that he has quit smoking. His smoking use included cigarettes. He has a 12 pack-year smoking history. He has quit using smokeless tobacco.  His smokeless tobacco use included chew. He reports that he does not drink alcohol and does not use drugs.   Family History: Family History  Problem Relation Age of Onset   Breast cancer Sister    Heart disease Sister    Stroke Mother    Cancer Father        unsure of type   ______________________________________________________________________________________________ Allergies: Allergies  Allergen Reactions   Amlodipine Other (See Comments)    Edema    Atorvastatin Other (See Comments)    Achy legs   Pravastatin Other (See Comments)    Achy legs   Rosuvastatin Other (See Comments)    Achy legs     Prior to Admission medications   Medication Sig Start Date End Date Taking? Authorizing Provider  clopidogrel  (PLAVIX ) 75 MG tablet Take 1 tablet (75 mg total) by mouth daily. Please request future refills from PCP. 06/29/19   Onita Duos, MD  Cyanocobalamin (VITAMIN B 12 PO) Take by mouth. 1000 mg daily    [provider]  donepezil  (ARICEPT ) 10 MG tablet Take 1 tablet by mouth daily. 04/06/18   [provider]  ezetimibe  (ZETIA ) 10 MG tablet Take 10 mg by mouth daily. 06/18/18   [provider]  lisinopril -hydrochlorothiazide  (PRINZIDE ,ZESTORETIC ) 20-12.5 MG tablet Take 1 tablet by mouth daily. 04/29/18   [provider]  memantine  (NAMENDA ) 10 MG tablet Take 10 mg by mouth 2 (two) times daily. 06/25/18   [provider]  metFORMIN  (GLUCOPHAGE ) 500 MG tablet Take by mouth daily with breakfast.    [provider]  Niacin  (VITAMIN B-3  PO) Take 1 tablet by mouth daily.    [provider]  sertraline  (ZOLOFT ) 50 MG tablet Take 50 mg by mouth daily. 06/25/18   [provider]  tamsulosin  (FLOMAX ) 0.4 MG CAPS capsule Take 0.4 mg by mouth daily. 04/29/18   [provider]  triamcinolone (KENALOG) 0.1 % paste Use as directed 1 application in the mouth or throat at bedtime. 03/26/18   [provider]    ___________________________________________________________________________________________________ Physical Exam:    04/08/2024    8:49 PM 04/08/2024    4:04 PM 03/10/2024    3:09 PM  Vitals with BMI  Height   5' 7  Weight   181 lbs  BMI   28.34  Systolic 160 165 852  Diastolic  95 84 87  Pulse 84 68 76     1. General:  in No Acute distress  Chronically ill -appearing 2. Psychological: Alert and  Oriented 3. Head/ENT:   Dry Mucous Membranes                          Head Non traumatic, neck supple                        Poor Dentition 4. SKIN:decreased Skin turgor,  Skin clean Dry and intact no rash    5. Heart: Regular rate and rhythm no  Murmur, no Rub or gallop 6. Lungs:  no wheezes or crackles   7. Abdomen: Soft,  non-tender, Non distended bowel sounds present 8. Lower extremities: no clubbing, cyanosis, no  edema 9. Neurologically strength 5 out of 5 in all 4 extremities cranial nerves II through XII intact 10. MSK: Normal range of motion    Chart has been reviewed  ______________________________________________________________________________________________  Assessment/Plan 83 y.o. male with medical history significant of dementia , DM2,   HTN, HLD Admitted for tia   Present on Admission:  TIA (transient ischemic attack)  Essential hypertension  Dementia with behavioral disturbance (HCC)  Paroxysmal A-fib (HCC)     DM (diabetes mellitus), type 2 (HCC)  - Order Sensitive SSI    -  check TSH and HgA1C  - Hold by mouth medications    Essential  hypertension Allow permissive htn  Dementia with behavioral disturbance (HCC) Severe sundowing noted Patient had poor response to Ativan  with paradoxical agitation. Attempted to use Haldol  Family at bedside If no improvement could also try to use Zyprexa May benefit from sitter at bedside Evaluate for any evidence of constipation or urinary retention patient is able to empty bladder KUB ordered to evaluate for any evidence of obstipation  TIA (transient ischemic attack) Unclear if true TIA vs acute delirium in the setting of chronic dementia  - will admit based on TIA/CVA protocol,        Monitor on Tele       /MRI await  results if patient unable to cooperate with MRI may need a repeat CT scan discussed this with family who understands       Carotid Doppler in AM        Echo to evaluate for possible embolic source,        obtain cardiac enzymes,  ECG,   Lipid panel, TSH.        Order PT/OT evaluation.        keep nothing by mouth until passes swallow eval  will need Speech pathology evaluation       Will make sure patient is on antiplatelet ASA 81         Allow permissive Hypertension keep BP <220/120      If evidence of CVA will need Neurology consult  Paroxysmal A-fib (HCC) Restart toprol  when able pt not on anticoagulation due to risk of falls   Other plan as per orders.  DVT prophylaxis:  SCD    Code Status:    Code Status: Prior FULL CODE  as per family  I had personally discussed CODE STATUS with family ACP has been reviewed   Family Communication:   Family not at  Bedside  plan of care was discussed on the phone with Wife,  Diet  Diet Orders (From admission, onward)     Start  Ordered   04/08/24 1620  Diet NPO time specified  Diet effective now       Comments: NPO until stroke swallow screen is complete   04/08/24 1620            Disposition Plan:    likely will need placement for rehabilitation,                       Following barriers for  discharge:                              Stroke work up is complete                                Consult Orders  (From admission, onward)           Start     Ordered   04/08/24 1803  Consult to hospitalist  Called [facility] for consult talked to [facility] at 6:09  Once       Provider:  (Not yet assigned)  Question Answer Comment  Place call to: [facility]   Reason for Consult Admit      04/08/24 1802                               Would benefit from PT/OT eval prior to DC  Ordered                                                   Transition of care consulted Case discussed with neurology if abnormal imaging noted in a.m. will need official neurology consult  Admission status:  ED Disposition     ED Disposition  Admit   Condition  --   Comment  Hospital Area[facility] [[id number]]  Level of Care: Telemetry Medical [104]  Interfacility transfer: Yes  Covid Evaluation: Asymptomatic - no recent exposure (last 10 days) testing not required  Diagnosis: TIA (transient ischemic attack) [832385]  Admitting Physician: Dellis Voght [[id number]]  Attending Physician: Wood Novacek [[id number]]  For patients discharging to extended facilities (i.e. SNF, AL, group homes or LTAC) initiate:: Discharge to SNF/Facility Placement COVID-19 Lab Testing Protocol         Obs   Level of care     tele  For 24H      Shiah Berhow 04/08/2024, 11:53 PM   [facility]     after 2 AM please page floor coverage   If 7AM-7PM, please contact the day team taking care of the patient using [email or web address]

## 2024-04-08 NOTE — ED Provider Notes (Signed)
 [facility] Provider Note   CSN: [id number] Arrival date & time: 04/08/24  1556     Patient presents with: Richard Frederick is a 83 y.o. male.   Patient is an 83 year old male with a history of atrial fibrillation not on anticoagulants, diabetes, hyperlipidemia, hypertension, dementia.  History is obtained from the patient's wife who is at bedside.  She states that patient had a fall last night but it was unwitnessed.  He was reportedly going to the bathroom during the night and sometimes he will go and sleep in his room so she was an alarm when he did not come back.  This morning she woke up around 830 and found him in the floor.  He had pulled the covers down and covered himself up.  She estimates that he laid on floor for about 4 hours.  He seemed to have been acting his normal self although she says they normally go out to breakfast and he did not want to go because he was achy from laying on the floor.  Otherwise he been acting normally.  They went out to lunch this afternoon and she noticed that he seemed more confused.  He was having a hard time figuring out how to eat and he would do things like try to drink his tea with a little on.  He was not answering her questions.  He does have dementia and typically has some confusion but this was different than his baseline.  She denies any recent illnesses.  No cough or cold symptoms.  No fevers.  No vomiting or diarrhea.  He denies any complaints of pain.       Prior to Admission medications   Medication Sig Start Date End Date Taking? Authorizing Provider  clopidogrel  (PLAVIX ) 75 MG tablet Take 1 tablet (75 mg total) by mouth daily. Please request future refills from PCP. 06/29/19   Onita Duos, MD  Cyanocobalamin (VITAMIN B 12 PO) Take by mouth. 1000 mg daily    [provider]  donepezil  (ARICEPT ) 10 MG tablet Take 1 tablet by mouth daily. 04/06/18   [provider]   ezetimibe  (ZETIA ) 10 MG tablet Take 10 mg by mouth daily. 06/18/18   [provider]  lisinopril -hydrochlorothiazide  (PRINZIDE ,ZESTORETIC ) 20-12.5 MG tablet Take 1 tablet by mouth daily. 04/29/18   [provider]  memantine  (NAMENDA ) 10 MG tablet Take 10 mg by mouth 2 (two) times daily. 06/25/18   [provider]  metFORMIN  (GLUCOPHAGE ) 500 MG tablet Take by mouth daily with breakfast.    [provider]  Niacin  (VITAMIN B-3 PO) Take 1 tablet by mouth daily.    [provider]  sertraline  (ZOLOFT ) 50 MG tablet Take 50 mg by mouth daily. 06/25/18   [provider]  tamsulosin  (FLOMAX ) 0.4 MG CAPS capsule Take 0.4 mg by mouth daily. 04/29/18   [provider]  triamcinolone (KENALOG) 0.1 % paste Use as directed 1 application in the mouth or throat at bedtime. 03/26/18   [provider]    Allergies: Amlodipine, Atorvastatin, Pravastatin, and Rosuvastatin    Review of Systems  Unable to perform ROS: Dementia    Updated Vital Signs BP (!) 165/84 (BP Location: Right Arm)   Pulse 68   Temp 97.9 F (36.6 C)   Resp 18   SpO2 95%   Physical Exam Constitutional:      Appearance: He is well-developed.  HENT:  Head: Normocephalic and atraumatic.  Eyes:     Extraocular Movements: Extraocular movements intact.     Pupils: Pupils are equal, round, and reactive to light.  Neck:     Comments: No pain to the cervical, thoracic or lumbosacral spine Cardiovascular:     Rate and Rhythm: Normal rate and regular rhythm.     Heart sounds: Normal heart sounds.  Pulmonary:     Effort: Pulmonary effort is normal. No respiratory distress.     Breath sounds: Normal breath sounds. No wheezing or rales.  Chest:     Chest wall: No tenderness.  Abdominal:     General: Bowel sounds are normal.     Palpations: Abdomen is soft.     Tenderness: There is no abdominal tenderness. There is no guarding or rebound.  Musculoskeletal:         General: Normal range of motion.     Comments: No pain on palpation or range of motion of the extremities, slight edema to lower extremities bilaterally  Lymphadenopathy:     Cervical: No cervical adenopathy.  Skin:    General: Skin is warm and dry.     Findings: No rash.  Neurological:     Mental Status: He is alert.     Comments: Patient is oriented to person and place, cranial nerves II through XII grossly intact, motor 5 out of 5 all extremities, sensation grossly intact to light touch all extremities, finger-nose is a little delayed but intact     (all labs ordered are listed, but only abnormal results are displayed) Labs Reviewed  CBG MONITORING, ED - Abnormal; Notable for the following components:      Result Value   Glucose-Capillary 145 (*)    All other components within normal limits  ETHANOL  PROTIME-INR  APTT  CBC  DIFFERENTIAL  COMPREHENSIVE METABOLIC PANEL WITH GFR  URINE DRUG SCREEN  CK  URINALYSIS, W/ REFLEX TO CULTURE (INFECTION SUSPECTED)    EKG: None  Radiology: No results found.   Procedures   Medications Ordered in the ED - No data to display                                  Medical Decision Making Amount and/or Complexity of Data Reviewed Labs: ordered. Radiology: ordered.  Risk Prescription drug management. Decision regarding hospitalization.   This patient presents to the ED for concern of altered mental status, this involves an extensive number of treatment options, and is a complaint that carries with it a high risk of complications and morbidity.  I considered the following differential and admission for this acute, potentially life threatening condition.  The differential diagnosis includes encephalopathy, infection, stroke, intracranial hemorrhage, TIA  MDM:    Patient is a 83 year old who presents with an abrupt onset of increased confusion while he was eating lunch today.  However he did have a fall during the night last  night.  On arrival to the ED, he is more back to his baseline.  He does not have any slurred speech.  No focal neurologic deficits.  No aphasia.  He is answering questions appropriately but is only oriented to person and place which is typical for him.  CT scan does not show any acute abnormality.  Labs reviewed and are nonconcerning.  Urine is pending.  He is high risk for stroke/TIA given his atrial fibrillation without anticoagulation.  Recommend admission for further evaluation and  possible TIA workup.  Discussed with Dr. Silvester he will admit the patient for further treatment.  (Labs, imaging, consults)  Labs: I Ordered, and personally interpreted labs.  The pertinent results include: Normal white count, electrolytes nonconcerning  Imaging Studies ordered: I ordered imaging studies including CT head, CT cervical spine I independently visualized and interpreted imaging. I agree with the radiologist interpretation  Additional history obtained from wife.  External records from outside source obtained and reviewed including history  Cardiac Monitoring: The patient was maintained on a cardiac monitor.  If on the cardiac monitor, I personally viewed and interpreted the cardiac monitored which showed an underlying rhythm of: Atrial fibrillation at a controlled rate  Reevaluation: After the interventions noted above, I reevaluated the patient and found that they have :stayed the same  Social Determinants of Health:    Disposition: Admit to hospital  Co morbidities that complicate the patient evaluation  Past Medical History:  Diagnosis Date   A-fib (HCC)    Anxiety    Dementia (HCC)    Hyperlipemia    Hypertension    Type II diabetes mellitus (HCC)    Vision disturbance      Medicines Meds ordered this encounter  Medications   LORazepam  (ATIVAN ) injection 1 mg   insulin  aspart (novoLOG ) injection 0-9 Units    Correction coverage::   Sensitive (thin, NPO, renal)    CBG < 70::    Implement Hypoglycemia Standing Orders and refer to Hypoglycemia Standing Orders sidebar report    CBG 70 - 120::   0 units    CBG 121 - 150::   1 unit    CBG 151 - 200::   2 units    CBG 201 - 250::   3 units    CBG 251 - 300::   5 units    CBG 301 - 350::   7 units    CBG 351 - 400:   9 units    CBG > 400:   call MD and obtain STAT lab verification   haloperidol  lactate (HALDOL ) injection 1 mg   ezetimibe  (ZETIA ) tablet 10 mg   sertraline  (ZOLOFT ) tablet 50 mg   tamsulosin  (FLOMAX ) capsule 0.4 mg    stroke: early stages of recovery book   aspirin  EC tablet 81 mg   0.9 %  sodium chloride  infusion    I have reviewed the patients home medicines and have made adjustments as needed  Problem List / ED Course: Problem List Items Addressed This Visit   None            Final diagnoses:  None    ED Discharge Orders     None          Lenor Hollering, MD 04/08/24 2323

## 2024-04-08 NOTE — Assessment & Plan Note (Signed)
 Restart toprol  when able pt not on anticoagulation due to risk of falls

## 2024-04-08 NOTE — Assessment & Plan Note (Signed)
 Allow permissive htn ?

## 2024-04-08 NOTE — Assessment & Plan Note (Signed)
 -  Order Sensitive  SSI     -  check TSH and HgA1C  - Hold by mouth medications*

## 2024-04-08 NOTE — Assessment & Plan Note (Addendum)
 Unclear if true TIA vs acute delirium in the setting of chronic dementia  - will admit based on TIA/CVA protocol,        Monitor on Tele       /MRI await  results if patient unable to cooperate with MRI may need a repeat CT scan discussed this with family who understands       Carotid Doppler in AM        Echo to evaluate for possible embolic source,        obtain cardiac enzymes,  ECG,   Lipid panel, TSH.        Order PT/OT evaluation.        keep nothing by mouth until passes swallow eval  will need Speech pathology evaluation       Will make sure patient is on antiplatelet ASA 81         Allow permissive Hypertension keep BP <220/120      If evidence of CVA will need Neurology consult

## 2024-04-08 NOTE — ED Notes (Signed)
 EDP in triage room

## 2024-04-08 NOTE — Assessment & Plan Note (Addendum)
 Severe sundowing noted Patient had poor response to Ativan  with paradoxical agitation. Attempted to use Haldol  Family at bedside If no improvement could also try to use Zyprexa May benefit from sitter at bedside Evaluate for any evidence of constipation or urinary retention patient is able to empty bladder KUB ordered to evaluate for any evidence of obstipation

## 2024-04-08 NOTE — ED Triage Notes (Signed)
 Wife states pt fell around 0400 this morning, unwitnessed fall.   Wife reports slurred speech, delayed response, slower movements. Pt unaware of fall. Oriented to self and place.  VAN negative, no unilateral weakness noted  (left leg weakness at baseline)   Unknown if LOC. Denies blood thinners.  Pt denies complaint of pain or acute symptoms.

## 2024-04-08 NOTE — ED Notes (Signed)
 Pt. Sleeping in floor when wife found him last night and realized he had fallen out of bed.

## 2024-04-09 ENCOUNTER — Observation Stay (HOSPITAL_COMMUNITY)

## 2024-04-09 ENCOUNTER — Ambulatory Visit: Admitting: Podiatry

## 2024-04-09 DIAGNOSIS — G9341 Metabolic encephalopathy: Secondary | ICD-10-CM | POA: Diagnosis present

## 2024-04-09 DIAGNOSIS — R918 Other nonspecific abnormal finding of lung field: Secondary | ICD-10-CM | POA: Diagnosis not present

## 2024-04-09 DIAGNOSIS — E1122 Type 2 diabetes mellitus with diabetic chronic kidney disease: Secondary | ICD-10-CM | POA: Diagnosis present

## 2024-04-09 DIAGNOSIS — E785 Hyperlipidemia, unspecified: Secondary | ICD-10-CM | POA: Diagnosis present

## 2024-04-09 DIAGNOSIS — G9340 Encephalopathy, unspecified: Secondary | ICD-10-CM | POA: Diagnosis present

## 2024-04-09 DIAGNOSIS — Z66 Do not resuscitate: Secondary | ICD-10-CM | POA: Diagnosis present

## 2024-04-09 DIAGNOSIS — Z87891 Personal history of nicotine dependence: Secondary | ICD-10-CM | POA: Diagnosis not present

## 2024-04-09 DIAGNOSIS — Z803 Family history of malignant neoplasm of breast: Secondary | ICD-10-CM | POA: Diagnosis not present

## 2024-04-09 DIAGNOSIS — Z8249 Family history of ischemic heart disease and other diseases of the circulatory system: Secondary | ICD-10-CM | POA: Diagnosis not present

## 2024-04-09 DIAGNOSIS — R296 Repeated falls: Secondary | ICD-10-CM | POA: Diagnosis present

## 2024-04-09 DIAGNOSIS — K5900 Constipation, unspecified: Secondary | ICD-10-CM | POA: Diagnosis not present

## 2024-04-09 DIAGNOSIS — R410 Disorientation, unspecified: Secondary | ICD-10-CM | POA: Diagnosis not present

## 2024-04-09 DIAGNOSIS — R4182 Altered mental status, unspecified: Secondary | ICD-10-CM | POA: Diagnosis present

## 2024-04-09 DIAGNOSIS — Z888 Allergy status to other drugs, medicaments and biological substances status: Secondary | ICD-10-CM | POA: Diagnosis not present

## 2024-04-09 DIAGNOSIS — N1832 Chronic kidney disease, stage 3b: Secondary | ICD-10-CM | POA: Diagnosis present

## 2024-04-09 DIAGNOSIS — I4891 Unspecified atrial fibrillation: Secondary | ICD-10-CM | POA: Diagnosis not present

## 2024-04-09 DIAGNOSIS — Z7984 Long term (current) use of oral hypoglycemic drugs: Secondary | ICD-10-CM | POA: Diagnosis not present

## 2024-04-09 DIAGNOSIS — I480 Paroxysmal atrial fibrillation: Secondary | ICD-10-CM | POA: Diagnosis present

## 2024-04-09 DIAGNOSIS — F03918 Unspecified dementia, unspecified severity, with other behavioral disturbance: Secondary | ICD-10-CM | POA: Diagnosis not present

## 2024-04-09 DIAGNOSIS — F03C18 Unspecified dementia, severe, with other behavioral disturbance: Secondary | ICD-10-CM | POA: Diagnosis present

## 2024-04-09 DIAGNOSIS — Z823 Family history of stroke: Secondary | ICD-10-CM | POA: Diagnosis not present

## 2024-04-09 DIAGNOSIS — Z79899 Other long term (current) drug therapy: Secondary | ICD-10-CM | POA: Diagnosis not present

## 2024-04-09 DIAGNOSIS — N400 Enlarged prostate without lower urinary tract symptoms: Secondary | ICD-10-CM | POA: Diagnosis present

## 2024-04-09 DIAGNOSIS — I10 Essential (primary) hypertension: Secondary | ICD-10-CM

## 2024-04-09 DIAGNOSIS — G459 Transient cerebral ischemic attack, unspecified: Secondary | ICD-10-CM | POA: Diagnosis not present

## 2024-04-09 DIAGNOSIS — I129 Hypertensive chronic kidney disease with stage 1 through stage 4 chronic kidney disease, or unspecified chronic kidney disease: Secondary | ICD-10-CM | POA: Diagnosis present

## 2024-04-09 DIAGNOSIS — Z7902 Long term (current) use of antithrombotics/antiplatelets: Secondary | ICD-10-CM | POA: Diagnosis not present

## 2024-04-09 LAB — GLUCOSE, CAPILLARY
Glucose-Capillary: 124 mg/dL — ABNORMAL HIGH (ref 70–99)
Glucose-Capillary: 137 mg/dL — ABNORMAL HIGH (ref 70–99)
Glucose-Capillary: 145 mg/dL — ABNORMAL HIGH (ref 70–99)
Glucose-Capillary: 162 mg/dL — ABNORMAL HIGH (ref 70–99)
Glucose-Capillary: 163 mg/dL — ABNORMAL HIGH (ref 70–99)
Glucose-Capillary: 169 mg/dL — ABNORMAL HIGH (ref 70–99)

## 2024-04-09 LAB — LIPID PANEL
Cholesterol: 193 mg/dL (ref 0–200)
HDL: 47 mg/dL (ref >40)
LDL Cholesterol: 131 mg/dL — ABNORMAL HIGH (ref 0–99)
Total CHOL/HDL Ratio: 4.1 ratio
Triglycerides: 76 mg/dL (ref <150)
VLDL: 15 mg/dL (ref 0–40)

## 2024-04-09 LAB — ECHOCARDIOGRAM COMPLETE

## 2024-04-09 LAB — TROPONIN I (HIGH SENSITIVITY): Troponin I (High Sensitivity): 11 ng/L (ref <18)

## 2024-04-09 MED ORDER — HYDRALAZINE HCL 20 MG/ML IJ SOLN
10.0000 mg | Freq: Four times a day (QID) | INTRAMUSCULAR | Status: DC | PRN
  Administered 2024-04-09: 10 mg via INTRAVENOUS
  Filled 2024-04-09 (×2): qty 1

## 2024-04-09 MED ORDER — HYDROCHLOROTHIAZIDE 12.5 MG PO TABS
12.5000 mg | ORAL_TABLET | Freq: Every day | ORAL | Status: DC
  Administered 2024-04-09 – 2024-04-10 (×2): 12.5 mg via ORAL
  Filled 2024-04-09 (×2): qty 1

## 2024-04-09 MED ORDER — ENOXAPARIN SODIUM 40 MG/0.4ML IJ SOSY
40.0000 mg | PREFILLED_SYRINGE | Freq: Every day | INTRAMUSCULAR | Status: DC
  Administered 2024-04-09 – 2024-04-10 (×2): 40 mg via SUBCUTANEOUS
  Filled 2024-04-09 (×2): qty 0.4

## 2024-04-09 MED ORDER — MELATONIN 3 MG PO TABS
3.0000 mg | ORAL_TABLET | Freq: Every day | ORAL | Status: DC
  Administered 2024-04-09 – 2024-04-10 (×2): 3 mg via ORAL
  Filled 2024-04-09 (×2): qty 1

## 2024-04-09 MED ORDER — LISINOPRIL-HYDROCHLOROTHIAZIDE 20-12.5 MG PO TABS
1.0000 | ORAL_TABLET | Freq: Every day | ORAL | Status: DC
Start: 2024-04-09 — End: 2024-04-09

## 2024-04-09 MED ORDER — CLOPIDOGREL BISULFATE 75 MG PO TABS
75.0000 mg | ORAL_TABLET | Freq: Every day | ORAL | Status: DC
  Administered 2024-04-11: 75 mg via ORAL
  Filled 2024-04-09 (×3): qty 1

## 2024-04-09 MED ORDER — QUETIAPINE FUMARATE 25 MG PO TABS: 25.0000 mg | ORAL_TABLET | Freq: Every evening | ORAL | Status: DC | PRN

## 2024-04-09 MED ORDER — LISINOPRIL-HYDROCHLOROTHIAZIDE 20-12.5 MG PO TABS: 1.0000 | ORAL_TABLET | Freq: Every day | ORAL | Status: DC

## 2024-04-09 MED ORDER — QUETIAPINE FUMARATE 25 MG PO TABS
25.0000 mg | ORAL_TABLET | Freq: Once | ORAL | Status: AC
  Administered 2024-04-09: 25 mg via ORAL
  Filled 2024-04-09: qty 1

## 2024-04-09 MED ORDER — HALOPERIDOL LACTATE 5 MG/ML IJ SOLN: 2.0000 mg | Freq: Four times a day (QID) | INTRAMUSCULAR | Status: DC | PRN

## 2024-04-09 MED ORDER — METOPROLOL SUCCINATE ER 25 MG PO TB24
25.0000 mg | ORAL_TABLET | Freq: Every day | ORAL | Status: DC
  Administered 2024-04-09 – 2024-04-11 (×3): 25 mg via ORAL
  Filled 2024-04-09 (×3): qty 1

## 2024-04-09 MED ORDER — HALOPERIDOL LACTATE 5 MG/ML IJ SOLN: 2.0000 mg | Freq: Once | INTRAMUSCULAR | Status: DC

## 2024-04-09 MED ORDER — LISINOPRIL 20 MG PO TABS
20.0000 mg | ORAL_TABLET | Freq: Every day | ORAL | Status: DC
  Administered 2024-04-11: 20 mg via ORAL
  Filled 2024-04-09 (×3): qty 1

## 2024-04-09 MED ORDER — ORAL CARE MOUTH RINSE: 15.0000 mL | OROMUCOSAL | Status: DC | PRN

## 2024-04-09 MED ORDER — LISINOPRIL 10 MG PO TABS: 10.0000 mg | ORAL_TABLET | Freq: Every day | ORAL | Status: DC

## 2024-04-09 NOTE — Progress Notes (Signed)
 Carotid exam is completed. Kleo Dungee, RVT

## 2024-04-09 NOTE — Evaluation (Addendum)
 Physical Therapy Evaluation Patient Details Name: Richard Frederick MRN: [id number] DOB: 1941-05-27 Today's Date: 04/09/2024  History of Present Illness  83 y.o. male presents to [facility] 04/08/24 after unwitnessed fall and confusion. CT head/neck negative. Pt with acute metabolic encephalopathy. PMHx: atrial fibrillation not on anticoagulation, dementia with severe sundowning diabetes mellitus type 2 essential hypertension and dementia   Clinical Impression  PLOF and home set-up taken from pt's wife as pt has a history of dementia. PTA, pt would occasionally need assist for bed mobility and to walk with a rollator. In today's session, pt required ModA for bed mobility and ModAx2 to stand with RW. Pt was able to ambulate 63ft with ModA +2 for close chair follow. Pt was unsteady with significantly forward flexed trunk and difficulty managing RW. Pt has 24/7 level of assist from wife available at home. Recommending post-acute rehab <3hrs to decrease caregiver burden and improve mobility. Discussed with wife that if they were to go home with pt's currently mobility level, she would either need +2 assist or to use a hoyer lift and manual WC for mobility. Pt would benefit from acute skilled PT with current functional limitations listed below (see PT Problem List). Acute PT to follow.  HR 117-148 BPM with mobility         If plan is discharge home, recommend the following: A lot of help with walking and/or transfers;A lot of help with bathing/dressing/bathroom;Direct supervision/assist for medications management;Direct supervision/assist for financial management;Assist for transportation;Help with stairs or ramp for entrance   Can travel by private vehicle   No    Equipment Recommendations Hoyer lift     Functional Status Assessment Patient has had a recent decline in their functional status and demonstrates the ability to make significant improvements in function in a reasonable and predictable amount of  time.     Precautions / Restrictions Precautions Precautions: Fall Recall of Precautions/Restrictions: Impaired Restrictions Weight Bearing Restrictions Per Provider Order: No      Mobility  Bed Mobility Overal bed mobility: Needs Assistance Bed Mobility: Supine to Sit    Supine to sit: Mod assist    General bed mobility comments: able to bring LE's off EOB, ModA to raise trunk. Assist to shift hips forward with use of bed pad    Transfers Overall transfer level: Needs assistance Equipment used: Rolling walker (2 wheels) Transfers: Sit to/from Stand Sit to Stand: Mod assist, +2 physical assistance, +2 safety/equipment    General transfer comment: ModAx2 to boost-up and steady. Cues for hand placement with assist needed to place hand on RW handle    Ambulation/Gait Ambulation/Gait assistance: Mod assist, +2 safety/equipment Gait Distance (Feet): 20 Feet Assistive device: Rolling walker (2 wheels) Gait Pattern/deviations: Step-through pattern, Decreased stride length, Trunk flexed, Drifts right/left Gait velocity: decr     General Gait Details: tendency to drift to the left with increased difficulty managing RW. Significantly forward flexed posture with cues for RW proximity, increased step length, and for upright posture. +2 for close chair follow     Balance Overall balance assessment: Mild deficits observed, not formally tested, History of Falls       Pertinent Vitals/Pain Pain Assessment Pain Assessment: No/denies pain    Home Living Family/patient expects to be discharged to:: Private residence Living Arrangements: Spouse/significant other Available Help at Discharge: Family;Available 24 hours/day Type of Home: House Home Access: Stairs to enter   [employer] of Steps: 1 (1/2 step)   Home Layout: One level Home Equipment: Shower seat;Grab  bars - tub/shower;Rolling Walker (2 wheels);Cane - single point;Rollator (4 wheels);BSC/3in1;Tub  bench;Wheelchair - manual (rollator converts into WC with foot plates) Additional Comments: Wife is looking at getting a PCA at home    Prior Function Prior Level of Function : Needs assist;History of Falls (last six months)  Mobility Comments: Occasionally needs assist for bed mobility, ambulates with rollator. At least one fall ADLs Comments: Wife provides supervision for bathing and occasionally assists with dressing. Handles finances and medications     Extremity/Trunk Assessment   Upper Extremity Assessment Upper Extremity Assessment: Defer to OT evaluation    Lower Extremity Assessment Lower Extremity Assessment: Generalized weakness    Cervical / Trunk Assessment Cervical / Trunk Assessment: Kyphotic  Communication   Communication Communication: No apparent difficulties    Cognition Arousal: Alert Behavior During Therapy: Flat affect   PT - Cognitive impairments: History of cognitive impairments    PT - Cognition Comments: Increased time to process commands with multi-modal cueing needed. Limited verbalizations during session Following commands: Impaired Following commands impaired: Follows one step commands inconsistently, Follows one step commands with increased time     Cueing Cueing Techniques: Verbal cues, Tactile cues, Visual cues     General Comments General comments (skin integrity, edema, etc.): Wife present and supportive during eval     PT Assessment Patient needs continued PT services  PT Problem List Decreased strength;Decreased activity tolerance;Decreased balance;Decreased mobility;Decreased cognition;Decreased knowledge of use of DME;Decreased safety awareness       PT Treatment Interventions DME instruction;Gait training;Stair training;Functional mobility training;Therapeutic activities;Therapeutic exercise;Balance training;Neuromuscular re-education;Patient/family education;Wheelchair mobility training    PT Goals (Current goals can be found  in the Care Plan section)  Acute Rehab PT Goals Patient Stated Goal: to go home PT Goal Formulation: With patient/family Time For Goal Achievement: 04/23/24 Potential to Achieve Goals: Fair    Frequency Min 2X/week        AM-PAC PT 6 Clicks Mobility  Outcome Measure Help needed turning from your back to your side while in a flat bed without using bedrails?: A Lot Help needed moving from lying on your back to sitting on the side of a flat bed without using bedrails?: A Lot Help needed moving to and from a bed to a chair (including a wheelchair)?: Total Help needed standing up from a chair using your arms (e.g., wheelchair or bedside chair)?: Total Help needed to walk in hospital room?: A Lot Help needed climbing 3-5 steps with a railing? : Total 6 Click Score: 9    End of Session Equipment Utilized During Treatment: Gait belt Activity Tolerance: Patient tolerated treatment well Patient left: in chair;with call bell/phone within reach;with chair alarm set Nurse Communication: Mobility status;Other (comment) (HR) PT Visit Diagnosis: Unsteadiness on feet (R26.81);Other abnormalities of gait and mobility (R26.89);Muscle weakness (generalized) (M62.81);History of falling (Z91.81)    Time: 8691-8664 PT Time Calculation (min) (ACUTE ONLY): 27 min   Charges:   PT Evaluation $PT Eval Moderate Complexity: 1 Mod   PT General Charges $$ ACUTE PT VISIT: 1 Visit       Kate ORN, PT, DPT Secure Chat Preferred  Rehab Office [phone]   Kate BRAVO Wendolyn 04/09/2024, 3:55 PM

## 2024-04-09 NOTE — Evaluation (Signed)
 Occupational Therapy Evaluation Patient Details Name: Richard Frederick MRN: [id number] DOB: 1941/03/12 Today's Date: 04/09/2024   History of Present Illness   83 y.o. male presents to [facility] 04/08/24 after unwitnessed fall and confusion. CT head/neck negative. Pt with acute metabolic encephalopathy. 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 Pt currently presenting with impaired ability to initiate and sequence tasks, needing max A to sin A  for ADLs. Pt ambulating with mod A+2 using RW but with strong forward flexed posture, utilized chair follow for safety as lean seemed to slowly be progressing with Oob activity. OT to continue following pt acutely to progress pt as able. Discussed DC options with pt spouse and she preferred to be home vs SNF given pt's medical hx of dementia. Pt family would need HH services and a HH aid to provide full care for pt.       If plan is discharge home, recommend the following:   A lot of help with walking and/or transfers;Two people to help with walking and/or transfers;A lot of help with bathing/dressing/bathroom;Assistance with cooking/housework;Direct supervision/assist for financial management;Direct supervision/assist for medications management     Functional Status Assessment   Patient has had a recent decline in their functional status and demonstrates the ability to make significant improvements in function in a reasonable and predictable amount of time.     Equipment Recommendations   Audiological scientist for [employer]         Precautions/Restrictions   Precautions Precautions: Fall Recall of Precautions/Restrictions: Impaired Restrictions [employer]  Restrictions Per Provider Order: No     Mobility Bed Mobility Overal bed mobility: Needs Assistance Bed Mobility: Supine to Sit     Supine to sit: Mod assist     General bed mobility comments: able to bring LE's off EOB, ModA to raise trunk. Assist to shift hips forward with use of bed pad    Transfers Overall transfer level: Needs assistance Equipment used: Rolling walker (2 wheels) Transfers: Sit to/from Stand Sit to Stand: Mod assist, +2 physical assistance, +2 safety/equipment           General transfer comment: ModAx2 to boost-up and steady. Cues for hand placement with assist needed to place hand on RW handle      Balance Overall balance assessment: Mild deficits observed, not formally tested, History of Falls                                         ADL either performed or assessed with clinical judgement   ADL Overall ADL's : Needs assistance/impaired Eating/Feeding: Sitting;Moderate assistance   Grooming: Sitting;Minimal assistance   Upper Body Bathing: Sitting;Minimal assistance   Lower Body Bathing: Moderate assistance;Sitting/lateral leans   Upper Body Dressing : Sitting;Moderate assistance   Lower Body Dressing: Maximal assistance;Sitting/lateral leans Lower Body Dressing Details (indicate cue type and reason): don bilat socks, impaired initiation needing cues to re-engage in task. Toilet Transfer: +2 for safety/equipment;Moderate assistance;Ambulation;Rolling walker (2 wheels)   Toileting- Clothing Manipulation and Hygiene: Moderate assistance;+2 for safety/equipment;Sit to/from stand       Functional mobility during ADLs: Moderate assistance;+2 for physical assistance;Rolling walker (2 wheels)  Vision         Perception         Praxis         Pertinent Vitals/Pain Pain Assessment Pain Assessment: No/denies pain     Extremity/Trunk Assessment Upper Extremity Assessment Upper Extremity Assessment: Difficult  to assess due to impaired cognition;Generalized weakness   Lower Extremity Assessment Lower Extremity Assessment: Generalized weakness   Cervical / Trunk Assessment Cervical / Trunk Assessment: Kyphotic   Communication Communication Communication: No apparent difficulties   Cognition Arousal: Alert Behavior During Therapy: Flat affect Cognition: History of cognitive impairments                               Following commands: Impaired Following commands impaired: Follows one step commands inconsistently, Follows one step commands with increased time     Cueing  General Comments   Cueing Techniques: Verbal cues;Tactile cues;Visual cues  Wife present and supportive during eval. HR up to 148 bpm in afib with activity, down to 117 bpm by end of session but remains in afib.   Exercises     Shoulder Instructions      Home Living Family/patient expects to be discharged to:: Private residence Living Arrangements: Spouse/significant other Available Help at Discharge: Family;Available 24 hours/day Type of Home: House Home Access: Stairs to enter [employer] of Steps: 1 (1/2 step)   Home Layout: One level     Bathroom Shower/Tub: Producer, television/film/video: Handicapped height     Home Equipment: Shower seat;Grab bars - tub/shower;Rolling Environmental consultant (2 wheels);Cane - single point;Rollator (4 wheels);BSC/3in1;Tub bench;Wheelchair - manual   Additional Comments: Wife is looking at getting a PCA at home  Lives With: Spouse    Prior Functioning/Environment Prior Level of Function : Needs assist;History of Falls (last six months)             Mobility Comments: Occasionally needs assist for bed mobility, ambulates with rollator. At least one fall ADLs Comments: Wife provides supervision for bathing and occasionally assists with dressing. Handles finances and medications    OT Problem List: Decreased cognition;Impaired balance (sitting and/or  standing);Decreased strength;Decreased safety awareness   OT Treatment/Interventions: Self-care/ADL training;Patient/family education;Therapeutic exercise;Balance training;Therapeutic activities;DME and/or AE instruction      OT Goals(Current goals can be found in the care plan section)   Acute Rehab OT Goals Patient Stated Goal: To return home and back to baseline. OT Goal Formulation: With family Time For Goal Achievement: 04/23/24 Potential to Achieve Goals: Fair ADL Goals Pt Will Perform Grooming: standing;with min assist Pt Will Perform Upper Body Bathing: sitting;with set-up Pt Will Perform Upper Body Dressing: sitting;with min assist Pt Will Perform Lower Body Dressing: sit to/from stand;with min assist;sitting/lateral leans Pt Will Transfer to Toilet: ambulating;with min assist   OT Frequency:  Min 2X/week    Co-evaluation              AM-PAC OT 6 Clicks Daily Activity     Outcome Measure Help from another person eating meals?: A Little Help from another person taking care of personal grooming?: A Little Help from another person toileting, which includes using toliet, bedpan, or urinal?: A Lot Help from another person bathing (including washing, rinsing, drying)?: A Lot Help from another person to put on and taking off regular upper body clothing?: A Lot Help from another person to put on and taking off regular lower body clothing?: A Lot 6 Click Score: 14  End of Session Equipment Utilized During Treatment: Gait belt;Rolling walker (2 wheels) Nurse Communication: Mobility status  Activity Tolerance: Patient tolerated treatment well Patient left: with call bell/phone within reach;in chair;with chair alarm set;with family/visitor present  OT Visit Diagnosis: Unsteadiness on feet (R26.81);Other abnormalities of gait and mobility (R26.89);History of falling (Z91.81);Other symptoms and signs involving cognitive function                Time: 8686-8661 OT Time  Calculation (min): 25 min Charges:  OT General Charges $OT Visit: 1 Visit OT Evaluation $OT Eval Moderate Complexity: 1 Mod  04/09/2024  AB, OTR/L  Acute Rehabilitation Services  Office: [phone]   Curtistine JONETTA Das 04/09/2024, 4:13 PM

## 2024-04-09 NOTE — Evaluation (Signed)
 Clinical/Bedside Swallow Evaluation Patient Details  Name: Richard Frederick MRN: [id number] Date of Birth: 1941-05-23  Today's Date: 04/09/2024 Time: SLP Start Time (ACUTE ONLY): 1355 SLP Stop Time (ACUTE ONLY): 1418 SLP Time Calculation (min) (ACUTE ONLY): 23 min  Past Medical History:  Past Medical History:  Diagnosis Date   A-fib (HCC)    Anxiety    Dementia (HCC)    Hyperlipemia    Hypertension    Type II diabetes mellitus (HCC)    Vision disturbance    Past Surgical History:  Past Surgical History:  Procedure Laterality Date   CATARACT EXTRACTION W/ INTRAOCULAR LENS IMPLANT Right ~ 2013   COLONOSCOPY W/ BIOPSIES AND POLYPECTOMY     COLONOSCOPY WITH PROPOFOL  N/A 04/10/2016   Procedure: COLONOSCOPY WITH PROPOFOL ;  Surgeon: Gladis MARLA Louder, MD;  Location: [facility];  Service: Endoscopy;  Laterality: N/A;   TRANSURETHRAL RESECTION OF PROSTATE  early 2000s   HPI:  Richard Frederick is an 83 y.o. 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  CT head and neck showed no acute findings, chest x-ray showed no acute processes.    Assessment / Plan / Recommendation  Clinical Impression  Pt demonstrates a mild oral dysphagia associated with fleeting attention. Pt orall holds sips of water and needed a verbal cue to swallow in 100% of trials. Could not self feed, needed cues to initaite and sustain attention. Otherwise no coughing or concern for aspiration. Initiated a regular diet and thin liquids and assisted wife in ordering an early dinner that she can assist him with. No f/u needed for swallowing, will address attention with cognitive therapy. SLP Visit Diagnosis:  Dysphagia, unspecified (R13.10)    Aspiration Risk  No limitations    Diet Recommendation Regular;Thin liquid    Liquid Administration via: Cup;Straw Medication Administration: Whole meds with liquid Supervision: Full supervision/cueing for compensatory strategies Compensations: Minimize environmental distractions Postural Changes: Seated upright at 90 degrees    Other  Recommendations       Assistance Recommended at Discharge    Functional Status Assessment    Frequency and Duration            Prognosis        Swallow Study   General HPI: Richard Frederick is an 83 y.o. 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  CT head and neck showed no acute findings, chest x-ray showed no acute processes. Type of Study: Bedside Swallow Evaluation Previous Swallow Assessment: none Diet Prior to this Study: NPO Temperature Spikes Noted: No Respiratory Status: Room air History of Recent Intubation: No Behavior/Cognition: Alert;Distractible Oral Cavity Assessment: Within Functional Limits Oral Care Completed by SLP: No Oral Cavity - Dentition: Adequate natural dentition Vision: Functional for self-feeding Self-Feeding Abilities: Needs assist Patient Positioning: Upright in chair Baseline Vocal Quality: Normal Volitional Cough: Strong Volitional Swallow: Able to elicit    Oral/Motor/Sensory Function Overall Oral Motor/Sensory Function:  Within functional limits   Ice Chips     Thin Liquid Thin Liquid: Impaired Presentation: Straw Oral Phase Functional Implications: Oral holding    Nectar Thick Nectar Thick Liquid: Not tested   Honey Thick Honey Thick Liquid: Not  tested   Puree Puree: Within functional limits Presentation: Spoon   Solid     Solid: Within functional limits      Richard Frederick, [name] 04/09/2024,2:22 PM

## 2024-04-09 NOTE — Progress Notes (Signed)
 [facility] PROGRESS NOTE    Progress Note  Richard Frederick  FMW:[id number] DOB: 1941/01/01 DOA: 04/08/2024 PCP: Dwight Trula SQUIBB, MD     Brief Narrative:   Richard Frederick is an 83 y.o. 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  CT head and neck showed no acute findings, chest x-ray showed no acute processes.    Assessment/Plan:   Acute metabolic encephalopathy: HgbA1c 5.4, fasting lipid panel HDL 47 LDL 131 Discussed with the wife and she would like not to proceed with MRI as she I agree she believes he will not be able to lay down for 45 minutes for an MRI.  She does not want the MRI under anesthesia. Consult PT do a swallowing evaluation. Transthoracic Echo, pending Prior to admission he was on Plavix , can continue Plavix . No events on telemetry. He is nonfocal on physical exam, therefore relates he is returning to baseline. Will evaluate him throughout the day and if he is able to tolerate his diet and passed a swallow evaluation we can discharge him later on this afternoon.  Essential hypertension: Resume all antihypertensive medication.  Severe dementia with behavioral disturbances: He has a history of severe sundowning, try to avoid Ativan . Resume home meds Haldol  as needed for agitation.  DM (diabetes mellitus), type 2 (HCC): Hold metformin  discontinue sliding scale insulin  his A1c is 5.4.  History of A-fib [facility]): Rate controlled, not a candidate for anticoagulation.  DVT prophylaxis: lovenox  Family Communication:wife Status is: Observation The patient remains OBS appropriate and will d/c before 2 midnights.    Code Status:  Code Status  History     Date Active Date Inactive Code Status Order ID Comments User Context   04/24/2016 1900 04/25/2016 1721 Full Code [id number]  Verdene Purchase, MD Inpatient         IV Access:   Peripheral IV   Procedures and diagnostic studies:   DG Chest Port 1 View Result Date: 04/08/2024 EXAM: 1 VIEW XRAY OF THE CHEST 04/08/2024 07:27:00 PM COMPARISON: 01/13/2024 CLINICAL HISTORY: Altered mental status. Pt had a unwitnessed fall early this morning. Pt has altered mental status. Best obtainable chest xray due to patient not being able to follow commands. FINDINGS: LUNGS AND PLEURA: Low lung volumes. Bibasilar atelectasis. No focal pulmonary opacity. No pulmonary edema. No pleural effusion. No pneumothorax. HEART AND MEDIASTINUM: Atherosclerotic plaque. No acute abnormality of the cardiac and mediastinal silhouettes. BONES AND SOFT TISSUES: No acute osseous abnormality. IMPRESSION: 1. No acute process. 2. Low lung volumes and bibasilar atelectasis. Electronically signed by: Norman Gatlin MD 04/08/2024 07:37 PM EDT RP Workstation: [id number]   CT Cervical Spine Wo Contrast Result Date: 04/08/2024 EXAM: CT CERVICAL SPINE WITHOUT CONTRAST 04/08/2024 04:54:32 PM TECHNIQUE: CT of the cervical spine was performed without the administration of intravenous contrast. Multiplanar reformatted images are provided for review. Automated exposure control, iterative reconstruction, and/or weight based adjustment of the mA/kV was utilized to reduce the radiation dose to as low as reasonably achievable. COMPARISON: None available. CLINICAL HISTORY: Neck trauma (Age >= 65y). 83 y/o male that fell around 0400 this morning. Wife reports slurred speech, delayed response, slower movements. Pt unaware of fall. VAN negative, no unilateral weakness noted. FINDINGS:  CERVICAL SPINE: BONES AND ALIGNMENT: Cervical lordosis is maintained. 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 Trace anterolisthesis of C7 on T1. No facet dislocation.  DEGENERATIVE CHANGES: Facet arthrosis and uncovertebral hypertrophy at multiple levels. No high-grade osseous spinal canal stenosis. SOFT TISSUES: No prevertebral soft tissue swelling. IMPRESSION: 1. No acute abnormality of the cervical spine related to the reported neck trauma. Electronically signed by: Donnice Mania MD 04/08/2024 05:12 PM EDT RP Workstation: [id number]   CT HEAD WO CONTRAST Result Date: 04/08/2024 EXAM: CT HEAD WITHOUT CONTRAST 04/08/2024 04:54:32 PM TECHNIQUE: CT of the head was performed without the administration of intravenous contrast. Automated exposure control, iterative reconstruction, and/or weight based adjustment of the mA/kV was utilized to reduce the radiation dose to as low as reasonably achievable. COMPARISON: MRI head 06/23/2018 CLINICAL HISTORY: Mental status change, unknown cause. 83 y/o male that fell around 0400 this morning. Wife reports slurred speech, delayed response, slower movements. Pt unaware of fall. VAN negative, no unilateral weakness noted. FINDINGS: BRAIN AND VENTRICLES: No acute hemorrhage. No evidence of acute infarct. No hydrocephalus. No extra-axial collection. No mass effect or midline shift. Nonspecific hypoattenuation in the periventricular and subcortical white matter, most likely representing chronic microvascular ischemic changes. Mild generalized parenchymal volume loss. ORBITS: No acute abnormality. SINUSES: No acute abnormality. SOFT TISSUES AND SKULL: No acute soft tissue abnormality. No skull fracture. Bright lens replacement. IMPRESSION: 1. No acute intracranial abnormality. 2. Mild for age chronic microvascular ischemic changes. 3. Mild generalized parenchymal volume loss. Electronically signed by: Donnice Mania MD 04/08/2024 05:04 PM EDT RP Workstation: [id number]     Medical Consultants:   None.   Subjective:    Richard Frederick no complaints, only answering yes or no questions  Objective:    Vitals:   04/08/24 2000 04/08/24  2049 04/08/24 2346 04/09/24 0345  BP:  (!) 160/95 (!) 179/100 (!) 188/115  Pulse:  84 83 92  Resp:  18 18 19   Temp: 98.1 F (36.7 C) (!) 97.3 F (36.3 C) 97.8 F (36.6 C) 98 F (36.7 C)  TempSrc: Oral Oral Oral Oral  SpO2:  97% 95% 94%   SpO2: 94 %   Intake/Output Summary (Last 24 hours) at 04/09/2024 9380 Last data filed at 04/09/2024 0300 Gross per 24 hour  Intake 415.49 ml  Output 100 ml  Net 315.49 ml   There were no vitals filed for this visit.  Exam: General exam: In no acute distress. Respiratory system: Good air movement and clear to auscultation. Cardiovascular system: S1 & S2 heard, RRR. No JVD. Gastrointestinal system: Abdomen is nondistended, soft and nontender.  Central nervous system: None oriented x 1 moving all 4 extremities without any difficulties. Extremities: No pedal edema. Skin: No rashes, lesions or ulcers Psychiatry: No judgment or insight of medical condition.   Data Reviewed:    Labs: Basic Metabolic Panel: Recent Labs  Lab 04/08/24 1620 04/08/24 2223  NA 138  --   K 4.2  --   CL 100  --   CO2 25  --   GLUCOSE 115*  --   BUN 20  --   CREATININE 1.43*  --   CALCIUM 10.2  --   MG  --  2.0  PHOS  --  3.5   GFR CrCl cannot be calculated (Unknown ideal weight.). Liver Function Tests: Recent Labs  Lab 04/08/24 1620  AST 20  ALT 23  ALKPHOS 71  BILITOT 0.2  PROT 7.2  ALBUMIN 4.4   No results for input(s): LIPASE,  AMYLASE in the last 168 hours. No results for input(s): AMMONIA in the last 168 hours. Coagulation profile Recent Labs  Lab 04/08/24 1620  INR 1.0   COVID-19 Labs  No results for input(s): DDIMER, FERRITIN, LDH, CRP in the last 72 hours.  Lab Results  Component Value Date   SARSCOV2NAA NEGATIVE 01/13/2024    CBC: Recent Labs  Lab 04/08/24 1620  WBC 10.2  NEUTROABS 7.0  HGB 15.6  HCT 48.1  MCV 88.1  PLT 229   Cardiac Enzymes: Recent Labs  Lab 04/08/24 1620  CKTOTAL 70   BNP  (last 3 results) No results for input(s): PROBNP in the last 8760 hours. CBG: Recent Labs  Lab 04/08/24 1607 04/08/24 2149 04/08/24 2342 04/09/24 0346  GLUCAP 145* 83 86 145*   D-Dimer: No results for input(s): DDIMER in the last 72 hours. Hgb A1c: Recent Labs    04/08/24 2223  HGBA1C 5.4   Lipid Profile: Recent Labs    04/09/24 0000  CHOL 193  HDL 47  LDLCALC 131*  TRIG 76  CHOLHDL 4.1   Thyroid function studies: No results for input(s): TSH, T4TOTAL, T3FREE, THYROIDAB in the last 72 hours.  Invalid input(s): FREET3 Anemia work up: No results for input(s): VITAMINB12, FOLATE, FERRITIN, TIBC, IRON, RETICCTPCT in the last 72 hours. Sepsis Labs: Recent Labs  Lab 04/08/24 1620  WBC 10.2   Microbiology No results found for this or any previous visit (from the past 240 hours).   Medications:     stroke: early stages of recovery book   Does not apply Once   aspirin  EC  81 mg Oral Daily   donepezil   10 mg Oral Daily   ezetimibe   10 mg Oral Daily   insulin  aspart  0-9 Units Subcutaneous Q4H   memantine   10 mg Oral BID   metoprolol  succinate  25 mg Oral Daily   sertraline   50 mg Oral Daily   tamsulosin   0.4 mg Oral Daily   Continuous Infusions:  sodium chloride  100 mL/hr at 04/08/24 2250      LOS: 0 days   Richard Frederick  [facility]  04/09/2024, 6:19 AM

## 2024-04-09 NOTE — Progress Notes (Signed)
  Echocardiogram 2D Echocardiogram has been performed.  Juliene JINNY Rucks 04/09/2024, 3:51 PM

## 2024-04-09 NOTE — Progress Notes (Signed)
 Patient is having difficulty following commands. It took around 20 minutes for him to take his medications orally.

## 2024-04-09 NOTE — Evaluation (Signed)
 Speech Language Pathology Evaluation Patient Details Name: FITZ Frederick MRN: [id number] DOB: 1941/03/11 Today's Date: 04/09/2024 Time: 8644-8581 SLP Time Calculation (min) (ACUTE ONLY): 23 min  Problem List:  Patient Active Problem List   Diagnosis Date Noted   Encephalopathy acute 04/09/2024   TIA (transient ischemic attack) 04/08/2024   Dementia with behavioral disturbance (HCC) 04/08/2024   Paroxysmal A-fib (HCC) 04/08/2024   Memory loss 07/04/2018   VI nerve palsy, 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   Vertigo 04/24/2016   Essential hypertension 04/24/2016   DM (diabetes mellitus), type 2 (HCC) 04/24/2016   Past Medical History:  Past Medical History:  Diagnosis Date   A-fib (HCC)    Anxiety    Dementia (HCC)    Hyperlipemia    Hypertension    Type II diabetes mellitus (HCC)    Vision disturbance    Past Surgical History:  Past Surgical History:  Procedure Laterality Date   CATARACT EXTRACTION W/ INTRAOCULAR LENS IMPLANT Right ~ 2013   COLONOSCOPY W/ BIOPSIES AND POLYPECTOMY     COLONOSCOPY WITH PROPOFOL  N/A 04/10/2016   Procedure: COLONOSCOPY WITH PROPOFOL ;  Surgeon: Gladis MARLA Louder, MD;  Location: [facility];  Service: Endoscopy;  Laterality: N/A;   TRANSURETHRAL RESECTION OF PROSTATE  early 2000s   HPI:  COPELAN MAULTSBY is an 83 y.o. 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  CT head and neck showed no acute findings, chest x-ray showed no acute processes.   Assessment / Plan / Recommendation Clinical Impression  Pt demonstrates decreased cognitive function from baseline. Pt needs constant verbal and visual cues to focus and sustain attention to any simple verbal  or functional tasks. Gaze drifts of to window during self feeding, or after being asked a simple biographical question he would typically be able to answer. He can name objects and follow one step commands and speech is clear but hypophonic. Will f/u to facilitate cognitive recovery. Discussed findings with wife.    SLP Assessment  SLP Recommendation/Assessment: Patient needs continued Speech Language Pathology Services SLP Visit Diagnosis: Cognitive communication deficit (R41.841)     Assistance Recommended at Discharge  Frequent or constant Supervision/Assistance  Functional Status Assessment Patient has had a recent decline in their functional status and demonstrates the ability to make significant improvements in function in a reasonable and predictable amount of time.  Frequency and Duration min 1 x/week  2 weeks      SLP Evaluation Cognition  Overall Cognitive Status: Impaired/Different from baseline Arousal/Alertness: Awake/alert Orientation Level: Oriented to person;Disoriented to place;Disoriented to time;Disoriented to situation Attention: Focused;Sustained Focused Attention: Impaired Focused Attention Impairment: Verbal basic;Functional basic Sustained Attention: Impaired Sustained Attention Impairment: Verbal basic;Functional basic Memory: Impaired Memory Impairment: Retrieval deficit Awareness: Impaired Awareness Impairment: Intellectual impairment;Emergent impairment Problem Solving: Impaired Problem Solving Impairment: Verbal basic;Functional basic Executive Function: Initiating Initiating: Impaired Initiating Impairment: Functional basic Safety/Judgment: Impaired       Comprehension  Auditory Comprehension Overall Auditory Comprehension: Appears within functional limits for tasks assessed    Expression Verbal Expression Overall Verbal Expression: Appears within functional limits for tasks assessed   Oral / Motor  Oral Motor/Sensory Function Overall Oral  Motor/Sensory Function: Within functional limits Motor Speech Overall Motor Speech: Appears  within functional limits for tasks assessed            Akeen Ledyard, Consuelo Richard 04/09/2024, 2:28 PM

## 2024-04-10 DIAGNOSIS — G459 Transient cerebral ischemic attack, unspecified: Secondary | ICD-10-CM | POA: Diagnosis not present

## 2024-04-10 LAB — GLUCOSE, CAPILLARY
Glucose-Capillary: 122 mg/dL — ABNORMAL HIGH (ref 70–99)
Glucose-Capillary: 92 mg/dL (ref 70–99)
Glucose-Capillary: 113 mg/dL — ABNORMAL HIGH (ref 70–99)
Glucose-Capillary: 159 mg/dL — ABNORMAL HIGH (ref 70–99)
Glucose-Capillary: 123 mg/dL — ABNORMAL HIGH (ref 70–99)
Glucose-Capillary: 99 mg/dL (ref 70–99)

## 2024-04-10 MED ORDER — SODIUM CHLORIDE 0.9 % IV SOLN: INTRAVENOUS | Status: AC

## 2024-04-10 NOTE — TOC Initial Note (Signed)
 Transition of Care [facility]) - Initial/Assessment Note    Patient Details  Name: Richard Frederick MRN: [id number] Date of Birth: 12-Jan-1941  Transition [facility] Care [facility]) CM/SW Contact:    Sudie Erminio Deems, RN Phone Number: 04/10/2024, 12:03 PM  Clinical Narrative: Patient presented for fall. PTA patient was from home with spouse. Initial plan was for SNF; recommendations have since been changed to home health. Plan will be to return home with home health. Spouse has utilized [facility] in the past and she wants to use them again. Referral submitted to [facility] for [facility] RN, PT/OT. Office to call with visit times. Patient has DME shower chair-bench, 3n1, raised toilet seat, and spouse states he will receive a hoyer lift from the neighbor. Spouse states that the patient can transition home in private vehicle and a neighbor can assist getting him into the home. Spouse asked if the patient can dc today-mentioned to MD. No further needs identified from the Inpatient Case Manager.                   Expected Discharge Plan: Home w Home Health Services Barriers to Discharge: No Barriers Identified   Patient Goals and CMS Choice Patient states their goals for this hospitalization and ongoing recovery are:: wants to return home with spouse.   Choice offered to / list presented to : Spouse (Spouse has used [facility] in the past and wants to use them.)      Expected Discharge Plan and Services In-house Referral: NA Discharge Planning Services: CM Consult Post Acute Care Choice: Home Health Living arrangements for the past 2 months: Single Family Home                   DME Agency: NA       HH Arranged: NA          Prior Living Arrangements/Services Living arrangements for the past 2 months: Single Family Home Lives with:: Spouse Patient language and need for interpreter reviewed:: Yes Do you feel safe going back to the place where you live?: Yes      Need for Family Participation  in Patient Care: Yes (Comment) Care giver support system in place?: Yes (comment) Current home services: DME (shower chair and bench, 3n1, raised toilet seat.) Criminal Activity/Legal Involvement Pertinent to Current Situation/Hospitalization: No - Comment as needed  Activities of Daily Living   ADL Screening (condition at time of admission) Independently performs ADLs?: Yes (appropriate for developmental age) (with Wife helped) Is the patient deaf or have difficulty hearing?: No Does the patient have difficulty seeing, even when wearing glasses/contacts?: Yes Does the patient have difficulty concentrating, remembering, or making decisions?: Yes  Permission Sought/Granted Permission sought to share information with : Case Manager, Family Supports, Oceanographer granted to share information with : Yes, Verbal Permission Granted     Permission granted to share info w AGENCY: [facility]        Emotional Assessment Appearance:: Appears stated age Attitude/Demeanor/Rapport: Engaged Affect (typically observed): Appropriate   Alcohol / Substance Use: Not Applicable Psych Involvement: No (comment)  Admission diagnosis:  TIA (transient ischemic attack) [G45.9] Encephalopathy acute [G93.40] Patient Active Problem List   Diagnosis Date Noted   Encephalopathy acute 04/09/2024   Dementia with behavioral disturbance (HCC) 04/08/2024   Paroxysmal A-fib (HCC) 04/08/2024   Memory loss 07/04/2018   VI nerve palsy, 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 [place]), [place] - [address] [place] [address] Phone: [phone] Fax: [phone]     Social Drivers of Health (SDOH) Social History: SDOH Screenings   Food Insecurity: No Food Insecurity (04/09/2024)   Housing: Low Risk  (04/09/2024)  Transportation Needs: No Transportation Needs (04/09/2024)  Utilities: Not At Risk (04/09/2024)  Social Connections: Socially Integrated (04/09/2024)  Tobacco Use: Medium Risk (04/08/2024)   SDOH Interventions:     Readmission Risk Interventions     No data to display

## 2024-04-10 NOTE — Progress Notes (Signed)
 Physical Therapy Treatment Patient Details Name: Richard Frederick MRN: [id number] DOB: 1940-12-14 Today's Date: 04/10/2024   History of Present Illness 83 y.o. male presents to [facility] 04/08/24 after unwitnessed fall and confusion. CT head/neck negative. Pt with acute metabolic encephalopathy. PMHx: atrial fibrillation not on anticoagulation, dementia with severe sundowning diabetes mellitus type 2 essential hypertension and dementia    PT Comments  Pt with increased alertness and engagement in today's session. Pt was inconsistent during the session with level of assistance needed for mobility. Multiple repetitions of sit<>stand required ModAx2, however, pt was able to perform one rep with CGA. Pt was able to perform step-pivot with ModA +2 for safety and use of RW. Pt continues to have decreased safety awareness with frequent cueing necessary throughout session. Wife plans to take pt home with assist from PCA. Discussed using the hoyer lift if she is alone and to perform step-pivot transfers with help of PCA. Pt/wife verbalized understanding and feel comfortable with current plan. Acute PT to continue to follow.    If plan is discharge home, recommend the following: A lot of help with walking and/or transfers;A lot of help with bathing/dressing/bathroom;Direct supervision/assist for medications management;Direct supervision/assist for financial management;Assist for transportation;Help with stairs or ramp for entrance   Can travel by private vehicle     No  Equipment Recommendations  Hoyer lift       Precautions / Restrictions Precautions Precautions: Fall Recall of Precautions/Restrictions: Impaired Restrictions Weight Bearing Restrictions Per Provider Order: No     Mobility  Bed Mobility Overal bed mobility: Needs Assistance Bed Mobility: Supine to Sit    Supine to sit: Mod assist    General bed mobility comments: able to bring LE's off EOB, ModA to raise trunk    Transfers Overall  transfer level: Needs assistance Equipment used: Rolling walker (2 wheels) Transfers: Sit to/from Stand, Bed to chair/wheelchair/BSC Sit to Stand: Mod assist, +2 physical assistance, +2 safety/equipment, Contact guard assist   Step pivot transfers: Mod assist, +2 physical assistance, +2 safety/equipment     General transfer comment: Inconsistent with level of assist needed. Fluctuated between ModAx2 to one repetition with CGA and multiple attempts. Able to perform step-pivot with ModA +2 for safety. Assist needed throughout for safety and to shift hips    Ambulation/Gait Ambulation/Gait assistance: Mod assist, +2 safety/equipment Gait Distance (Feet): 15 Feet Assistive device: Rolling walker (2 wheels) Gait Pattern/deviations: Step-through pattern, Decreased stride length, Trunk flexed, Drifts right/left Gait velocity: decr    General Gait Details: MinA when ambulating in a straight line with ModA necessary when turning due to left latern lean and difficulty managing RW. +2 for safety. Cues throughout for upright posture and RW proximity     Balance Overall balance assessment: Mild deficits observed, not formally tested, History of Falls           Communication Communication Communication: No apparent difficulties  Cognition Arousal: Alert Behavior During Therapy: Flat affect   PT - Cognitive impairments: History of cognitive impairments    PT - Cognition Comments: Increased alertness and engagement with therapy this date. Decreased safety awareness with multimodal cues necessary Following commands: Impaired Following commands impaired: Follows one step commands inconsistently, Follows one step commands with increased time    Cueing Cueing Techniques: Verbal cues, Tactile cues, Visual cues     General Comments General comments (skin integrity, edema, etc.): Wife present during beginning and end of session. Discussed using hoyer lift if she is alone and performing step-pivot  transfers  when she has help of PCA.      Pertinent Vitals/Pain Pain Assessment Pain Assessment: No/denies pain     PT Goals (current goals can now be found in the care plan section) Acute Rehab PT Goals Patient Stated Goal: to go home PT Goal Formulation: With patient/family Time For Goal Achievement: 04/23/24 Potential to Achieve Goals: Fair Progress towards PT goals: Progressing toward goals    Frequency    Min 2X/week           Co-evaluation   Reason for Co-Treatment: For patient/therapist safety;To address functional/ADL transfers;Necessary to address cognition/behavior during functional activity PT goals addressed during session: Mobility/safety with mobility;Balance;Proper use of DME        AM-PAC PT 6 Clicks Mobility   Outcome Measure  Help needed turning from your back to your side while in a flat bed without using bedrails?: A Lot Help needed moving from lying on your back to sitting on the side of a flat bed without using bedrails?: A Lot Help needed moving to and from a bed to a chair (including a wheelchair)?: Total Help needed standing up from a chair using your arms (e.g., wheelchair or bedside chair)?: Total Help needed to walk in hospital room?: A Lot Help needed climbing 3-5 steps with a railing? : Total 6 Click Score: 9    End of Session Equipment Utilized During Treatment: Gait belt Activity Tolerance: Patient tolerated treatment well Patient left: in chair;with call bell/phone within reach;with chair alarm set;with family/visitor present Nurse Communication: Mobility status PT Visit Diagnosis: Unsteadiness on feet (R26.81);Other abnormalities of gait and mobility (R26.89);Muscle weakness (generalized) (M62.81);History of falling (Z91.81)     Time: 9142-9074 PT Time Calculation (min) (ACUTE ONLY): 28 min  Charges:    $Gait Training: 8-22 mins PT General Charges $$ ACUTE PT VISIT: 1 Visit                    Kate ORN, PT, DPT Secure  Chat Preferred  Rehab Office [phone]   Kate BRAVO Wendolyn 04/10/2024, 11:28 AM

## 2024-04-10 NOTE — Plan of Care (Signed)
   Problem: Education: Goal: Knowledge of General Education information will improve Description Including pain rating scale, medication(s)/side effects and non-pharmacologic comfort measures Outcome: Progressing

## 2024-04-10 NOTE — TOC CAGE-AID Note (Signed)
 Transition of Care [facility]) - CAGE-AID Screening   Patient Details  Name: Richard Frederick MRN: [id number] Date of Birth: 1940/09/30  Transition [facility]) CM/SW Contact:    Sarann Tregre E Akio Hudnall, LCSW Phone Number: 04/10/2024, 9:04 AM   Clinical Narrative: Memory impairment.   CAGE-AID Screening: Substance Abuse Screening unable to be completed due to: : Patient unable to participate

## 2024-04-10 NOTE — Progress Notes (Signed)
 Occupational Therapy Treatment Patient Details Name: Richard Frederick MRN: [id number] DOB: Sep 19, 1940 Today's Date: 04/10/2024   History of present illness 83 y.o. male [facility]08/24 after unwitnessed fall and confusion. CT head/neck negative. Pt with acute metabolic encephalopathy. PMHx: atrial fibrillation not on anticoagulation, dementia with severe sundowning diabetes mellitus type 2 essential hypertension and dementia   OT comments  Pt much more alert this session, following more commands and initiating more within functional tasks. He continues to display decreased safety awareness, pt needing mod A +2 for STS and ambulation with RW, did have moments where he was able to stand with CGA but significantly increased time and ext assist to hold down the RW. OT to continue following pt and progress as able. Cotx PT was able to discussed these things with pt spouse and she is agreeable to use hoyer to aide in transfer when she does not have additional assist.       If plan is discharge home, recommend the following:  A lot of help with walking and/or transfers;Two people to help with walking and/or transfers;A lot of help with bathing/dressing/bathroom;Assistance with cooking/housework;Direct supervision/assist for financial management;Direct supervision/assist for medications management   Equipment Recommendations  Hoyer lift    Recommendations for Other Services      Precautions / Restrictions Precautions Precautions: Fall Recall of Precautions/Restrictions: Impaired Restrictions Weight Bearing Restrictions Per Provider Order: No       Mobility Bed Mobility Overal bed mobility: Needs Assistance Bed Mobility: Supine to Sit     Supine to sit: Mod assist     General bed mobility comments: able to bring LE's off EOB, ModA to raise trunk    Transfers Overall transfer level: Needs assistance Equipment used: Rolling walker (2 wheels) Transfers: Sit to/from Stand, Bed  to chair/wheelchair/BSC Sit to Stand: Mod assist, +2 physical assistance, +2 safety/equipment, Contact guard assist     Step pivot transfers: Mod assist, +2 physical assistance, +2 safety/equipment     General transfer comment: Inconsistent with level of assist needed. Fluctuated between ModAx2 to one repetition with CGA and multiple attempts. Able to perform step-pivot with ModA +2 for safety. Assist needed throughout for safety and to shift hips     Balance Overall balance assessment: Mild deficits observed, not formally tested, History of Falls                                         ADL either performed or assessed with clinical judgement   ADL       Grooming: Sitting;Wash/dry face;Contact guard assist               Lower Body Dressing: Sitting/lateral leans;Moderate assistance               Functional mobility during ADLs: Moderate assistance;+2 for physical assistance;Rolling walker (2 wheels)      Extremity/Trunk Assessment Upper Extremity Assessment Upper Extremity Assessment: Generalized weakness   Lower Extremity Assessment Lower Extremity Assessment: Generalized weakness   Cervical / Trunk Assessment Cervical / Trunk Assessment: Kyphotic    Vision       Perception     Praxis     Communication Communication Communication: No apparent difficulties   Cognition   Behavior During Therapy: Flat affect Cognition: History of cognitive impairments     Awareness: Online awareness impaired, Intellectual awareness impaired     Executive functioning impairment (select  all impairments): Problem solving OT - Cognition Comments: impaired safety awareness.                 Following commands: Impaired Following commands impaired: Follows one step commands inconsistently, Follows one step commands with increased time      Cueing   Cueing Techniques: Verbal cues, Tactile cues, Visual cues  Exercises      Shoulder  Instructions       General Comments wife was not present to see pt during mobility and transfers, she stepped out for a moment but PT returned to discuss the session with family.    Pertinent Vitals/ Pain       Pain Assessment Pain Assessment: No/denies pain  Home Living                                          Prior Functioning/Environment              Frequency  Min 2X/week        Progress Toward Goals  OT Goals(current goals can now be found in the care plan section)  Progress towards OT goals: Progressing toward goals  Acute Rehab OT Goals Patient Stated Goal: to return home and back to baseline. OT Goal Formulation: With family Time For Goal Achievement: 04/23/24 Potential to Achieve Goals: Fair  Plan      Co-evaluation      Reason for Co-Treatment: For patient/therapist safety;To address functional/ADL transfers;Necessary to address cognition/behavior during functional activity PT goals addressed during session: Mobility/safety with mobility;Balance;Proper use of DME OT goals addressed during session: ADL's and self-care;Proper use of Adaptive equipment and DME      AM-PAC OT 6 Clicks Daily Activity     Outcome Measure   Help from another person eating meals?: A Little Help from another person taking care of personal grooming?: A Little Help from another person toileting, which includes using toliet, bedpan, or urinal?: A Lot Help from another person bathing (including washing, rinsing, drying)?: A Lot Help from another person to put on and taking off regular upper body clothing?: A Lot Help from another person to put on and taking off regular lower body clothing?: A Lot 6 Click Score: 14    End of Session Equipment Utilized During Treatment: Gait belt;Rolling walker (2 wheels)  OT Visit Diagnosis: Unsteadiness on feet (R26.81);Other abnormalities of gait and mobility (R26.89);History of falling (Z91.81);Other symptoms and signs  involving cognitive function   Activity Tolerance Patient tolerated treatment well   Patient Left with call bell/phone within reach;in chair;with chair alarm set;with family/visitor present   Nurse Communication Mobility status        Time: 9141-9075 OT Time Calculation (min): 26 min  Charges: OT General Charges $OT Visit: 1 Visit OT Treatments $Therapeutic Activity: 8-22 mins  04/10/2024  AB, OTR/L  Acute Rehabilitation Services  Office: [phone]   Curtistine JONETTA Das 04/10/2024, 11:52 AM

## 2024-04-10 NOTE — Progress Notes (Signed)
 [facility] PROGRESS NOTE    Progress Note  Richard Frederick  FMW:[id number] DOB: March 21, 1941 DOA: 04/08/2024 PCP: Richard Trula SQUIBB, MD     Brief Narrative:   Richard Frederick is an 83 y.o. 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  CT head and neck showed no acute findings, chest x-ray showed no acute processes.    Assessment/Plan:   Acute metabolic encephalopathy: HgbA1c 5.4, fasting lipid panel HDL 47 LDL 131 Consult PT who recommended skilled nursing facility, as he needs a two-person assist. Will have PT evaluate him again today.  He passed a swallow evaluation. Transthoracic Echo 60% no valve abnormalities. Prior to admission he was on Plavix , can continue Plavix . Had serial oral intake yesterday. Will see how he does today.  Essential hypertension: Blood pressure is improved after restarting all of his antihypertensive medication. Continue to monitor.  Severe dementia with behavioral disturbances: He has a history of severe sundowning, try to avoid Ativan . No events overnight he did receive melatonin.  DM (diabetes mellitus), type 2 (HCC): Hold metformin  discontinue sliding scale insulin  his A1c is 5.4.  History of A-fib [facility]): Rate controlled, not a candidate for anticoagulation due to frequent falls.  He was not on anticoagulation prior to admission  DVT prophylaxis: lovenox  Family Communication:wife Status is: Observation The patient remains OBS appropriate and will d/c before 2 midnights.    Code Status:  Code Status History     Date Active Date Inactive Code Status Order ID Comments User Context   04/24/2016 1900 04/25/2016 1721 Full Code  [id number]  Richard Purchase, MD Inpatient         IV Access:   Peripheral IV   Procedures and diagnostic studies:   ECHOCARDIOGRAM COMPLETE Result Date: 04/09/2024    ECHOCARDIOGRAM REPORT   Patient Name:   Richard Frederick Date of Exam: 04/09/2024 Medical Rec #:  [id number]        Height:       67.0 in Accession #:    [id number]       Weight:       181.0 lb Date of Birth:  Dec 19, 1940        BSA:          1.938 m Patient Age:    83 years         BP:           188/115 mmHg Patient Gender: M                HR:           100 bpm. Exam Location:  Inpatient Procedure: 2D Echo, Cardiac Doppler and Color Doppler (Both Spectral and Color            Flow Doppler were utilized during procedure). Indications:    TIA  History:        Patient has prior history of Echocardiogram examinations, most                 recent 07/18/2018. TIA, Arrythmias:Atrial Fibrillation; Risk                 Factors:Hypertension, Diabetes and Former Smoker.  Sonographer:    Richard Frederick  Referring Phys: [id number] Richard Frederick  Sonographer Comments: No parasternal window, no subcostal window and Technically difficult study due to poor echo windows. Image acquisition challenging due to patient behavioral factors. and Image acquisition challenging due to uncooperative patient. IMPRESSIONS  1. Left ventricular ejection fraction, by estimation, is 55 to 60%. The left ventricle has normal function. Left ventricular diastolic function could not be evaluated.  2. Right ventricular systolic function was not well visualized. The right ventricular size is not well visualized.  3. The mitral valve was not well visualized.  4. The aortic valve was not well visualized. FINDINGS  Left Ventricle: Left ventricular ejection fraction, by estimation, is 55 to 60%. The left ventricle has normal function. Suboptimal image quality limits for assessment of left ventricular hypertrophy. Left ventricular diastolic function could not be evaluated. Right Ventricle:  The right ventricular size is not well visualized. Right vetricular wall thickness was not assessed. Right ventricular systolic function was not well visualized. Left Atrium: Left atrial size was not assessed. Right Atrium: Right atrial size was not assessed. Pericardium: Presence of epicardial fat layer. Mitral Valve: The mitral valve was not well visualized. Tricuspid Valve: The tricuspid valve is not well visualized. Aortic Valve: The aortic valve was not well visualized. Pulmonic Valve: The pulmonic valve was not well visualized. Aorta: The aortic root was not well visualized and the aortic arch was not well visualized. IAS/Shunts: The interatrial septum was not assessed. Richard Tobb DO Electronically signed by Richard Huntsman DO Signature Date/Time: 04/09/2024/5:14:29 PM    Final    VAS US  CAROTID Result Date: 04/09/2024 Carotid Arterial Duplex Study Patient Name:  Richard Frederick  Date of Exam:   04/09/2024 Medical Rec #: [id number]         Accession #:    [id number] Date of Birth: [birth year]         Patient Gender: M Patient Age:   33 years Exam Location:  [facility] Procedure:      VAS US  CAROTID Referring Phys: Richard Frederick --------------------------------------------------------------------------------  Indications:  TIA. Risk Factors: Hypertension, Diabetes. Limitations   Today's exam was limited due to the patient's inability or               unwillingness to cooperate and unable to lie still. Performing Technologist: Richard Frederick, RVT  Examination Guidelines: A complete evaluation includes B-mode imaging, spectral Doppler, color Doppler, and power Doppler as needed of all accessible portions of each vessel. Bilateral testing is considered an integral part of a complete examination. Limited examinations for reoccurring indications may be performed as noted.  Right Carotid Findings: +----------+--------+--------+--------+--------------------------+--------+           PSV cm/sEDV  cm/sStenosisPlaque Description        Comments +----------+--------+--------+--------+--------------------------+--------+ CCA Prox  111                                                        +----------+--------+--------+--------+--------------------------+--------+ CCA Distal99      10              smooth and heterogenous            +----------+--------+--------+--------+--------------------------+--------+ ICA Prox  59      9       1-39%   irregular and heterogenous         +----------+--------+--------+--------+--------------------------+--------+ ICA  Mid   56      9                                                  +----------+--------+--------+--------+--------------------------+--------+ ICA Distal47      10                                                 +----------+--------+--------+--------+--------------------------+--------+ ECA       64                                                         +----------+--------+--------+--------+--------------------------+--------+ +----------+--------+-------+----------------+-------------------+           PSV cm/sEDV cmsDescribe        Arm Pressure (mmHG) +----------+--------+-------+----------------+-------------------+ [id number]            Multiphasic, WNL                    +----------+--------+-------+----------------+-------------------+ +---------+--------+--+--------+-+---------+ VertebralPSV cm/s37EDV cm/s8Antegrade +---------+--------+--+--------+-+---------+ High resistive waveforms seen throughout Left Carotid Findings: +----------+--------+--------+--------+--------------------------+--------+           PSV cm/sEDV cm/sStenosisPlaque Description        Comments +----------+--------+--------+--------+--------------------------+--------+ CCA Prox  85      9                                                   +----------+--------+--------+--------+--------------------------+--------+ CCA Distal69      9                                                  +----------+--------+--------+--------+--------------------------+--------+ ICA Prox  50      14      1-39%   irregular and heterogenous         +----------+--------+--------+--------+--------------------------+--------+ ICA Distal46      7                                                  +----------+--------+--------+--------+--------------------------+--------+ ECA       67                                                         +----------+--------+--------+--------+--------------------------+--------+ +----------+--------+--------+----------------+-------------------+           PSV cm/sEDV cm/sDescribe        Arm Pressure (mmHG) +----------+--------+--------+----------------+-------------------+ Subclavian101             Multiphasic, WNL                    +----------+--------+--------+----------------+-------------------+ +---------+--------+--+--------+-+---------+  VertebralPSV cm/s36EDV cm/s7Antegrade +---------+--------+--+--------+-+---------+ High resistive waveforms seen throughout  Summary: Right Carotid: Velocities in the right ICA are consistent with a 1-39% stenosis. Left Carotid: Velocities in the left ICA are consistent with a 1-39% stenosis. Vertebrals:  Bilateral vertebral arteries demonstrate antegrade flow. Subclavians: Normal flow hemodynamics were seen in bilateral subclavian              arteries. *See table(s) above for measurements and observations.  Electronically signed by Eather Popp MD on 04/09/2024 at 3:20:49 PM.    Final    DG Abd 1 View Result Date: 04/09/2024 EXAM: 1 VIEW XRAY OF THE ABDOMEN 04/09/2024 10:27:00 AM COMPARISON: None available. CLINICAL HISTORY: Constipation; Per progress notes: medical history of atrial fibrillation not on anticoagulation, dementia with severe sundowning  diabetes mellitus type 2 essential hypertension and dementia who was brought into the ED by his wife for confusion and an unwitnessed fall; overnight, the wife found him in the morning, wife relates that he does have dementia but his worst than his baseline. CT head and neck showed no acute findings, chest x-ray showed no acute processes. FINDINGS: BOWEL: A few dilated small bowel loops in the right lower quadrant. SOFT TISSUES: No opaque urinary calculi. BONES: No acute osseous abnormality. IMPRESSION: 1. Few dilated small bowel loops in the right lower quadrant, focal ileus versus partial small bowel obstruction. 2. Bibasilar opacities. Electronically signed by: Donnice Mania MD 04/09/2024 11:37 AM EDT RP Workstation: [id number]   DG Chest Port 1 View Result Date: 04/08/2024 EXAM: 1 VIEW XRAY OF THE CHEST 04/08/2024 07:27:00 PM COMPARISON: 01/13/2024 CLINICAL HISTORY: Altered mental status. Pt had a unwitnessed fall early this morning. Pt has altered mental status. Best obtainable chest xray due to patient not being able to follow commands. FINDINGS: LUNGS AND PLEURA: Low lung volumes. Bibasilar atelectasis. No focal pulmonary opacity. No pulmonary edema. No pleural effusion. No pneumothorax. HEART AND MEDIASTINUM: Atherosclerotic plaque. No acute abnormality of the cardiac and mediastinal silhouettes. BONES AND SOFT TISSUES: No acute osseous abnormality. IMPRESSION: 1. No acute process. 2. Low lung volumes and bibasilar atelectasis. Electronically signed by: Norman Gatlin MD 04/08/2024 07:37 PM EDT RP Workstation: [id number]   CT Cervical Spine Wo Contrast Result Date: 04/08/2024 EXAM: CT CERVICAL SPINE WITHOUT CONTRAST 04/08/2024 04:54:32 PM TECHNIQUE: CT of the cervical spine was performed without the administration of intravenous contrast. Multiplanar reformatted images are provided for review. Automated exposure control, iterative reconstruction, and/or weight based adjustment of the mA/kV was utilized  to reduce the radiation dose to as low as reasonably achievable. COMPARISON: None available. CLINICAL HISTORY: Neck trauma (Age >= 65y). 83 y/o male that fell around 0400 this morning. Wife reports slurred speech, delayed response, slower movements. Pt unaware of fall. VAN negative, no unilateral weakness noted. FINDINGS: CERVICAL SPINE: BONES AND ALIGNMENT: Cervical lordosis is maintained. 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 Trace anterolisthesis of C7 on T1. No facet dislocation. DEGENERATIVE CHANGES: Facet arthrosis and uncovertebral hypertrophy at multiple levels. No high-grade osseous spinal canal stenosis. SOFT TISSUES: No prevertebral soft tissue swelling. IMPRESSION: 1. No acute abnormality of the cervical spine related to the reported neck trauma. Electronically signed by: Donnice Mania MD 04/08/2024 05:12 PM EDT RP Workstation: [id number]   CT HEAD WO CONTRAST Result Date: 04/08/2024 EXAM: CT HEAD WITHOUT CONTRAST 04/08/2024 04:54:32 PM TECHNIQUE: CT of the head was performed without the administration of intravenous contrast. Automated exposure control, iterative reconstruction, and/or weight based adjustment of the mA/kV was utilized to reduce the radiation dose to as  low as reasonably achievable. COMPARISON: MRI head 06/23/2018 CLINICAL HISTORY: Mental status change, unknown cause. 83 y/o male that fell around 0400 this morning. Wife reports slurred speech, delayed response, slower movements. Pt unaware of fall. VAN negative, no unilateral weakness noted. FINDINGS: BRAIN AND VENTRICLES: No acute hemorrhage. No evidence of acute infarct. No hydrocephalus. No extra-axial collection. No mass effect or midline shift. Nonspecific hypoattenuation in the periventricular and subcortical white matter, most likely representing chronic microvascular ischemic changes. Mild generalized parenchymal volume loss. ORBITS: No acute abnormality. SINUSES: No acute abnormality. SOFT TISSUES AND SKULL: No acute soft tissue  abnormality. No skull fracture. Bright lens replacement. IMPRESSION: 1. No acute intracranial abnormality. 2. Mild for age chronic microvascular ischemic changes. 3. Mild generalized parenchymal volume loss. Electronically signed by: Donnice Mania MD 04/08/2024 05:04 PM EDT RP Workstation: [id number]     Medical Consultants:   None.   Subjective:    Richard Frederick awake today able to carry on a conversation.  Objective:    Vitals:   04/09/24 1544 04/09/24 1939 04/09/24 2329 04/10/24 0400  BP: 130/84 133/77 125/72 (!) 150/87  Pulse: 80 84 80 85  Resp: 19 17 18 18   Temp: (!) 97.5 F (36.4 C) 99.1 F (37.3 C) 98.2 F (36.8 C) 98.5 F (36.9 C)  TempSrc: Oral Oral Oral Oral  SpO2: 96% 93% 93% 95%  Weight:      Height:       SpO2: 95 %   Intake/Output Summary (Last 24 hours) at 04/10/2024 0625 Last data filed at 04/09/2024 1500 Gross per 24 hour  Intake --  Output 450 ml  Net -450 ml   Filed Weights   04/09/24 1500  Weight: 82.1 kg    Exam: General exam: In no acute distress. Respiratory system: Good air movement and clear to auscultation. Cardiovascular system: S1 & S2 heard, RRR. No JVD. Gastrointestinal system: Abdomen is nondistended, soft and nontender.  Central nervous system: Alert and oriented x 2 able to move all 4 extremities without any difficulties. Extremities: No pedal edema. Skin: No rashes, lesions or ulcers Psychiatry: As able to recognize his wife today.   Data Reviewed:    Labs: Basic Metabolic Panel: Recent Labs  Lab 04/08/24 1620 04/08/24 2223  NA 138  --   K 4.2  --   CL 100  --   CO2 25  --   GLUCOSE 115*  --   BUN 20  --   CREATININE 1.43*  --   CALCIUM 10.2  --   MG  --  2.0  PHOS  --  3.5   GFR Estimated Creatinine Clearance: 39.4 mL/min (A) (by C-G formula based on SCr of 1.43 mg/dL (H)). Liver Function Tests: Recent Labs  Lab 04/08/24 1620  AST 20  ALT 23  ALKPHOS 71  BILITOT 0.2  PROT 7.2  ALBUMIN 4.4    No results for input(s): LIPASE, AMYLASE in the last 168 hours. No results for input(s): AMMONIA in the last 168 hours. Coagulation profile Recent Labs  Lab 04/08/24 1620  INR 1.0   COVID-19 Labs  No results for input(s): DDIMER, FERRITIN, LDH, CRP in the last 72 hours.  Lab Results  Component Value Date   SARSCOV2NAA NEGATIVE 01/13/2024    CBC: Recent Labs  Lab 04/08/24 1620  WBC 10.2  NEUTROABS 7.0  HGB 15.6  HCT 48.1  MCV 88.1  PLT 229   Cardiac Enzymes: Recent Labs  Lab 04/08/24 1620  CKTOTAL 70  BNP (last 3 results) No results for input(s): PROBNP in the last 8760 hours. CBG: Recent Labs  Lab 04/09/24 1122 04/09/24 1546 04/09/24 1950 04/09/24 2337 04/10/24 0509  GLUCAP 169* 163* 124* 137* 122*   D-Dimer: No results for input(s): DDIMER in the last 72 hours. Hgb A1c: Recent Labs    04/08/24 2223  HGBA1C 5.4   Lipid Profile: Recent Labs    04/09/24 0000  CHOL 193  HDL 47  LDLCALC 131*  TRIG 76  CHOLHDL 4.1   Thyroid function studies: No results for input(s): TSH, T4TOTAL, T3FREE, THYROIDAB in the last 72 hours.  Invalid input(s): FREET3 Anemia work up: No results for input(s): VITAMINB12, FOLATE, FERRITIN, TIBC, IRON, RETICCTPCT in the last 72 hours. Sepsis Labs: Recent Labs  Lab 04/08/24 1620  WBC 10.2   Microbiology No results found for this or any previous visit (from the past 240 hours).   Medications:    clopidogrel   75 mg Oral Daily   donepezil   10 mg Oral Daily   enoxaparin  (LOVENOX ) injection  40 mg Subcutaneous QHS   ezetimibe   10 mg Oral Daily   lisinopril   20 mg Oral Daily   And   hydrochlorothiazide   12.5 mg Oral Daily   melatonin  3 mg Oral QHS   memantine   10 mg Oral BID   metoprolol  succinate  25 mg Oral Daily   sertraline   50 mg Oral Daily   tamsulosin   0.4 mg Oral Daily   Continuous Infusions:  sodium chloride         LOS: 1 day   Erle Odell Castor  [facility]  04/10/2024, 6:25 AM

## 2024-04-11 ENCOUNTER — Other Ambulatory Visit (HOSPITAL_COMMUNITY): Payer: Self-pay

## 2024-04-11 DIAGNOSIS — G9340 Encephalopathy, unspecified: Secondary | ICD-10-CM | POA: Diagnosis not present

## 2024-04-11 DIAGNOSIS — F03918 Unspecified dementia, unspecified severity, with other behavioral disturbance: Secondary | ICD-10-CM | POA: Diagnosis not present

## 2024-04-11 LAB — GLUCOSE, CAPILLARY
Glucose-Capillary: 97 mg/dL (ref 70–99)
Glucose-Capillary: 105 mg/dL — ABNORMAL HIGH (ref 70–99)
Glucose-Capillary: 140 mg/dL — ABNORMAL HIGH (ref 70–99)

## 2024-04-11 MED ORDER — MELATONIN 3 MG PO TABS
6.0000 mg | ORAL_TABLET | Freq: Every day | ORAL | 0 refills | Status: AC
  Filled 2024-04-11: qty 10, 5d supply, fill #0

## 2024-04-11 MED ORDER — QUETIAPINE FUMARATE 25 MG PO TABS: 25.0000 mg | ORAL_TABLET | Freq: Every day | ORAL | Status: DC

## 2024-04-11 MED ORDER — QUETIAPINE FUMARATE 25 MG PO TABS
25.0000 mg | ORAL_TABLET | Freq: Every day | ORAL | 0 refills | Status: AC
  Filled 2024-04-11: qty 5, 5d supply, fill #0

## 2024-04-11 MED ORDER — SODIUM CHLORIDE 0.9 % IV SOLN: INTRAVENOUS | Status: DC

## 2024-04-11 NOTE — Plan of Care (Signed)
  Problem: Education: Goal: Knowledge of General Education information will improve Description: Including pain rating scale, medication(s)/side effects and non-pharmacologic comfort measures Outcome: Progressing   Problem: Health Behavior/Discharge Planning: Goal: Ability to manage health-related needs will improve Outcome: Not Progressing   Problem: Clinical Measurements: Goal: Ability to maintain clinical measurements within normal limits will improve Outcome: Not Progressing

## 2024-04-11 NOTE — Discharge Summary (Signed)
 Physician Discharge Summary  Richard Frederick FMW:[id number] DOB: 10/11/1940 DOA: 04/08/2024  PCP: Dwight Trula SQUIBB, MD  Admit date: 04/08/2024 Discharge date: 04/11/2024  Admitted From: Home Disposition:  Home  Recommendations for Outpatient Follow-up:  Follow up with PCP in 1-2 weeks   Home Health:Yes Equipment/Devices:None  Discharge Condition:Stable CODE STATUS:DNR Diet recommendation: Heart Healthy    Brief/Interim Summary: 83 y.o. 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  CT head and neck showed no acute findings, chest x-ray showed no acute processes.    Discharge Diagnoses:  Principal Problem:   Encephalopathy acute Active Problems:   Essential hypertension   DM (diabetes mellitus), type 2 (HCC)   Dementia with behavioral disturbance (HCC)   Paroxysmal A-fib (HCC)  Acute metabolic encephalopathy superimposed on severe advanced dementia: A1c was 5.4, HDL 47 LDL 31. PT was consulted recommended home health PT. CT of the head was done that showed no acute abnormalities, he was moving all 4 extremities without any difficulties his mentation improved. The patient wife relates he probably cannot lay down for for an MRI of the brain to rule out a stroke we had a long conversation about this. She relates she would not want to put him to heavy sedation in order to get the MRI and since he is not focal we decided not to proceed with MRI. He was given Seroquel  and melatonin at night and he was able to rest tolerate his diet and eat.  Essential hypertension: No changes made to his medication.  Severe dementia with behavioral disturbances: No changes made made to his  medication. He was given melatonin which helped. He was given a short course of Seroquel  to take at home.  Diabetes mellitus type 2: With an A1c of 5.4 no insulin  required.  History of A-fib: Rate controlled, he has been deemed not a good candidate for anticoagulation due to frequent falls.  Discharge Instructions  Discharge Instructions     Diet - low sodium heart healthy   Complete by: As directed    Increase activity slowly   Complete by: As directed       Allergies as of 04/11/2024       Reactions   Amlodipine Other (See Comments)   Edema   Ativan  [lorazepam ] Other (See Comments)   Agitation, paradoxic reaction   Atorvastatin Other (See Comments)   Achy legs   Pravastatin Other (See Comments)   Achy legs   Rosuvastatin Other (See Comments)   Achy legs        Medication List     STOP taking these medications    triamcinolone 0.1 % paste Commonly known as: KENALOG       TAKE these medications    clopidogrel  75 MG tablet Commonly known as: PLAVIX  Take 1 tablet (75 mg total) by mouth daily. Please request future refills from PCP.   donepezil  10 MG tablet Commonly known as: ARICEPT  Take 10 mg by mouth daily.   ezetimibe  10 MG tablet Commonly known as: ZETIA  Take 10 mg by mouth daily.   lisinopril -hydrochlorothiazide  20-12.5 MG tablet Commonly known as: ZESTORETIC  Take 1 tablet by mouth daily.   melatonin 3 MG Tabs tablet Take 2 tablets (6 mg  total) by mouth at bedtime.   memantine  10 MG tablet Commonly known as: NAMENDA  Take 10 mg by mouth 2 (two) times daily.   metFORMIN  500 MG tablet Commonly known as: GLUCOPHAGE  Take by mouth daily with breakfast.   metoprolol  succinate 25 MG 24 hr tablet Commonly known as: TOPROL -XL Take 25 mg by mouth daily.   PreserVision AREDS 2 Caps Take 2 capsules by mouth daily.   sertraline  50 MG tablet Commonly known as: ZOLOFT  Take 50 mg by mouth daily.   tamsulosin  0.4 MG Caps capsule Commonly known  as: FLOMAX  Take 0.4 mg by mouth daily.   VITAMIN B-3 PO Take 1 tablet by mouth daily.        Follow-up Information     [facility] Follow up.   Specialty: Home Health Services Why: Registered Nurse, Physical and Occupational Therapy-office to call with visit times. Contact information: [address] [place] [address] [phone]                Allergies  Allergen Reactions   Amlodipine Other (See Comments)    Edema    Ativan  [Lorazepam ] Other (See Comments)    Agitation, paradoxic reaction   Atorvastatin Other (See Comments)    Achy legs   Pravastatin Other (See Comments)    Achy legs   Rosuvastatin Other (See Comments)    Achy legs    Consultations: None   Procedures/Studies: ECHOCARDIOGRAM COMPLETE Result Date: 04/09/2024    ECHOCARDIOGRAM REPORT   Patient Name:   BENNO BRENSINGER Date of Exam: 04/09/2024 Medical Rec #:  [id number]        Height:       67.0 in Accession #:    [id number]       Weight:       181.0 lb Date of Birth:  1940-08-14        BSA:          1.938 m Patient Age:    83 years         BP:           188/115 mmHg Patient Gender: M                HR:           100 bpm. Exam Location:  Inpatient Procedure: 2D Echo, Cardiac Doppler and Color Doppler (Both Spectral and Color            Flow Doppler were utilized during procedure). Indications:    TIA  History:        Patient has prior history of Echocardiogram examinations, most                 recent 07/18/2018. TIA, Arrythmias:Atrial Fibrillation; Risk                 Factors:Hypertension, Diabetes and Former Smoker.  Sonographer:    Juliene Rucks Referring Phys: [id number] ANASTASSIA DOUTOVA  Sonographer Comments: No parasternal window, no subcostal window and Technically difficult study due to poor echo windows. Image acquisition challenging due to patient behavioral factors. and Image acquisition challenging due to uncooperative patient. IMPRESSIONS  1. Left ventricular ejection  fraction, by estimation, is 55 to 60%. The left ventricle has normal function. Left ventricular diastolic function could not be evaluated.  2. Right ventricular systolic function was not well visualized. The right ventricular size is not well visualized.  3. The mitral valve was not well visualized.  4. The aortic valve  was not well visualized. FINDINGS  Left Ventricle: Left ventricular ejection fraction, by estimation, is 55 to 60%. The left ventricle has normal function. Suboptimal image quality limits for assessment of left ventricular hypertrophy. Left ventricular diastolic function could not be evaluated. Right Ventricle: The right ventricular size is not well visualized. Right vetricular wall thickness was not assessed. Right ventricular systolic function was not well visualized. Left Atrium: Left atrial size was not assessed. Right Atrium: Right atrial size was not assessed. Pericardium: Presence of epicardial fat layer. Mitral Valve: The mitral valve was not well visualized. Tricuspid Valve: The tricuspid valve is not well visualized. Aortic Valve: The aortic valve was not well visualized. Pulmonic Valve: The pulmonic valve was not well visualized. Aorta: The aortic root was not well visualized and the aortic arch was not well visualized. IAS/Shunts: The interatrial septum was not assessed. Kardie Tobb DO Electronically signed by Dub Huntsman DO Signature Date/Time: 04/09/2024/5:14:29 PM    Final    VAS US  CAROTID Result Date: 04/09/2024 Carotid Arterial Duplex Study Patient Name:  KEYSHON STEIN  Date of Exam:   04/09/2024 Medical Rec #: [id number]         Accession #:    [id number] Date of Birth: 1940-09-11         Patient Gender: M Patient Age:   27 years Exam Location:  [facility] Procedure:      VAS US  CAROTID Referring Phys: BLEASE DOUTOVA --------------------------------------------------------------------------------  Indications:  TIA. Risk Factors: Hypertension, Diabetes. Limitations    Today's exam was limited due to the patient's inability or               unwillingness to cooperate and unable to lie still. Performing Technologist: Elmarie Lindau, RVT  Examination Guidelines: A complete evaluation includes B-mode imaging, spectral Doppler, color Doppler, and power Doppler as needed of all accessible portions of each vessel. Bilateral testing is considered an integral part of a complete examination. Limited examinations for reoccurring indications may be performed as noted.  Right Carotid Findings: +----------+--------+--------+--------+--------------------------+--------+           PSV cm/sEDV cm/sStenosisPlaque Description        Comments +----------+--------+--------+--------+--------------------------+--------+ CCA Prox  111                                                        +----------+--------+--------+--------+--------------------------+--------+ CCA Distal99      10              smooth and heterogenous            +----------+--------+--------+--------+--------------------------+--------+ ICA Prox  59      9       1-39%   irregular and heterogenous         +----------+--------+--------+--------+--------------------------+--------+ ICA Mid   56      9                                                  +----------+--------+--------+--------+--------------------------+--------+ ICA Distal47      10                                                 +----------+--------+--------+--------+--------------------------+--------+  ECA       64                                                         +----------+--------+--------+--------+--------------------------+--------+ +----------+--------+-------+----------------+-------------------+           PSV cm/sEDV cmsDescribe        Arm Pressure (mmHG) +----------+--------+-------+----------------+-------------------+ [id number]            Multiphasic, WNL                     +----------+--------+-------+----------------+-------------------+ +---------+--------+--+--------+-+---------+ VertebralPSV cm/s37EDV cm/s8Antegrade +---------+--------+--+--------+-+---------+ High resistive waveforms seen throughout Left Carotid Findings: +----------+--------+--------+--------+--------------------------+--------+           PSV cm/sEDV cm/sStenosisPlaque Description        Comments +----------+--------+--------+--------+--------------------------+--------+ CCA Prox  85      9                                                  +----------+--------+--------+--------+--------------------------+--------+ CCA Distal69      9                                                  +----------+--------+--------+--------+--------------------------+--------+ ICA Prox  50      14      1-39%   irregular and heterogenous         +----------+--------+--------+--------+--------------------------+--------+ ICA Distal46      7                                                  +----------+--------+--------+--------+--------------------------+--------+ ECA       67                                                         +----------+--------+--------+--------+--------------------------+--------+ +----------+--------+--------+----------------+-------------------+           PSV cm/sEDV cm/sDescribe        Arm Pressure (mmHG) +----------+--------+--------+----------------+-------------------+ Subclavian101             Multiphasic, WNL                    +----------+--------+--------+----------------+-------------------+ +---------+--------+--+--------+-+---------+ VertebralPSV cm/s36EDV cm/s7Antegrade +---------+--------+--+--------+-+---------+ High resistive waveforms seen throughout  Summary: Right Carotid: Velocities in the right ICA are consistent with a 1-39% stenosis. Left Carotid: Velocities in the left ICA are consistent with a 1-39% stenosis.  Vertebrals:  Bilateral vertebral arteries demonstrate antegrade flow. Subclavians: Normal flow hemodynamics were seen in bilateral subclavian              arteries. *See table(s) above for measurements and observations.  Electronically signed by Eather Popp MD on 04/09/2024 at 3:20:49 PM.    Final    DG Abd 1 View Result Date: 04/09/2024 EXAM: 1 VIEW XRAY  OF THE ABDOMEN 04/09/2024 10:27:00 AM COMPARISON: None available. 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 CT head and neck showed no acute findings, chest x-ray showed no acute processes. FINDINGS: BOWEL: A few dilated small bowel loops in the right lower quadrant. SOFT TISSUES: No opaque urinary calculi. BONES: No acute osseous abnormality. IMPRESSION: 1. Few dilated small bowel loops in the right lower quadrant, focal ileus versus partial small bowel obstruction. 2. Bibasilar opacities. Electronically signed by: Donnice Mania MD 04/09/2024 11:37 AM EDT RP Workstation: [id number]   DG Chest Port 1 View Result Date: 04/08/2024 EXAM: 1 VIEW XRAY OF THE CHEST 04/08/2024 07:27:00 PM COMPARISON: 01/13/2024 CLINICAL HISTORY: Altered mental status. Pt had a unwitnessed fall early this morning. Pt has altered mental status. Best obtainable chest xray due to patient not being able to follow commands. FINDINGS: LUNGS AND PLEURA: Low lung volumes. Bibasilar atelectasis. No focal pulmonary opacity. No pulmonary edema. No pleural effusion. No pneumothorax. HEART AND MEDIASTINUM: Atherosclerotic plaque. No acute abnormality of the cardiac and mediastinal silhouettes. BONES AND SOFT TISSUES: No acute osseous  abnormality. IMPRESSION: 1. No acute process. 2. Low lung volumes and bibasilar atelectasis. Electronically signed by: Norman Gatlin MD 04/08/2024 07:37 PM EDT RP Workstation: [id number]   CT Cervical Spine Wo Contrast Result Date: 04/08/2024 EXAM: CT CERVICAL SPINE WITHOUT CONTRAST 04/08/2024 04:54:32 PM TECHNIQUE: CT of the cervical spine was performed without the administration of intravenous contrast. Multiplanar reformatted images are provided for review. Automated exposure control, iterative reconstruction, and/or weight based adjustment of the mA/kV was utilized to reduce the radiation dose to as low as reasonably achievable. COMPARISON: None available. CLINICAL HISTORY: Neck trauma (Age >= 65y). 83 y/o male that fell around 0400 this morning. Wife reports slurred speech, delayed response, slower movements. Pt unaware of fall. VAN negative, no unilateral weakness noted. FINDINGS: CERVICAL SPINE: BONES AND ALIGNMENT: Cervical lordosis is maintained. 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 Trace anterolisthesis of C7 on T1. No facet dislocation. DEGENERATIVE CHANGES: Facet arthrosis and uncovertebral hypertrophy at multiple levels. No high-grade osseous spinal canal stenosis. SOFT TISSUES: No prevertebral soft tissue swelling. IMPRESSION: 1. No acute abnormality of the cervical spine related to the reported neck trauma. Electronically signed by: Donnice Mania MD 04/08/2024 05:12 PM EDT RP Workstation: [id number]   CT HEAD WO CONTRAST Result Date: 04/08/2024 EXAM: CT HEAD WITHOUT CONTRAST 04/08/2024 04:54:32 PM TECHNIQUE: CT of the head was performed without the administration of intravenous contrast. Automated exposure control, iterative reconstruction, and/or weight based adjustment of the mA/kV was utilized to reduce the radiation dose to as low as reasonably achievable. COMPARISON: MRI head 06/23/2018 CLINICAL HISTORY: Mental status change, unknown cause. 83 y/o male that fell around 0400 this morning. Wife  reports slurred speech, delayed response, slower movements. Pt unaware of fall. VAN negative, no unilateral weakness noted. FINDINGS: BRAIN AND VENTRICLES: No acute hemorrhage. No evidence of acute infarct. No hydrocephalus. No extra-axial collection. No mass effect or midline shift. Nonspecific hypoattenuation in the periventricular and subcortical white matter, most likely representing chronic microvascular ischemic changes. Mild generalized parenchymal volume loss. ORBITS: No acute abnormality. SINUSES: No acute abnormality. SOFT TISSUES AND SKULL: No acute soft tissue abnormality. No skull  fracture. Bright lens replacement. IMPRESSION: 1. No acute intracranial abnormality. 2. Mild for age chronic microvascular ischemic changes. 3. Mild generalized parenchymal volume loss. Electronically signed by: Donnice Mania MD 04/08/2024 05:04 PM EDT RP Workstation: [id number]    Subjective: No complaints this morning  Discharge Exam: Vitals:   04/10/24 2305 04/11/24 0358  BP: (!) 173/94 (!) 188/107  Pulse: (!) 56 (!) 102  Resp: 18 18  Temp: (!) 97.5 F (36.4 C) (!) 97.5 F (36.4 C)  SpO2: 96% 95%   Vitals:   04/10/24 1517 04/10/24 1947 04/10/24 2305 04/11/24 0358  BP: 130/79 (!) 156/91 (!) 173/94 (!) 188/107  Pulse: 90 84 (!) 56 (!) 102  Resp: 15 18 18 18   Temp: 97.6 F (36.4 C) 98 F (36.7 C) (!) 97.5 F (36.4 C) (!) 97.5 F (36.4 C)  TempSrc: Oral  Oral   SpO2: 94% 95% 96% 95%  Weight:      Height:        General: Pt is alert, awake, not in acute distress Cardiovascular: RRR, S1/S2 +, no rubs, no gallops Respiratory: CTA bilaterally, no wheezing, no rhonchi Abdominal: Soft, NT, ND, bowel sounds + Extremities: no edema, no cyanosis    The results of significant diagnostics from this hospitalization (including imaging, microbiology, ancillary and laboratory) are listed below for reference.     Microbiology: No results found for this or any previous visit (from the past 240 hours).    Labs: BNP (last 3 results) Recent Labs    01/13/24 1039  BNP 32.2   Basic Metabolic Panel: Recent Labs  Lab 04/08/24 1620 04/08/24 2223  NA 138  --   K 4.2  --   CL 100  --   CO2 25  --   GLUCOSE 115*  --   BUN 20  --   CREATININE 1.43*  --   CALCIUM 10.2  --   MG  --  2.0  PHOS  --  3.5   Liver Function Tests: Recent Labs  Lab 04/08/24 1620  AST 20  ALT 23  ALKPHOS 71  BILITOT 0.2  PROT 7.2  ALBUMIN 4.4   No results for input(s): LIPASE, AMYLASE in the last 168 hours. No results for input(s): AMMONIA in the last 168 hours. CBC: Recent Labs  Lab 04/08/24 1620  WBC 10.2  NEUTROABS 7.0  HGB 15.6  HCT 48.1  MCV 88.1  PLT 229   Cardiac Enzymes: Recent Labs  Lab 04/08/24 1620  CKTOTAL 70   BNP: Invalid input(s): POCBNP CBG: Recent Labs  Lab 04/10/24 1224 04/10/24 1519 04/10/24 1948 04/10/24 2306 04/11/24 0359  GLUCAP 113* 159* 123* 99 97   D-Dimer No results for input(s): DDIMER in the last 72 hours. Hgb A1c Recent Labs    04/08/24 2223  HGBA1C 5.4   Lipid Profile Recent Labs    04/09/24 0000  CHOL 193  HDL 47  LDLCALC 131*  TRIG 76  CHOLHDL 4.1   Thyroid function studies No results for input(s): TSH, T4TOTAL, T3FREE, THYROIDAB in the last 72 hours.  Invalid input(s): FREET3 Anemia work up No results for input(s): VITAMINB12, FOLATE, FERRITIN, TIBC, IRON, RETICCTPCT in the last 72 hours. Urinalysis    Component Value Date/Time   COLORURINE YELLOW 04/08/2024 2226   APPEARANCEUR CLEAR 04/08/2024 2226   LABSPEC 1.011 04/08/2024 2226   PHURINE 5.0 04/08/2024 2226   GLUCOSEU NEGATIVE 04/08/2024 2226   HGBUR NEGATIVE 04/08/2024 2226   BILIRUBINUR NEGATIVE 04/08/2024 2226   KETONESUR NEGATIVE  04/08/2024 2226   PROTEINUR NEGATIVE 04/08/2024 2226   NITRITE NEGATIVE 04/08/2024 2226   LEUKOCYTESUR NEGATIVE 04/08/2024 2226   Sepsis Labs Recent Labs  Lab 04/08/24 1620  WBC 10.2    Microbiology No results found for this or any previous visit (from the past 240 hours).   Time coordinating discharge: Over 35 minutes  SIGNED:   Erle Odell Castor, MD  [facility] 04/11/2024, 7:28 AM Pager   If 7PM-7AM, please contact night-coverage [email or web address] Password [email or web address]

## 2024-04-11 NOTE — Plan of Care (Signed)
 Patient refusing telemetry.   DC telemetry and for now.  Once patient will allow it will restart the telemetry back again.

## 2024-04-11 NOTE — Progress Notes (Incomplete)
 [facility] PROGRESS NOTE    Progress Note  [name]  FMW:[id number] DOB: [birth year] DOA: 04/08/2024 PCP: Richard Trula SQUIBB, MD     Brief Narrative:   Richard Frederick is an 83 y.o. 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  CT head and neck showed no acute findings, chest x-ray showed no acute processes.    Assessment/Plan:   Acute metabolic encephalopathy: HgbA1c 5.4, fasting lipid panel HDL 47 LDL 131 Consult PT who recommended skilled nursing facility, as he needs a two-person assist. Will have PT evaluate him again today.  He passed a swallow evaluation. Transthoracic Echo 60% no valve abnormalities. Prior to admission he was on Plavix , can continue Plavix . Had serial oral intake yesterday. Will see how he does today.  Essential hypertension: Blood pressure is improved after restarting all of his antihypertensive medication. Continue to monitor.  Severe dementia with behavioral disturbances: He has a history of severe sundowning, try to avoid Ativan . No events overnight he did receive melatonin.  DM (diabetes mellitus), type 2 (HCC): Hold metformin  discontinue sliding scale insulin  his A1c is 5.4.  History of A-fib [facility]): Rate controlled, not a candidate for anticoagulation due to frequent falls.  He was not on anticoagulation prior to admission  DVT prophylaxis: lovenox  Family Communication:wife Status is: Observation The patient remains OBS appropriate and will d/c before 2 midnights.    Code Status:  Code Status History     Date Active Date Inactive Code Status Order ID Comments User Context   04/24/2016 1900 04/25/2016 1721 Full Code  [id number]  Verdene Purchase, MD Inpatient         IV Access:   Peripheral IV   Procedures and diagnostic studies:   ECHOCARDIOGRAM COMPLETE Result Date: 04/09/2024    ECHOCARDIOGRAM REPORT   Patient Name:   Richard Frederick Date of Exam: 04/09/2024 Medical Rec #:  [id number]        Height:       67.0 in Accession #:    [id number]       Weight:       181.0 lb Date of Birth:  Jan 12, 1941        BSA:          1.938 m Patient Age:    83 years         BP:           188/115 mmHg Patient Gender: M                HR:           100 bpm. Exam Location:  Inpatient Procedure: 2D Echo, Cardiac Doppler and Color Doppler (Both Spectral and Color            Flow Doppler were utilized during procedure). Indications:    TIA  History:        Patient has prior history of Echocardiogram examinations, most                 recent 07/18/2018. TIA, Arrythmias:Atrial Fibrillation; Risk                 Factors:Hypertension, Diabetes and Former Smoker.  Sonographer:    Richard Frederick  Referring Phys: [id number] ANASTASSIA DOUTOVA  Sonographer Comments: No parasternal window, no subcostal window and Technically difficult study due to poor echo windows. Image acquisition challenging due to patient behavioral factors. and Image acquisition challenging due to uncooperative patient. IMPRESSIONS  1. Left ventricular ejection fraction, by estimation, is 55 to 60%. The left ventricle has normal function. Left ventricular diastolic function could not be evaluated.  2. Right ventricular systolic function was not well visualized. The right ventricular size is not well visualized.  3. The mitral valve was not well visualized.  4. The aortic valve was not well visualized. FINDINGS  Left Ventricle: Left ventricular ejection fraction, by estimation, is 55 to 60%. The left ventricle has normal function. Suboptimal image quality limits for assessment of left ventricular hypertrophy. Left ventricular diastolic function could not be evaluated. Right Ventricle:  The right ventricular size is not well visualized. Right vetricular wall thickness was not assessed. Right ventricular systolic function was not well visualized. Left Atrium: Left atrial size was not assessed. Right Atrium: Right atrial size was not assessed. Pericardium: Presence of epicardial fat layer. Mitral Valve: The mitral valve was not well visualized. Tricuspid Valve: The tricuspid valve is not well visualized. Aortic Valve: The aortic valve was not well visualized. Pulmonic Valve: The pulmonic valve was not well visualized. Aorta: The aortic root was not well visualized and the aortic arch was not well visualized. IAS/Shunts: The interatrial septum was not assessed. Kardie Tobb DO Electronically signed by Dub Huntsman DO Signature Date/Time: 04/09/2024/5:14:29 PM    Final    VAS US  CAROTID Result Date: 04/09/2024 Carotid Arterial Duplex Study Patient Name:  Richard Frederick  Date of Exam:   04/09/2024 Medical Rec #: [id number]         Accession #:    [id number] Date of Birth: 1941-01-17         Patient Gender: M Patient Age:   24 years Exam Location:  [facility] Procedure:      VAS US  CAROTID Referring Phys: BLEASE DOUTOVA --------------------------------------------------------------------------------  Indications:  TIA. Risk Factors: Hypertension, Diabetes. Limitations   Today's exam was limited due to the patient's inability or               unwillingness to cooperate and unable to lie still. Performing Technologist: Elmarie Lindau, RVT  Examination Guidelines: A complete evaluation includes B-mode imaging, spectral Doppler, color Doppler, and power Doppler as needed of all accessible portions of each vessel. Bilateral testing is considered an integral part of a complete examination. Limited examinations for reoccurring indications may be performed as noted.  Right Carotid Findings: +----------+--------+--------+--------+--------------------------+--------+           PSV cm/sEDV  cm/sStenosisPlaque Description        Comments +----------+--------+--------+--------+--------------------------+--------+ CCA Prox  111                                                        +----------+--------+--------+--------+--------------------------+--------+ CCA Distal99      10              smooth and heterogenous            +----------+--------+--------+--------+--------------------------+--------+ ICA Prox  59      9       1-39%   irregular and heterogenous         +----------+--------+--------+--------+--------------------------+--------+ ICA  Mid   56      9                                                  +----------+--------+--------+--------+--------------------------+--------+ ICA Distal47      10                                                 +----------+--------+--------+--------+--------------------------+--------+ ECA       64                                                         +----------+--------+--------+--------+--------------------------+--------+ +----------+--------+-------+----------------+-------------------+           PSV cm/sEDV cmsDescribe        Arm Pressure (mmHG) +----------+--------+-------+----------------+-------------------+ [id number]            Multiphasic, WNL                    +----------+--------+-------+----------------+-------------------+ +---------+--------+--+--------+-+---------+ VertebralPSV cm/s37EDV cm/s8Antegrade +---------+--------+--+--------+-+---------+ High resistive waveforms seen throughout Left Carotid Findings: +----------+--------+--------+--------+--------------------------+--------+           PSV cm/sEDV cm/sStenosisPlaque Description        Comments +----------+--------+--------+--------+--------------------------+--------+ CCA Prox  85      9                                                   +----------+--------+--------+--------+--------------------------+--------+ CCA Distal69      9                                                  +----------+--------+--------+--------+--------------------------+--------+ ICA Prox  50      14      1-39%   irregular and heterogenous         +----------+--------+--------+--------+--------------------------+--------+ ICA Distal46      7                                                  +----------+--------+--------+--------+--------------------------+--------+ ECA       67                                                         +----------+--------+--------+--------+--------------------------+--------+ +----------+--------+--------+----------------+-------------------+           PSV cm/sEDV cm/sDescribe        Arm Pressure (mmHG) +----------+--------+--------+----------------+-------------------+ Subclavian101             Multiphasic, WNL                    +----------+--------+--------+----------------+-------------------+ +---------+--------+--+--------+-+---------+  VertebralPSV cm/s36EDV cm/s7Antegrade +---------+--------+--+--------+-+---------+ High resistive waveforms seen throughout  Summary: Right Carotid: Velocities in the right ICA are consistent with a 1-39% stenosis. Left Carotid: Velocities in the left ICA are consistent with a 1-39% stenosis. Vertebrals:  Bilateral vertebral arteries demonstrate antegrade flow. Subclavians: Normal flow hemodynamics were seen in bilateral subclavian              arteries. *See table(s) above for measurements and observations.  Electronically signed by Richard Popp MD on 04/09/2024 at 3:20:49 PM.    Final    DG Abd 1 View Result Date: 04/09/2024 EXAM: 1 VIEW XRAY OF THE ABDOMEN 04/09/2024 10:27:00 AM COMPARISON: None available. CLINICAL HISTORY: Constipation; Per progress notes: medical history of atrial fibrillation not on anticoagulation, dementia with severe sundowning  diabetes mellitus type 2 essential hypertension and dementia who was brought into the ED by his wife for confusion and an unwitnessed fall; overnight, the wife found him in the morning, wife relates that he does have dementia but his worst than his baseline. CT head and neck showed no acute findings, chest x-ray showed no acute processes. FINDINGS: BOWEL: A few dilated small bowel loops in the right lower quadrant. SOFT TISSUES: No opaque urinary calculi. BONES: No acute osseous abnormality. IMPRESSION: 1. Few dilated small bowel loops in the right lower quadrant, focal ileus versus partial small bowel obstruction. 2. Bibasilar opacities. Electronically signed by: Donnice Mania MD 04/09/2024 11:37 AM EDT RP Workstation: [id number]     Medical Consultants:   None.   Subjective:    Richard Frederick awake today able to carry on a conversation.  Objective:    Vitals:   04/10/24 1517 04/10/24 1947 04/10/24 2305 04/11/24 0358  BP: 130/79 (!) 156/91 (!) 173/94 (!) 188/107  Pulse: 90 84 (!) 56 (!) 102  Resp: 15 18 18 18   Temp: 97.6 F (36.4 C) 98 F (36.7 C) (!) 97.5 F (36.4 C) (!) 97.5 F (36.4 C)  TempSrc: Oral  Oral   SpO2: 94% 95% 96% 95%  Weight:      Height:       SpO2: 95 %   Intake/Output Summary (Last 24 hours) at 04/11/2024 0713 Last data filed at 04/10/2024 1700 Gross per 24 hour  Intake 748.33 ml  Output 500 ml  Net 248.33 ml   Filed Weights   04/09/24 1500  Weight: 82.1 kg    Exam: General exam: In no acute distress. Respiratory system: Good air movement and clear to auscultation. Cardiovascular system: S1 & S2 heard, RRR. No JVD. Gastrointestinal system: Abdomen is nondistended, soft and nontender.  Central nervous system: Alert and oriented x 2 able to move all 4 extremities without any difficulties. Extremities: No pedal edema. Skin: No rashes, lesions or ulcers Psychiatry: As able to recognize his wife today.   Data Reviewed:    Labs: Basic  Metabolic Panel: Recent Labs  Lab 04/08/24 1620 04/08/24 2223  NA 138  --   K 4.2  --   CL 100  --   CO2 25  --   GLUCOSE 115*  --   BUN 20  --   CREATININE 1.43*  --   CALCIUM 10.2  --   MG  --  2.0  PHOS  --  3.5   GFR Estimated Creatinine Clearance: 39.4 mL/min (A) (by C-G formula based on SCr of 1.43 mg/dL (H)). Liver Function Tests: Recent Labs  Lab 04/08/24 1620  AST 20  ALT 23  ALKPHOS 71  BILITOT 0.2  PROT 7.2  ALBUMIN 4.4   No results for input(s): LIPASE, AMYLASE in the last 168 hours. No results for input(s): AMMONIA in the last 168 hours. Coagulation profile Recent Labs  Lab 04/08/24 1620  INR 1.0   COVID-19 Labs  No results for input(s): DDIMER, FERRITIN, LDH, CRP in the last 72 hours.  Lab Results  Component Value Date   SARSCOV2NAA NEGATIVE 01/13/2024    CBC: Recent Labs  Lab 04/08/24 1620  WBC 10.2  NEUTROABS 7.0  HGB 15.6  HCT 48.1  MCV 88.1  PLT 229   Cardiac Enzymes: Recent Labs  Lab 04/08/24 1620  CKTOTAL 70   BNP (last 3 results) No results for input(s): PROBNP in the last 8760 hours. CBG: Recent Labs  Lab 04/10/24 1224 04/10/24 1519 04/10/24 1948 04/10/24 2306 04/11/24 0359  GLUCAP 113* 159* 123* 99 97   D-Dimer: No results for input(s): DDIMER in the last 72 hours. Hgb A1c: Recent Labs    04/08/24 2223  HGBA1C 5.4   Lipid Profile: Recent Labs    04/09/24 0000  CHOL 193  HDL 47  LDLCALC 131*  TRIG 76  CHOLHDL 4.1   Thyroid function studies: No results for input(s): TSH, T4TOTAL, T3FREE, THYROIDAB in the last 72 hours.  Invalid input(s): FREET3 Anemia work up: No results for input(s): VITAMINB12, FOLATE, FERRITIN, TIBC, IRON, RETICCTPCT in the last 72 hours. Sepsis Labs: Recent Labs  Lab 04/08/24 1620  WBC 10.2   Microbiology No results found for this or any previous visit (from the past 240 hours).   Medications:    clopidogrel   75 mg Oral Daily    donepezil   10 mg Oral Daily   enoxaparin  (LOVENOX ) injection  40 mg Subcutaneous QHS   ezetimibe   10 mg Oral Daily   lisinopril   20 mg Oral Daily   And   hydrochlorothiazide   12.5 mg Oral Daily   melatonin  3 mg Oral QHS   memantine   10 mg Oral BID   metoprolol  succinate  25 mg Oral Daily   sertraline   50 mg Oral Daily   tamsulosin   0.4 mg Oral Daily   Continuous Infusions:      LOS: 2 days   Erle Odell Castor  [facility]  04/11/2024, 7:13 AM

## 2024-04-11 NOTE — TOC CM/SW Note (Signed)
 Referral received to assist pt with a hospital bed. Met with pt and wife. Wife agrees with the hospital bed. She reports that she doesn't have a preference for a DME agency. Will contact [employer] for the referral and she agreed. Contacted Jermaine at [employer] and he accepted the referral. Wife reports that the CM arranged HH with [facility], but she gave her the wrong information. She reports that she has used [facility] in the past. Will call [facility] for the referral and she agreed. Contacted Glenda at [facility] x 2 but she did not return my call. Wife agrees to use [facility]. Notified Katina at [employer] that pt has been DC.

## 2024-04-12 ENCOUNTER — Emergency Department (HOSPITAL_COMMUNITY)
Admission: EM | Admit: 2024-04-12 | Discharge: 2024-04-15 | Disposition: A | Discharge status: Home / Self Care | Attending: Emergency Medicine | Admitting: Emergency Medicine

## 2024-04-12 ENCOUNTER — Encounter (HOSPITAL_COMMUNITY): Payer: Self-pay

## 2024-04-12 DIAGNOSIS — Z9181 History of falling: Secondary | ICD-10-CM | POA: Diagnosis not present

## 2024-04-12 DIAGNOSIS — E119 Type 2 diabetes mellitus without complications: Secondary | ICD-10-CM | POA: Diagnosis not present

## 2024-04-12 DIAGNOSIS — D72829 Elevated white blood cell count, unspecified: Secondary | ICD-10-CM | POA: Diagnosis not present

## 2024-04-12 DIAGNOSIS — Z7984 Long term (current) use of oral hypoglycemic drugs: Secondary | ICD-10-CM | POA: Insufficient documentation

## 2024-04-12 DIAGNOSIS — Z7902 Long term (current) use of antithrombotics/antiplatelets: Secondary | ICD-10-CM | POA: Insufficient documentation

## 2024-04-12 DIAGNOSIS — R197 Diarrhea, unspecified: Secondary | ICD-10-CM | POA: Diagnosis not present

## 2024-04-12 DIAGNOSIS — I10 Essential (primary) hypertension: Secondary | ICD-10-CM | POA: Insufficient documentation

## 2024-04-12 DIAGNOSIS — F0390 Unspecified dementia without behavioral disturbance: Secondary | ICD-10-CM | POA: Insufficient documentation

## 2024-04-12 LAB — BASIC METABOLIC PANEL WITH GFR
Anion gap: 13 (ref 5–15)
BUN: 19 mg/dL (ref 8–23)
CO2: 24 mmol/L (ref 22–32)
Calcium: 10.2 mg/dL (ref 8.9–10.3)
Chloride: 100 mmol/L (ref 98–111)
Creatinine, Ser: 1.19 mg/dL (ref 0.61–1.24)
GFR, Estimated: >60 mL/min (ref >60)
Glucose, Bld: 100 mg/dL — ABNORMAL HIGH (ref 70–99)
Potassium: 4.1 mmol/L (ref 3.5–5.1)
Sodium: 138 mmol/L (ref 135–145)

## 2024-04-12 LAB — CBC
HCT: 51.9 % (ref 39.0–52.0)
Hemoglobin: 16.2 g/dL (ref 13.0–17.0)
MCH: 27.4 pg (ref 26.0–34.0)
MCHC: 31.2 g/dL (ref 30.0–36.0)
MCV: 87.8 fL (ref 80.0–100.0)
Platelets: 243 K/uL (ref 150–400)
RBC: 5.91 MIL/uL — ABNORMAL HIGH (ref 4.22–5.81)
RDW: 13.3 % (ref 11.5–15.5)
WBC: 13 K/uL — ABNORMAL HIGH (ref 4.0–10.5)
nRBC: 0 % (ref 0.0–0.2)

## 2024-04-12 LAB — CBG MONITORING, ED: Glucose-Capillary: 82 mg/dL (ref 70–99)

## 2024-04-12 MED ORDER — NIACIN 100 MG PO TABS
100.0000 mg | ORAL_TABLET | Freq: Every day | ORAL | Status: DC
  Administered 2024-04-14 – 2024-04-15 (×2): 100 mg via ORAL
  Filled 2024-04-12 (×3): qty 1

## 2024-04-12 MED ORDER — PROSIGHT PO TABS
1.0000 | ORAL_TABLET | Freq: Every day | ORAL | Status: DC
Start: 2024-04-13 — End: 2024-04-15
  Administered 2024-04-14 – 2024-04-15 (×2): 1 via ORAL
  Filled 2024-04-12 (×3): qty 1

## 2024-04-12 MED ORDER — TAMSULOSIN HCL 0.4 MG PO CAPS
0.4000 mg | ORAL_CAPSULE | Freq: Every day | ORAL | Status: DC
  Administered 2024-04-13 – 2024-04-15 (×3): 0.4 mg via ORAL
  Filled 2024-04-12 (×3): qty 1

## 2024-04-12 MED ORDER — METFORMIN HCL 500 MG PO TABS
500.0000 mg | ORAL_TABLET | Freq: Every day | ORAL | Status: DC
  Administered 2024-04-13 – 2024-04-15 (×3): 500 mg via ORAL
  Filled 2024-04-12 (×3): qty 1

## 2024-04-12 MED ORDER — METOPROLOL SUCCINATE ER 50 MG PO TB24
25.0000 mg | ORAL_TABLET | Freq: Every day | ORAL | Status: DC
  Administered 2024-04-13 – 2024-04-15 (×3): 25 mg via ORAL
  Filled 2024-04-12 (×3): qty 1

## 2024-04-12 MED ORDER — EZETIMIBE 10 MG PO TABS
10.0000 mg | ORAL_TABLET | Freq: Every day | ORAL | Status: DC
  Administered 2024-04-13 – 2024-04-15 (×3): 10 mg via ORAL
  Filled 2024-04-12 (×3): qty 1

## 2024-04-12 MED ORDER — SERTRALINE HCL 50 MG PO TABS
50.0000 mg | ORAL_TABLET | Freq: Every day | ORAL | Status: DC
  Administered 2024-04-13 – 2024-04-15 (×3): 50 mg via ORAL
  Filled 2024-04-12 (×3): qty 1

## 2024-04-12 MED ORDER — MEMANTINE HCL 5 MG PO TABS
10.0000 mg | ORAL_TABLET | Freq: Two times a day (BID) | ORAL | Status: DC
  Administered 2024-04-12 – 2024-04-15 (×5): 10 mg via ORAL
  Filled 2024-04-12 (×6): qty 2

## 2024-04-12 MED ORDER — MELATONIN 3 MG PO TABS
6.0000 mg | ORAL_TABLET | Freq: Every day | ORAL | Status: DC
  Administered 2024-04-12 – 2024-04-13 (×2): 6 mg via ORAL
  Filled 2024-04-12 (×3): qty 2

## 2024-04-12 MED ORDER — DONEPEZIL HCL 5 MG PO TABS
10.0000 mg | ORAL_TABLET | Freq: Every day | ORAL | Status: DC
  Administered 2024-04-13 – 2024-04-15 (×3): 10 mg via ORAL
  Filled 2024-04-12 (×3): qty 2

## 2024-04-12 MED ORDER — QUETIAPINE FUMARATE 25 MG PO TABS
25.0000 mg | ORAL_TABLET | Freq: Every day | ORAL | Status: DC
  Administered 2024-04-12 – 2024-04-13 (×2): 25 mg via ORAL
  Filled 2024-04-12 (×3): qty 1

## 2024-04-12 NOTE — Progress Notes (Signed)
 Awaiting PT eval.

## 2024-04-12 NOTE — ED Provider Notes (Signed)
  EMERGENCY DEPARTMENT AT [facility] Provider Note   CSN: [id number] Arrival date & time: 04/12/24  1035     Patient presents with: Social Work   Richard Frederick is a 83 y.o. male with history of dementia, hypertension, hyperlipidemia, type 2 diabetes, paroxysmal A-fib not on blood thinner presents to the emergency department today for evaluation of SNF placement.  Patient was recently discharged yesterday for acute encephalopathy and PT home health was recommended.  The wife thought that she was able to take care of him at home however quickly discovered that she could not and brought him back to the emerged [facility] for SNF placement.  Patient currently oriented to self only.  Has no complaints but is asking for chewing tobacco.  After speaking with wife,she reports that he was up all night with him. He did have a few episodes of loose stool, but no complaints of abdominal pain. She would like for him to go to [facility]. He hasn't walked since his admission but did previously walk with a walker. Isn't able to lift him at home. No falls.   HPI     Prior to Admission medications   Medication Sig Start Date End Date Taking? Authorizing Provider  clopidogrel  (PLAVIX ) 75 MG tablet Take 1 tablet (75 mg total) by mouth daily. Please request future refills from PCP. Patient not taking: Reported on 04/09/2024 06/29/19   Onita Duos, MD  donepezil  (ARICEPT ) 10 MG tablet Take 10 mg by mouth daily. 04/06/18   [provider]  ezetimibe  (ZETIA ) 10 MG tablet Take 10 mg by mouth daily. 06/18/18   [provider]  lisinopril -hydrochlorothiazide  (PRINZIDE ,ZESTORETIC ) 20-12.5 MG tablet Take 1 tablet by mouth daily. Patient not taking: Reported on 04/09/2024 04/29/18   [provider]  melatonin 3 MG TABS tablet Take 2 tablets (6 mg total) by mouth at bedtime. 04/11/24   Odell Celinda Balo, MD  memantine  (NAMENDA ) 10 MG tablet Take 10 mg by mouth 2 (two)  times daily. 06/25/18   [provider]  metFORMIN  (GLUCOPHAGE ) 500 MG tablet Take by mouth daily with breakfast.    [provider]  metoprolol  succinate (TOPROL -XL) 25 MG 24 hr tablet Take 25 mg by mouth daily.    [provider]  Multiple Vitamins-Minerals (PRESERVISION AREDS 2) CAPS Take 2 capsules by mouth daily. 11/26/18   [provider]  Niacin  (VITAMIN B-3 PO) Take 1 tablet by mouth daily.    [provider]  QUEtiapine  (SEROQUEL ) 25 MG tablet Take 1 tablet (25 mg total) by mouth at bedtime. 04/11/24   Odell Celinda Balo, MD  sertraline  (ZOLOFT ) 50 MG tablet Take 50 mg by mouth daily. 06/25/18   [provider]  tamsulosin  (FLOMAX ) 0.4 MG CAPS capsule Take 0.4 mg by mouth daily. 04/29/18   [provider]    Allergies: Amlodipine, Ativan  [lorazepam ], Atorvastatin, Pravastatin, and Rosuvastatin    Review of Systems  Unable to perform ROS: Dementia    Updated Vital Signs BP (!) 163/94 (BP Location: Right Arm)   Pulse 93   Temp 97.8 F (36.6 C) (Oral)   Resp 18   Ht 5' 7 (1.702 m)   Wt 81.6 kg   SpO2 96%   BMI 28.19 kg/m   Physical Exam Vitals and nursing note reviewed.  Constitutional:      Appearance: He is not toxic-appearing.     Comments: Pleasant  Eyes:     General: No scleral icterus. Cardiovascular:  Rate and Rhythm: Normal rate.  Pulmonary:     Effort: Pulmonary effort is normal. No respiratory distress.  Skin:    General: Skin is warm and dry.  Neurological:     Mental Status: He is alert.     Comments: Patient is oriented to self only, he thinks he is at [facility] and that it is January 1983     (all labs ordered are listed, but only abnormal results are displayed) Labs Reviewed  BASIC METABOLIC PANEL WITH GFR  CBC    EKG: None  Radiology: No results found.  Procedures   Medications Ordered in the ED - No data to display  Medical Decision Making Amount and/or Complexity of  Data Reviewed Labs: ordered.  Risk OTC drugs. Prescription drug management.   83 y.o. male presents to the ER for evaluation of needing placement.  Vital signs mildly elevated BP otherwise unremarkable. Physical exam as noted above.   On previous chart evaluation, the patient was recently admitted and discharged for eacute encephalopthy. PT evaluated the patient and home health PT was recommended. His wife does not feel comfortable taking care of him at home. Will obtain basic labs and order his home medications to make him boarding status. Home medications re-ordered .  I have ordered for PT evaluation.  I did initially order stool studies given his recent admission as well as his wife mention he had a few loose stools however patient is been here for 8-1/2 hours and has not had any loose bowel movement.  May have had some looser stools from his bowel regimen he was placed on while inpatient.  I have a lower suspicion for any C. difficile now.  I independently reviewed and interpreted the patient's labs.  CBC shows mildly elevated white blood count at 13.0 otherwise no anemia.  BMP showed glucose of 100 otherwise no other electrolyte abnormality.  I have ordered POC CBGs to be checked at meals and as needed.  Portions of this report may have been transcribed using voice recognition software. Every effort was made to ensure accuracy; however, inadvertent computerized transcription errors may be present.    Final diagnoses:  Dementia, unspecified dementia severity, unspecified dementia type, unspecified whether behavioral, psychotic, or mood disturbance or anxiety [facility])    ED Discharge Orders     None          Bernis Ernst, PA-C 04/12/24 1915    Francesca Elsie CROME, MD 04/13/24 [phone]

## 2024-04-12 NOTE — ED Triage Notes (Signed)
 Pt BIB ems from home. Pt recently discharged from hospital to home with wife. Wife reports to EMS that she was in denial with his declining physical health when she agreed to take patient home to care for him. She now realizes that she can not manage his care at home and he needs SNF placement.

## 2024-04-12 NOTE — ED Notes (Addendum)
 Pt resting in bed with eyes closed. Breathing even and non labored.

## 2024-04-12 NOTE — Evaluation (Signed)
 Physical Therapy Evaluation Patient Details Name: Richard Frederick MRN: [id number] DOB: [birth year] Today's Date: 04/12/2024  History of Present Illness  83 y.o. male presents to ED  for  SNF placement. Patient was recently discharged from ED for acute encephalopathy with home health PT recommended. The wife thought that she was able to take care of him at home however brought him back for  SNF placement.  PMH: dementia, hypertension, hyperlipidemia, type 2 diabetes, paroxysmal A-fib  Clinical Impression  Pt admitted with above diagnosis.  Pt seen in ED hallway, no family present. Pt is confused, pleasant and cooperative. Pt is found with hands, shirt and bed covered/caked  in dried stool, mutiple layers of sheets/bedding and more stool on each layer. Pt appeared to be wearing briefs from home which were full of urine/stool. Pt states he has tar' on his hands. Assisted pt with hygiene and into clean gown/bedding--nursing staff updated. Pt is min assist with functional mobility tasks and is coopertive throughout session(it appears pt level of assist needed fluctuates per previous ED visit). The plan is for SNF per MD notes.  Pt may benefit from  continued inpatient follow up therapy, <3 hours/day at d/c vs HHPT if he progresses to his baseline during acute stay. Pt ultimately needs Memory care/ALF setting.   Pt currently with functional limitations due to the deficits listed below (see PT Problem List). Pt will benefit from acute skilled PT to increase their independence and safety with mobility to allow discharge.           If plan is discharge home, recommend the following: A little help with walking and/or transfers;A little help with bathing/dressing/bathroom;Direct supervision/assist for medications management;Assist for transportation;Assistance with cooking/housework;Help with stairs or ramp for entrance;Supervision due to cognitive status   Can travel by private vehicle   No     Equipment Recommendations Other (comment) (TBD)  Recommendations for Other Services       Functional Status Assessment Patient has had a recent decline in their functional status and demonstrates the ability to make significant improvements in function in a reasonable and predictable amount of time.     Precautions / Restrictions Precautions Precautions: Fall Recall of Precautions/Restrictions: Impaired Restrictions Weight Bearing Restrictions Per Provider Order: No      Mobility  Bed Mobility Overal bed mobility: Needs Assistance Bed Mobility: Rolling, Sidelying to Sit Rolling: Min assist Sidelying to sit: Min assist, Mod assist Supine to sit: Used rails     General bed mobility comments: able to bring LE's off EOB with multi-modal cues, min to ModA to raise trunk    Transfers   Equipment used: Rolling walker (2 wheels) Transfers: Sit to/from Stand Sit to Stand: Min assist, +2 safety/equipment           General transfer comment: light assist to come to stand and transition to RW; pt tol standing for removal of briefs that were soaked in urine and stool and for  assist with hygiene.    Ambulation/Gait Ambulation/Gait assistance: Min assist   Assistive device: Rolling walker (2 wheels)         General Gait Details: min assist  to take lateral steps alogn EOB. pt declined to amb further  Stairs            Wheelchair Mobility     Tilt Bed    Modified Rankin (Stroke Patients Only)       Balance Overall balance assessment: Mild deficits observed, not formally tested  Pertinent Vitals/Pain Pain Assessment Pain Assessment: No/denies pain    Home Living Family/patient expects to be discharged to:: Private residence Living Arrangements: Spouse/significant other Available Help at Discharge: Family;Available 24 hours/day Type of Home: House Home Access: Stairs to enter   Entrance  Stairs-Number of Steps: 1-small step   Home Layout: One level Home Equipment: Shower seat;Grab bars - tub/shower;Rolling Walker (2 wheels);Cane - single point;Rollator (4 wheels);BSC/3in1;Tub bench;Wheelchair - manual      Prior Function Prior Level of Function : History of Falls (last six months);Patient poor historian/Family not available             Mobility Comments: Occasionally needs assist for bed mobility, ambulates with rollator; info  taken from prior admission as pt is unable to provide ADLs Comments: Wife provides supervision for bathing and occasionally assists with dressing. Handles finances and medications     Extremity/Trunk Assessment   Upper Extremity Assessment Upper Extremity Assessment: Defer to OT evaluation;Overall [facility] for tasks assessed    Lower Extremity Assessment Lower Extremity Assessment: Generalized weakness    Cervical / Trunk Assessment Cervical / Trunk Assessment: Normal  Communication   Communication Communication: No apparent difficulties    Cognition Arousal: Alert Behavior During Therapy: WFL for tasks assessed/performed   PT - Cognitive impairments: History of cognitive impairments                       PT - Cognition Comments: alert, cooperative with PT. unaware that he was covered in feces--pt states it is tar Following commands: Impaired Following commands impaired: Only follows one step commands consistently, Follows multi-step commands inconsistently     Cueing Cueing Techniques: Verbal cues, Gestural cues, Tactile cues     General Comments General comments (skin integrity, edema, etc.): no family present at time of PT eval    Exercises     Assessment/Plan    PT Assessment Patient needs continued PT services  PT Problem List Decreased strength;Decreased activity tolerance;Decreased balance;Decreased mobility;Decreased cognition;Decreased knowledge of use of DME;Decreased safety awareness       PT  Treatment Interventions DME instruction;Gait training;Stair training;Functional mobility training;Therapeutic activities;Therapeutic exercise;Balance training;Patient/family education    PT Goals (Current goals can be found in the Care Plan section)  Acute Rehab PT Goals Patient Stated Goal: none stated/unable PT Goal Formulation: Patient unable to participate in goal setting Time For Goal Achievement: 04/26/24 Potential to Achieve Goals: Fair    Frequency Min 2X/week     Co-evaluation               AM-PAC PT 6 Clicks Mobility  Outcome Measure Help needed turning from your back to your side while in a flat bed without using bedrails?: A Little Help needed moving from lying on your back to sitting on the side of a flat bed without using bedrails?: A Little Help needed moving to and from a bed to a chair (including a wheelchair)?: A Little Help needed standing up from a chair using your arms (e.g., wheelchair or bedside chair)?: A Little Help needed to walk in hospital room?: A Little Help needed climbing 3-5 steps with a railing? : A Lot 6 Click Score: 17    End of Session   Activity Tolerance: Patient tolerated treatment well Patient left: in bed;with bed alarm set Nurse Communication: Mobility status (pt soiled with feces--PT assisted pt with pericare as able and   and changed gown (shirt was covered in stool)) PT Visit Diagnosis: Unsteadiness on feet (R26.81);Other  abnormalities of gait and mobility (R26.89);Muscle weakness (generalized) (M62.81);History of falling (Z91.81)    Time: 8592-8573 PT Time Calculation (min) (ACUTE ONLY): 19 min   Charges:   PT Evaluation $PT Eval Low Complexity: 1 Low   PT General Charges $$ ACUTE PT VISIT: 1 Visit         Nichol Ator, PT  Acute Rehab Dept [facility]) [phone]  04/12/2024   [facility] 04/12/2024, 4:38 PM

## 2024-04-13 ENCOUNTER — Other Ambulatory Visit: Payer: Self-pay

## 2024-04-13 LAB — CBG MONITORING, ED
Glucose-Capillary: 108 mg/dL — ABNORMAL HIGH (ref 70–99)
Glucose-Capillary: 104 mg/dL — ABNORMAL HIGH (ref 70–99)
Glucose-Capillary: 165 mg/dL — ABNORMAL HIGH (ref 70–99)
Glucose-Capillary: 124 mg/dL — ABNORMAL HIGH (ref 70–99)

## 2024-04-13 NOTE — Progress Notes (Signed)
 CSW received call from [facility] requesting clinical documentation be faxed for review at pt wife request. H&P, Facesheet, and FL2 faxed to number [phone].

## 2024-04-13 NOTE — ED Notes (Signed)
 Patient ate a small portion of his applesauce and oatmeal    he had to be fed

## 2024-04-13 NOTE — ED Notes (Signed)
 Patient is resting    he refuses to be covered up   blanket was placed several times and he would push it down

## 2024-04-13 NOTE — TOC Progression Note (Signed)
 Transition of Care [facility]) - Progression Note    Patient Details  Name: Richard Frederick MRN: [id number] Date of Birth: 12/06/1940  Transition of Care [facility]) CM/SW Contact  Luann SHAUNNA [place], [place] Phone Number: 04/13/2024, 3:23 PM  Clinical Narrative:     CSW contacted Tammy with [facility]; awaiting response if they can accept.   CSW called pt's spouse and left voicemail requesting return call.          Social Drivers of Health (SDOH) Interventions SDOH Screenings   Food Insecurity: No Food Insecurity (04/09/2024)  Housing: Low Risk  (04/09/2024)  Transportation Needs: No Transportation Needs (04/09/2024)  Utilities: Not At Risk (04/09/2024)  Social Connections: Socially Integrated (04/09/2024)  Tobacco Use: Medium Risk (04/12/2024)    Readmission Risk Interventions     No data to display

## 2024-04-13 NOTE — NC FL2 (Signed)
 [place]  MEDICAID FL2 LEVEL OF CARE FORM     IDENTIFICATION  Patient Name: Richard Frederick Birthdate: [birth year] Sex: male Admission Date (Current Location): 04/12/2024  [facility] and [place] Number:  Producer, television/film/video and Address:  [facility],  [address] [place], [place] [address]      Provider Number: [id number]  Attending Physician Name and Address:  Carita Senior, MD  Relative Name and Phone Number:  Kareen, Hitsman (Spouse)  [phone] [facility])    Current Level of Care: Hospital Recommended Level of Care: Skilled Nursing Facility Prior Approval Number:    Date Approved/Denied:   PASRR Number: [id number]  Discharge Plan: SNF    Current Diagnoses: Patient Active Problem List   Diagnosis Date Noted   Encephalopathy acute 04/09/2024   Dementia with behavioral disturbance (HCC) 04/08/2024   Paroxysmal A-fib (HCC) 04/08/2024   Memory loss 07/04/2018   VI nerve palsy, 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   Vertigo 04/24/2016   Essential hypertension 04/24/2016   DM (diabetes mellitus), type 2 (HCC) 04/24/2016    Orientation RESPIRATION BLADDER Height & Weight     Self  Normal Incontinent Weight: 180 lb (81.6 kg) Height:  5' 7 (170.2 cm)  BEHAVIORAL SYMPTOMS/MOOD NEUROLOGICAL BOWEL NUTRITION STATUS      Incontinent Diet (Regular)  AMBULATORY STATUS COMMUNICATION OF NEEDS Skin   Limited Assist Verbally Normal                       Personal Care Assistance Level of Assistance  Bathing, Dressing, Feeding Bathing Assistance: Limited assistance Feeding assistance: Limited assistance Dressing Assistance: Limited assistance     Functional Limitations Info  Sight, Hearing, Speech Sight Info: Adequate Hearing Info: Adequate Speech Info: Adequate    SPECIAL CARE FACTORS FREQUENCY  PT (By licensed PT), OT (By licensed OT)     PT Frequency: x5/week OT Frequency: x5/week            Contractures Contractures Info: Not present    Additional  Factors Info  Code Status Code Status Info: Full             Current Medications (04/13/2024):  This is the current hospital active medication list Current Facility-Administered Medications  Medication Dose Route Frequency Provider Last Rate Last Admin   donepezil  (ARICEPT ) tablet 10 mg  10 mg Oral Daily Bernis Ernst, PA-C       ezetimibe  (ZETIA ) tablet 10 mg  10 mg Oral Daily Bernis Ernst, PA-C       melatonin tablet 6 mg  6 mg Oral QHS Bernis Ernst, PA-C   6 mg at 04/12/24 2258   memantine  (NAMENDA ) tablet 10 mg  10 mg Oral BID Ransom, Riley, PA-C   10 mg at 04/12/24 2258   metFORMIN  (GLUCOPHAGE ) tablet 500 mg  500 mg Oral Q breakfast Bernis Ernst, PA-C       metoprolol  succinate (TOPROL -XL) 24 hr tablet 25 mg  25 mg Oral Daily Ransom, Riley, PA-C       multivitamin (PROSIGHT) tablet 1 tablet  1 tablet Oral Daily Ransom, Riley, PA-C       niacin  (VITAMIN B3) tablet 100 mg  100 mg Oral Daily Ransom, Riley, PA-C       QUEtiapine  (SEROQUEL ) tablet 25 mg  25 mg Oral QHS Bernis Ernst, PA-C   25 mg at 04/12/24 2258   sertraline  (ZOLOFT ) tablet 50 mg  50 mg Oral Daily Ransom, Riley, PA-C       tamsulosin  (FLOMAX ) capsule 0.4 mg  0.4 mg Oral Daily Bernis Ernst, PA-C       Current Outpatient Medications  Medication Sig Dispense Refill   donepezil  (ARICEPT ) 10 MG tablet Take 10 mg by mouth daily.     ezetimibe  (ZETIA ) 10 MG tablet Take 10 mg by mouth daily.  3   melatonin 3 MG TABS tablet Take 2 tablets (6 mg total) by mouth at bedtime. 10 tablet 0   memantine  (NAMENDA ) 10 MG tablet Take 10 mg by mouth 2 (two) times daily.  11   metFORMIN  (GLUCOPHAGE ) 500 MG tablet Take 500 mg by mouth daily with breakfast.     metoprolol  succinate (TOPROL -XL) 25 MG 24 hr tablet Take 25 mg by mouth daily.     Multiple Vitamins-Minerals (PRESERVISION AREDS 2) CAPS Take 2 capsules by mouth daily.     Niacin  (VITAMIN B-3 PO) Take 1 tablet by mouth daily.     QUEtiapine  (SEROQUEL ) 25 MG tablet Take 1 tablet  (25 mg total) by mouth at bedtime. 5 tablet 0   sertraline  (ZOLOFT ) 50 MG tablet Take 50 mg by mouth daily.  11   tamsulosin  (FLOMAX ) 0.4 MG CAPS capsule Take 0.4 mg by mouth daily.  3   clopidogrel  (PLAVIX ) 75 MG tablet Take 1 tablet (75 mg total) by mouth daily. Please request future refills from PCP. (Patient not taking: Reported on 04/09/2024) 30 tablet 0   lisinopril -hydrochlorothiazide  (PRINZIDE ,ZESTORETIC ) 20-12.5 MG tablet Take 1 tablet by mouth daily. (Patient not taking: Reported on 04/09/2024)  3     Discharge Medications: Please see discharge summary for a list of discharge medications.  Relevant Imaging Results:  Relevant Lab Results:   Additional Information SSN:[id number]  Kari JONETTA Daisy, LCSW

## 2024-04-13 NOTE — Progress Notes (Addendum)
 Pt faxed out. Bed offers pending.  Addend @ 10:36AM Pt does not have any bed offers. A few are considering.

## 2024-04-13 NOTE — ED Provider Notes (Signed)
 Emergency Medicine Observation Re-evaluation Note  Richard Frederick is a 83 y.o. male, seen on rounds today.  Pt initially presented to the ED for complaints of Social Work Currently, the patient is Resting comfortably.  Physical Exam  BP (!) 192/113   Pulse 87   Temp 98 F (36.7 C)   Resp 16   Ht 5' 7 (1.702 m)   Wt 81.6 kg   SpO2 91%   BMI 28.19 kg/m  Physical Exam General: nad Cardiac: good peripheral perfusion Lungs: bilateral chest rise Psych: resting comfortably   ED Course / MDM  EKG:   I have reviewed the labs performed to date as well as medications administered while in observation.  Recent changes in the last 24 hours include Patient recently admitted sent home but family unable to care for her, sent here for eval.  Plan for SNF placement.  Plan  Current plan is for SNF placement.    Emil Share, DO 04/13/24 [phone]

## 2024-04-13 NOTE — ED Notes (Signed)
 Pt's wife stated she spoke with [facility] [place] and that she reports that a nurse from [facility] will come in AM to perform an assessment of pt for facility.

## 2024-04-13 NOTE — ED Notes (Signed)
 Pt found naked with brief off. Clean brief placed on pt, pt repositioned in bed, and covered with blanket.   Pt tolerated well.

## 2024-04-13 NOTE — ED Notes (Signed)
 Patients family has returned with more concerns about the well being of the patient being housed in the hall way

## 2024-04-13 NOTE — ED Notes (Signed)
 Two meds from pharmacy havent arrived    message was sent asking for them

## 2024-04-14 LAB — CBG MONITORING, ED: Glucose-Capillary: 135 mg/dL — ABNORMAL HIGH (ref 70–99)

## 2024-04-14 NOTE — Progress Notes (Addendum)
 CSW spoke with pt's spouse to inquire about whether she's aware that [facility] would be private pay. Wife stated she was unaware and states this is her first time having to do this. CSW explained that pt's BCBS will only cover short term services and not LTC. Wife states pt does not qualify for Medicaid.   Addend @ 10:07AM [facility] rep coming to assess pt.   Addend @ 12:00PM CSW spoke with pt's spouse who became tearful and stated she was overhwhelmed and felt rush. She asked this Clinical research associate to outreach to [place] [facility] [place] [facility] [place] to inform her that she would like to cancel her meeting for 1pm. CSW outreached to [facility] and left a voicemail. CSW did have 3 way call with Richard Frederick and Richard Frederick. Richard Frederick was emotional and verbalized being overwhelmed and expressed concerns that pt is not walking. She stated she cannot let that go referring to letting the patient go to a memory care unit without first having skilled rehab. Richard Frederick stated she has been doing this for 20 years and she knows the emotional toll of having to place someone into memory care. She states she is only rushing as a response to how Richard Frederick presented the situation, noting pt was in a hall bed. CSW asked if Richard Frederick could respect Richard Frederick wishes as she has asked [facility] to assess the pt and is interested in him going to rehab there. Denise verbalized understanding. It was agreed upon that Richard Frederick will contact Richard Frederick if/when she is ready to place the pt there. ICM supervisor, Delon Lesches, present for entire conversation.

## 2024-04-14 NOTE — Progress Notes (Addendum)
 [facility] accepted pt. Wife wishes to accept. CSW initiating auth.   Addend @ 2:21PM Auth approved. Can admit to [facility] in the morning. Wife notified.   Addend @ 3:30PM CSW offered referral to wife for [facility] to arrange home care once pt is out of rehab. Wife accepted. Referral given to [facility].

## 2024-04-14 NOTE — ED Provider Notes (Signed)
 Emergency Medicine Observation Re-evaluation Note  Richard Frederick is a 83 y.o. male, seen on rounds today.  Pt initially presented to the ED for complaints of Social Work Currently, the patient is awaiting nursing home placement.  Physical Exam  BP (!) 170/102 (BP Location: Right Arm)   Pulse 97   Temp 97.6 F (36.4 C)   Resp 17   Ht 1.702 m (5' 7)   Wt 81.6 kg   SpO2 95%   BMI 28.19 kg/m  Physical Exam General: Nontoxic no acute distress. Cardiac:  Lungs: No respiratory distress Psych: Resting comfortably  ED Course / MDM  EKG:   I have reviewed the labs performed to date as well as medications administered while in observation.  Recent changes in the last 24 hours include nothing significant still waiting on nursing home placement.  Plan  Current plan is for nursing home placement.    Aylan Bayona, MD 04/14/24 [phone]

## 2024-04-14 NOTE — Progress Notes (Signed)
 Physical Therapy Treatment Patient Details Name: Richard Frederick MRN: [id number] DOB: 1941-01-15 Today's Date: 04/14/2024   History of Present Illness 83 y.o. male presents to ED  for  SNF placement. Patient was recently discharged from ED for acute encephalopathy with home health PT recommended. The wife thought that she was able to take care of him at home however brought him back for  SNF placement.  PMH: dementia, hypertension, hyperlipidemia, type 2 diabetes, paroxysmal A-fib    PT Comments  Pt in recliner, agreeable to PT. Denies pain or new concerns. Pt requires verbal and visual cues throughout the session and is able to come to sit upright in the recliner with CGA, requires mod A to stand and requires continued level of assist to maintain standing due to heavy posterior lean. Pt is unable to demonstrate terminal knee extension and quad weakness unable to support pt in standing without physical assist and use of AD. Pt completes 3 trials of standing with 20-30 seconds stand time before sitting with each trial. Pt completes LE exercises in sitting with continued limitations in full knee extension and weakness against gravity. Based on pt PLOF, level of support, and current functional status, pt will benefit from skilled inpatient physical therapy <3 hours per day at dc for safe progression of functional mobility.    If plan is discharge home, recommend the following: A little help with walking and/or transfers;A little help with bathing/dressing/bathroom;Direct supervision/assist for medications management;Assist for transportation;Assistance with cooking/housework;Help with stairs or ramp for entrance;Supervision due to cognitive status   Can travel by private vehicle     No  Equipment Recommendations  Other (comment) (TBD at next venue)    Recommendations for Other Services       Precautions / Restrictions Precautions Precautions: Fall Recall of Precautions/Restrictions:  Impaired Restrictions Weight Bearing Restrictions Per Provider Order: No     Mobility  Bed Mobility               General bed mobility comments: in recliner when PT arrives, remains in recliner post session    Transfers Overall transfer level: Needs assistance Equipment used: Rolling walker (2 wheels) Transfers: Sit to/from Stand Sit to Stand: Min assist, +2 safety/equipment           General transfer comment: Pt requires assist to stand and requires continued assist to maintain balance with a heavy posterior lean, progressive mobility/amb unsafe this day    Ambulation/Gait                   Stairs             Wheelchair Mobility     Tilt Bed    Modified Rankin (Stroke Patients Only)       Balance Overall balance assessment: Needs assistance Sitting-balance support: Bilateral upper extremity supported, Feet supported Sitting balance-Leahy Scale: Poor     Standing balance support: Bilateral upper extremity supported, During functional activity, Reliant on assistive device for balance Standing balance-Leahy Scale: Poor                              Communication Communication Communication: No apparent difficulties  Cognition Arousal: Alert Behavior During Therapy: WFL for tasks assessed/performed   PT - Cognitive impairments: History of cognitive impairments                       PT - Cognition Comments: alert, cooperative with  PT. unaware that he was covered in feces--pt states it is tar Following commands: Impaired Following commands impaired: Only follows one step commands consistently, Follows multi-step commands inconsistently    Cueing Cueing Techniques: Verbal cues, Gestural cues, Tactile cues  Exercises General Exercises - Lower Extremity Long Arc Quad: AROM, Both, 15 reps, Seated Hip Flexion/Marching: AROM, Both, 15 reps, Seated    General Comments        Pertinent Vitals/Pain Pain  Assessment Pain Assessment: No/denies pain    Home Living                          Prior Function            PT Goals (current goals can now be found in the care plan section) Acute Rehab PT Goals Patient Stated Goal: none stated/unable PT Goal Formulation: Patient unable to participate in goal setting Time For Goal Achievement: 04/26/24 Potential to Achieve Goals: Fair Progress towards PT goals: Progressing toward goals    Frequency    Min 2X/week      PT Plan      Co-evaluation              AM-PAC PT 6 Clicks Mobility   Outcome Measure  Help needed turning from your back to your side while in a flat bed without using bedrails?: A Little Help needed moving from lying on your back to sitting on the side of a flat bed without using bedrails?: A Little Help needed moving to and from a bed to a chair (including a wheelchair)?: A Lot Help needed standing up from a chair using your arms (e.g., wheelchair or bedside chair)?: A Lot Help needed to walk in hospital room?: Total Help needed climbing 3-5 steps with a railing? : Total 6 Click Score: 12    End of Session Equipment Utilized During Treatment: Gait belt Activity Tolerance: Patient tolerated treatment well Patient left: in chair;with call bell/phone within reach;with chair alarm set Nurse Communication: Mobility status PT Visit Diagnosis: Unsteadiness on feet (R26.81);Other abnormalities of gait and mobility (R26.89);Muscle weakness (generalized) (M62.81);History of falling (Z91.81)     Time: 8765-8749 PT Time Calculation (min) (ACUTE ONLY): 16 min  Charges:    $Therapeutic Activity: 8-22 mins PT General Charges $$ ACUTE PT VISIT: 1 Visit                     Richard Frederick, PT Acute Rehabilitation Services Office: [phone] 04/14/2024    Richard Frederick 04/14/2024, 1:10 PM

## 2024-04-15 ENCOUNTER — Ambulatory Visit: Admitting: Podiatry

## 2024-04-15 DIAGNOSIS — G9341 Metabolic encephalopathy: Secondary | ICD-10-CM | POA: Diagnosis not present

## 2024-04-15 DIAGNOSIS — R2681 Unsteadiness on feet: Secondary | ICD-10-CM | POA: Diagnosis not present

## 2024-04-15 DIAGNOSIS — W19XXXD Unspecified fall, subsequent encounter: Secondary | ICD-10-CM | POA: Diagnosis not present

## 2024-04-15 DIAGNOSIS — E119 Type 2 diabetes mellitus without complications: Secondary | ICD-10-CM | POA: Diagnosis not present

## 2024-04-15 DIAGNOSIS — N401 Enlarged prostate with lower urinary tract symptoms: Secondary | ICD-10-CM | POA: Diagnosis not present

## 2024-04-15 DIAGNOSIS — R339 Retention of urine, unspecified: Secondary | ICD-10-CM | POA: Diagnosis not present

## 2024-04-15 DIAGNOSIS — Z803 Family history of malignant neoplasm of breast: Secondary | ICD-10-CM | POA: Diagnosis not present

## 2024-04-15 DIAGNOSIS — M6281 Muscle weakness (generalized): Secondary | ICD-10-CM | POA: Diagnosis not present

## 2024-04-15 DIAGNOSIS — E785 Hyperlipidemia, unspecified: Secondary | ICD-10-CM | POA: Diagnosis not present

## 2024-04-15 DIAGNOSIS — F17210 Nicotine dependence, cigarettes, uncomplicated: Secondary | ICD-10-CM | POA: Diagnosis not present

## 2024-04-15 DIAGNOSIS — Z9181 History of falling: Secondary | ICD-10-CM | POA: Diagnosis not present

## 2024-04-15 DIAGNOSIS — F03C11 Unspecified dementia, severe, with agitation: Secondary | ICD-10-CM | POA: Diagnosis not present

## 2024-04-15 DIAGNOSIS — Z7189 Other specified counseling: Secondary | ICD-10-CM | POA: Diagnosis not present

## 2024-04-15 DIAGNOSIS — Z7902 Long term (current) use of antithrombotics/antiplatelets: Secondary | ICD-10-CM | POA: Diagnosis not present

## 2024-04-15 DIAGNOSIS — F32A Depression, unspecified: Secondary | ICD-10-CM | POA: Diagnosis not present

## 2024-04-15 DIAGNOSIS — R41841 Cognitive communication deficit: Secondary | ICD-10-CM | POA: Diagnosis not present

## 2024-04-15 DIAGNOSIS — Z7401 Bed confinement status: Secondary | ICD-10-CM | POA: Diagnosis not present

## 2024-04-15 DIAGNOSIS — E1122 Type 2 diabetes mellitus with diabetic chronic kidney disease: Secondary | ICD-10-CM | POA: Diagnosis not present

## 2024-04-15 DIAGNOSIS — S299XXA Unspecified injury of thorax, initial encounter: Secondary | ICD-10-CM | POA: Diagnosis not present

## 2024-04-15 DIAGNOSIS — I10 Essential (primary) hypertension: Secondary | ICD-10-CM | POA: Diagnosis not present

## 2024-04-15 DIAGNOSIS — N400 Enlarged prostate without lower urinary tract symptoms: Secondary | ICD-10-CM | POA: Diagnosis not present

## 2024-04-15 DIAGNOSIS — Z8249 Family history of ischemic heart disease and other diseases of the circulatory system: Secondary | ICD-10-CM | POA: Diagnosis not present

## 2024-04-15 DIAGNOSIS — M549 Dorsalgia, unspecified: Secondary | ICD-10-CM | POA: Diagnosis not present

## 2024-04-15 DIAGNOSIS — Z66 Do not resuscitate: Secondary | ICD-10-CM | POA: Diagnosis not present

## 2024-04-15 DIAGNOSIS — E876 Hypokalemia: Secondary | ICD-10-CM | POA: Diagnosis not present

## 2024-04-15 DIAGNOSIS — S199XXA Unspecified injury of neck, initial encounter: Secondary | ICD-10-CM | POA: Diagnosis not present

## 2024-04-15 DIAGNOSIS — Z23 Encounter for immunization: Secondary | ICD-10-CM | POA: Diagnosis not present

## 2024-04-15 DIAGNOSIS — R338 Other retention of urine: Secondary | ICD-10-CM | POA: Diagnosis not present

## 2024-04-15 DIAGNOSIS — R4182 Altered mental status, unspecified: Secondary | ICD-10-CM | POA: Diagnosis not present

## 2024-04-15 DIAGNOSIS — B9620 Unspecified Escherichia coli [E. coli] as the cause of diseases classified elsewhere: Secondary | ICD-10-CM | POA: Diagnosis not present

## 2024-04-15 DIAGNOSIS — Z809 Family history of malignant neoplasm, unspecified: Secondary | ICD-10-CM | POA: Diagnosis not present

## 2024-04-15 DIAGNOSIS — D72829 Elevated white blood cell count, unspecified: Secondary | ICD-10-CM | POA: Diagnosis not present

## 2024-04-15 DIAGNOSIS — G9340 Encephalopathy, unspecified: Secondary | ICD-10-CM | POA: Diagnosis not present

## 2024-04-15 DIAGNOSIS — N1832 Chronic kidney disease, stage 3b: Secondary | ICD-10-CM | POA: Diagnosis not present

## 2024-04-15 DIAGNOSIS — R1311 Dysphagia, oral phase: Secondary | ICD-10-CM | POA: Diagnosis not present

## 2024-04-15 DIAGNOSIS — F02C11 Dementia in other diseases classified elsewhere, severe, with agitation: Secondary | ICD-10-CM | POA: Diagnosis not present

## 2024-04-15 DIAGNOSIS — R531 Weakness: Secondary | ICD-10-CM | POA: Diagnosis not present

## 2024-04-15 DIAGNOSIS — N179 Acute kidney failure, unspecified: Secondary | ICD-10-CM | POA: Diagnosis not present

## 2024-04-15 DIAGNOSIS — Z823 Family history of stroke: Secondary | ICD-10-CM | POA: Diagnosis not present

## 2024-04-15 DIAGNOSIS — S0990XA Unspecified injury of head, initial encounter: Secondary | ICD-10-CM | POA: Diagnosis not present

## 2024-04-15 DIAGNOSIS — E860 Dehydration: Secondary | ICD-10-CM | POA: Diagnosis not present

## 2024-04-15 DIAGNOSIS — Z043 Encounter for examination and observation following other accident: Secondary | ICD-10-CM | POA: Diagnosis not present

## 2024-04-15 DIAGNOSIS — S3991XA Unspecified injury of abdomen, initial encounter: Secondary | ICD-10-CM | POA: Diagnosis not present

## 2024-04-15 DIAGNOSIS — R451 Restlessness and agitation: Secondary | ICD-10-CM | POA: Diagnosis not present

## 2024-04-15 DIAGNOSIS — N390 Urinary tract infection, site not specified: Secondary | ICD-10-CM | POA: Diagnosis not present

## 2024-04-15 DIAGNOSIS — Y92009 Unspecified place in unspecified non-institutional (private) residence as the place of occurrence of the external cause: Secondary | ICD-10-CM | POA: Diagnosis not present

## 2024-04-15 DIAGNOSIS — I672 Cerebral atherosclerosis: Secondary | ICD-10-CM | POA: Diagnosis not present

## 2024-04-15 DIAGNOSIS — M6282 Rhabdomyolysis: Secondary | ICD-10-CM | POA: Diagnosis not present

## 2024-04-15 DIAGNOSIS — F0390 Unspecified dementia without behavioral disturbance: Secondary | ICD-10-CM | POA: Diagnosis not present

## 2024-04-15 DIAGNOSIS — Z741 Need for assistance with personal care: Secondary | ICD-10-CM | POA: Diagnosis not present

## 2024-04-15 DIAGNOSIS — R9389 Abnormal findings on diagnostic imaging of other specified body structures: Secondary | ICD-10-CM | POA: Diagnosis not present

## 2024-04-15 DIAGNOSIS — W19XXXA Unspecified fall, initial encounter: Secondary | ICD-10-CM | POA: Diagnosis present

## 2024-04-15 DIAGNOSIS — Z781 Physical restraint status: Secondary | ICD-10-CM | POA: Diagnosis not present

## 2024-04-15 DIAGNOSIS — Z7984 Long term (current) use of oral hypoglycemic drugs: Secondary | ICD-10-CM | POA: Diagnosis not present

## 2024-04-15 DIAGNOSIS — M6259 Muscle wasting and atrophy, not elsewhere classified, multiple sites: Secondary | ICD-10-CM | POA: Diagnosis not present

## 2024-04-15 DIAGNOSIS — I129 Hypertensive chronic kidney disease with stage 1 through stage 4 chronic kidney disease, or unspecified chronic kidney disease: Secondary | ICD-10-CM | POA: Diagnosis not present

## 2024-04-15 DIAGNOSIS — N3289 Other specified disorders of bladder: Secondary | ICD-10-CM | POA: Diagnosis not present

## 2024-04-15 DIAGNOSIS — Z515 Encounter for palliative care: Secondary | ICD-10-CM | POA: Diagnosis not present

## 2024-04-15 DIAGNOSIS — I4891 Unspecified atrial fibrillation: Secondary | ICD-10-CM | POA: Diagnosis not present

## 2024-04-15 DIAGNOSIS — I480 Paroxysmal atrial fibrillation: Secondary | ICD-10-CM | POA: Diagnosis not present

## 2024-04-15 DIAGNOSIS — R918 Other nonspecific abnormal finding of lung field: Secondary | ICD-10-CM | POA: Diagnosis not present

## 2024-04-15 DIAGNOSIS — H4921 Sixth [abducent] nerve palsy, right eye: Secondary | ICD-10-CM | POA: Diagnosis not present

## 2024-04-15 LAB — CBG MONITORING, ED: Glucose-Capillary: 121 mg/dL — ABNORMAL HIGH (ref 70–99)

## 2024-04-15 NOTE — Progress Notes (Signed)
 Pt discharging this morning to [facility]. [facility] to transport. Wife notified.

## 2024-04-15 NOTE — ED Provider Notes (Signed)
 Emergency Medicine Observation Re-evaluation Note  Richard Frederick is a 83 y.o. male, seen on rounds today.  Pt initially presented to the ED for complaints of Social Work Currently, the patient is sitting up eating breakfast.  Physical Exam  BP (!) 154/94 (BP Location: Right Arm)   Pulse 89   Temp 98.1 F (36.7 C) (Axillary)   Resp 18   Ht 5' 7 (1.702 m)   Wt 81.6 kg   SpO2 98%   BMI 28.19 kg/m  Physical Exam General: No acute distress Cardiac: Regular Lungs: Clear Psych: Calm and cooperative  ED Course / MDM  EKG:   I have reviewed the labs performed to date as well as medications administered while in observation.  Recent changes in the last 24 hours include none.  Plan  Current plan is for patient will be discharged to his skilled facility this morning.    Doretha Folks, MD 04/15/24 [phone]

## 2024-04-17 DIAGNOSIS — N400 Enlarged prostate without lower urinary tract symptoms: Secondary | ICD-10-CM | POA: Diagnosis not present

## 2024-04-17 DIAGNOSIS — F32A Depression, unspecified: Secondary | ICD-10-CM | POA: Diagnosis not present

## 2024-04-17 DIAGNOSIS — E785 Hyperlipidemia, unspecified: Secondary | ICD-10-CM | POA: Diagnosis not present

## 2024-04-17 DIAGNOSIS — F0390 Unspecified dementia without behavioral disturbance: Secondary | ICD-10-CM | POA: Diagnosis not present

## 2024-04-20 DIAGNOSIS — G9340 Encephalopathy, unspecified: Secondary | ICD-10-CM | POA: Diagnosis not present

## 2024-04-20 DIAGNOSIS — F0390 Unspecified dementia without behavioral disturbance: Secondary | ICD-10-CM | POA: Diagnosis not present

## 2024-04-20 DIAGNOSIS — R531 Weakness: Secondary | ICD-10-CM | POA: Diagnosis not present

## 2024-04-21 ENCOUNTER — Other Ambulatory Visit: Payer: Self-pay

## 2024-04-21 ENCOUNTER — Emergency Department (HOSPITAL_COMMUNITY)

## 2024-04-21 ENCOUNTER — Inpatient Hospital Stay (HOSPITAL_COMMUNITY)
Admission: EM | Admit: 2024-04-21 | Discharge: 2024-04-27 | DRG: 689 | Disposition: A | Discharge status: Skilled Nursing Facility | Source: Skilled Nursing Facility | Attending: Internal Medicine | Admitting: Internal Medicine

## 2024-04-21 DIAGNOSIS — N179 Acute kidney failure, unspecified: Secondary | ICD-10-CM | POA: Diagnosis not present

## 2024-04-21 DIAGNOSIS — R9082 White matter disease, unspecified: Secondary | ICD-10-CM | POA: Diagnosis not present

## 2024-04-21 DIAGNOSIS — G319 Degenerative disease of nervous system, unspecified: Secondary | ICD-10-CM | POA: Diagnosis not present

## 2024-04-21 DIAGNOSIS — Z781 Physical restraint status: Secondary | ICD-10-CM

## 2024-04-21 DIAGNOSIS — B9620 Unspecified Escherichia coli [E. coli] as the cause of diseases classified elsewhere: Secondary | ICD-10-CM | POA: Diagnosis present

## 2024-04-21 DIAGNOSIS — W19XXXA Unspecified fall, initial encounter: Secondary | ICD-10-CM | POA: Diagnosis present

## 2024-04-21 DIAGNOSIS — Z23 Encounter for immunization: Secondary | ICD-10-CM | POA: Diagnosis not present

## 2024-04-21 DIAGNOSIS — Z7984 Long term (current) use of oral hypoglycemic drugs: Secondary | ICD-10-CM

## 2024-04-21 DIAGNOSIS — Z515 Encounter for palliative care: Secondary | ICD-10-CM

## 2024-04-21 DIAGNOSIS — R0989 Other specified symptoms and signs involving the circulatory and respiratory systems: Secondary | ICD-10-CM | POA: Diagnosis not present

## 2024-04-21 DIAGNOSIS — G9341 Metabolic encephalopathy: Secondary | ICD-10-CM | POA: Diagnosis present

## 2024-04-21 DIAGNOSIS — Z79899 Other long term (current) drug therapy: Secondary | ICD-10-CM

## 2024-04-21 DIAGNOSIS — M6282 Rhabdomyolysis: Secondary | ICD-10-CM | POA: Diagnosis not present

## 2024-04-21 DIAGNOSIS — N3289 Other specified disorders of bladder: Secondary | ICD-10-CM | POA: Diagnosis not present

## 2024-04-21 DIAGNOSIS — N401 Enlarged prostate with lower urinary tract symptoms: Secondary | ICD-10-CM | POA: Diagnosis present

## 2024-04-21 DIAGNOSIS — I4891 Unspecified atrial fibrillation: Secondary | ICD-10-CM | POA: Diagnosis not present

## 2024-04-21 DIAGNOSIS — R339 Retention of urine, unspecified: Secondary | ICD-10-CM | POA: Diagnosis not present

## 2024-04-21 DIAGNOSIS — Z66 Do not resuscitate: Secondary | ICD-10-CM | POA: Diagnosis present

## 2024-04-21 DIAGNOSIS — E860 Dehydration: Secondary | ICD-10-CM | POA: Diagnosis not present

## 2024-04-21 DIAGNOSIS — Z043 Encounter for examination and observation following other accident: Secondary | ICD-10-CM | POA: Diagnosis not present

## 2024-04-21 DIAGNOSIS — Z9841 Cataract extraction status, right eye: Secondary | ICD-10-CM

## 2024-04-21 DIAGNOSIS — Z823 Family history of stroke: Secondary | ICD-10-CM

## 2024-04-21 DIAGNOSIS — F03C11 Unspecified dementia, severe, with agitation: Secondary | ICD-10-CM | POA: Diagnosis not present

## 2024-04-21 DIAGNOSIS — S0990XA Unspecified injury of head, initial encounter: Secondary | ICD-10-CM | POA: Diagnosis not present

## 2024-04-21 DIAGNOSIS — Z8249 Family history of ischemic heart disease and other diseases of the circulatory system: Secondary | ICD-10-CM

## 2024-04-21 DIAGNOSIS — Y92009 Unspecified place in unspecified non-institutional (private) residence as the place of occurrence of the external cause: Secondary | ICD-10-CM | POA: Diagnosis not present

## 2024-04-21 DIAGNOSIS — E876 Hypokalemia: Secondary | ICD-10-CM | POA: Diagnosis not present

## 2024-04-21 DIAGNOSIS — Z803 Family history of malignant neoplasm of breast: Secondary | ICD-10-CM

## 2024-04-21 DIAGNOSIS — I129 Hypertensive chronic kidney disease with stage 1 through stage 4 chronic kidney disease, or unspecified chronic kidney disease: Secondary | ICD-10-CM | POA: Diagnosis not present

## 2024-04-21 DIAGNOSIS — S299XXA Unspecified injury of thorax, initial encounter: Secondary | ICD-10-CM | POA: Diagnosis not present

## 2024-04-21 DIAGNOSIS — Z8673 Personal history of transient ischemic attack (TIA), and cerebral infarction without residual deficits: Secondary | ICD-10-CM

## 2024-04-21 DIAGNOSIS — Z7902 Long term (current) use of antithrombotics/antiplatelets: Secondary | ICD-10-CM

## 2024-04-21 DIAGNOSIS — Z961 Presence of intraocular lens: Secondary | ICD-10-CM | POA: Diagnosis present

## 2024-04-21 DIAGNOSIS — Z888 Allergy status to other drugs, medicaments and biological substances status: Secondary | ICD-10-CM

## 2024-04-21 DIAGNOSIS — Z809 Family history of malignant neoplasm, unspecified: Secondary | ICD-10-CM

## 2024-04-21 DIAGNOSIS — R451 Restlessness and agitation: Secondary | ICD-10-CM | POA: Diagnosis not present

## 2024-04-21 DIAGNOSIS — I10 Essential (primary) hypertension: Secondary | ICD-10-CM | POA: Diagnosis not present

## 2024-04-21 DIAGNOSIS — S199XXA Unspecified injury of neck, initial encounter: Secondary | ICD-10-CM | POA: Diagnosis not present

## 2024-04-21 DIAGNOSIS — D72829 Elevated white blood cell count, unspecified: Secondary | ICD-10-CM

## 2024-04-21 DIAGNOSIS — R0602 Shortness of breath: Secondary | ICD-10-CM | POA: Diagnosis not present

## 2024-04-21 DIAGNOSIS — N390 Urinary tract infection, site not specified: Principal | ICD-10-CM | POA: Diagnosis present

## 2024-04-21 DIAGNOSIS — E785 Hyperlipidemia, unspecified: Secondary | ICD-10-CM | POA: Diagnosis present

## 2024-04-21 DIAGNOSIS — I480 Paroxysmal atrial fibrillation: Secondary | ICD-10-CM | POA: Diagnosis not present

## 2024-04-21 DIAGNOSIS — E119 Type 2 diabetes mellitus without complications: Secondary | ICD-10-CM

## 2024-04-21 DIAGNOSIS — R296 Repeated falls: Secondary | ICD-10-CM | POA: Diagnosis present

## 2024-04-21 DIAGNOSIS — J9811 Atelectasis: Secondary | ICD-10-CM | POA: Diagnosis not present

## 2024-04-21 DIAGNOSIS — Z7189 Other specified counseling: Secondary | ICD-10-CM | POA: Diagnosis not present

## 2024-04-21 DIAGNOSIS — R9389 Abnormal findings on diagnostic imaging of other specified body structures: Secondary | ICD-10-CM | POA: Diagnosis not present

## 2024-04-21 DIAGNOSIS — W19XXXD Unspecified fall, subsequent encounter: Secondary | ICD-10-CM | POA: Diagnosis not present

## 2024-04-21 DIAGNOSIS — R338 Other retention of urine: Secondary | ICD-10-CM | POA: Diagnosis present

## 2024-04-21 DIAGNOSIS — Z9181 History of falling: Secondary | ICD-10-CM

## 2024-04-21 DIAGNOSIS — E1122 Type 2 diabetes mellitus with diabetic chronic kidney disease: Secondary | ICD-10-CM | POA: Diagnosis not present

## 2024-04-21 DIAGNOSIS — M549 Dorsalgia, unspecified: Secondary | ICD-10-CM | POA: Diagnosis not present

## 2024-04-21 DIAGNOSIS — R4182 Altered mental status, unspecified: Secondary | ICD-10-CM | POA: Diagnosis not present

## 2024-04-21 DIAGNOSIS — F02C11 Dementia in other diseases classified elsewhere, severe, with agitation: Secondary | ICD-10-CM | POA: Diagnosis present

## 2024-04-21 DIAGNOSIS — R4781 Slurred speech: Secondary | ICD-10-CM | POA: Diagnosis present

## 2024-04-21 DIAGNOSIS — N1832 Chronic kidney disease, stage 3b: Secondary | ICD-10-CM | POA: Diagnosis not present

## 2024-04-21 DIAGNOSIS — R918 Other nonspecific abnormal finding of lung field: Secondary | ICD-10-CM | POA: Diagnosis not present

## 2024-04-21 DIAGNOSIS — I672 Cerebral atherosclerosis: Secondary | ICD-10-CM | POA: Diagnosis not present

## 2024-04-21 DIAGNOSIS — S3991XA Unspecified injury of abdomen, initial encounter: Secondary | ICD-10-CM | POA: Diagnosis not present

## 2024-04-21 DIAGNOSIS — F0390 Unspecified dementia without behavioral disturbance: Secondary | ICD-10-CM | POA: Diagnosis not present

## 2024-04-21 DIAGNOSIS — F17210 Nicotine dependence, cigarettes, uncomplicated: Secondary | ICD-10-CM | POA: Diagnosis present

## 2024-04-21 DIAGNOSIS — N320 Bladder-neck obstruction: Secondary | ICD-10-CM | POA: Diagnosis present

## 2024-04-21 LAB — CBC WITH DIFFERENTIAL/PLATELET
Abs Immature Granulocytes: 0.13 K/uL — ABNORMAL HIGH (ref 0.00–0.07)
Basophils Absolute: 0.1 K/uL (ref 0.0–0.1)
Basophils Relative: 0 %
Eosinophils Absolute: 0.3 K/uL (ref 0.0–0.5)
Eosinophils Relative: 1 %
HCT: 48.1 % (ref 39.0–52.0)
Hemoglobin: 15.7 g/dL (ref 13.0–17.0)
Immature Granulocytes: 1 %
Lymphocytes Relative: 4 %
Lymphs Abs: 1.1 K/uL (ref 0.7–4.0)
MCH: 28.3 pg (ref 26.0–34.0)
MCHC: 32.6 g/dL (ref 30.0–36.0)
MCV: 86.8 fL (ref 80.0–100.0)
Monocytes Absolute: 1.5 K/uL — ABNORMAL HIGH (ref 0.1–1.0)
Monocytes Relative: 6 %
Neutro Abs: 22.4 K/uL — ABNORMAL HIGH (ref 1.7–7.7)
Neutrophils Relative %: 88 %
Platelets: 346 K/uL (ref 150–400)
RBC: 5.54 MIL/uL (ref 4.22–5.81)
RDW: 13.2 % (ref 11.5–15.5)
WBC: 25.4 K/uL — ABNORMAL HIGH (ref 4.0–10.5)
nRBC: 0 % (ref 0.0–0.2)

## 2024-04-21 LAB — COMPREHENSIVE METABOLIC PANEL WITH GFR
ALT: 61 U/L — ABNORMAL HIGH (ref 0–44)
AST: 42 U/L — ABNORMAL HIGH (ref 15–41)
Albumin: 3.3 g/dL — ABNORMAL LOW (ref 3.5–5.0)
Alkaline Phosphatase: 77 U/L (ref 38–126)
Anion gap: 16 — ABNORMAL HIGH (ref 5–15)
BUN: 62 mg/dL — ABNORMAL HIGH (ref 8–23)
CO2: 26 mmol/L (ref 22–32)
Calcium: 10.1 mg/dL (ref 8.9–10.3)
Chloride: 101 mmol/L (ref 98–111)
Creatinine, Ser: 2.03 mg/dL — ABNORMAL HIGH (ref 0.61–1.24)
GFR, Estimated: 32 mL/min — ABNORMAL LOW (ref >60)
Glucose, Bld: 173 mg/dL — ABNORMAL HIGH (ref 70–99)
Potassium: 3.6 mmol/L (ref 3.5–5.1)
Sodium: 143 mmol/L (ref 135–145)
Total Bilirubin: 1.1 mg/dL (ref 0.0–1.2)
Total Protein: 7.2 g/dL (ref 6.5–8.1)

## 2024-04-21 LAB — I-STAT CHEM 8, ED
BUN: 37 mg/dL — ABNORMAL HIGH (ref 8–23)
Calcium, Ion: 0.76 mmol/L — CL (ref 1.15–1.40)
Chloride: 120 mmol/L — ABNORMAL HIGH (ref 98–111)
Creatinine, Ser: 0.9 mg/dL (ref 0.61–1.24)
Glucose, Bld: 103 mg/dL — ABNORMAL HIGH (ref 70–99)
HCT: 27 % — ABNORMAL LOW (ref 39.0–52.0)
Hemoglobin: 9.2 g/dL — ABNORMAL LOW (ref 13.0–17.0)
Potassium: 2 mmol/L — CL (ref 3.5–5.1)
Sodium: 151 mmol/L — ABNORMAL HIGH (ref 135–145)
TCO2: 15 mmol/L — ABNORMAL LOW (ref 22–32)

## 2024-04-21 LAB — URINALYSIS, ROUTINE W REFLEX MICROSCOPIC
Bilirubin Urine: NEGATIVE
Glucose, UA: NEGATIVE mg/dL
Hgb urine dipstick: NEGATIVE
Ketones, ur: NEGATIVE mg/dL
Nitrite: NEGATIVE
Protein, ur: 30 mg/dL — AB
Specific Gravity, Urine: 1.015 (ref 1.005–1.030)
pH: 5 (ref 5.0–8.0)

## 2024-04-21 LAB — I-STAT CG4 LACTIC ACID, ED
Lactic Acid, Venous: 1.6 mmol/L (ref 0.5–1.9)
Lactic Acid, Venous: 1.5 mmol/L (ref 0.5–1.9)

## 2024-04-21 LAB — CK: Total CK: 735 U/L — ABNORMAL HIGH (ref 49–397)

## 2024-04-21 LAB — MAGNESIUM: Magnesium: 1.7 mg/dL (ref 1.7–2.4)

## 2024-04-21 LAB — TSH: TSH: 4.203 u[IU]/mL (ref 0.350–4.500)

## 2024-04-21 MED ORDER — HALOPERIDOL LACTATE 5 MG/ML IJ SOLN
2.0000 mg | Freq: Once | INTRAMUSCULAR | Status: AC
  Administered 2024-04-21: 2 mg via INTRAVENOUS
  Filled 2024-04-21: qty 1

## 2024-04-21 MED ORDER — ACETAMINOPHEN 325 MG PO TABS
650.0000 mg | ORAL_TABLET | Freq: Four times a day (QID) | ORAL | Status: DC | PRN
  Administered 2024-04-22: 650 mg via ORAL
  Filled 2024-04-21: qty 2

## 2024-04-21 MED ORDER — DONEPEZIL HCL 10 MG PO TABS
10.0000 mg | ORAL_TABLET | Freq: Every day | ORAL | Status: DC
  Administered 2024-04-22 – 2024-04-27 (×6): 10 mg via ORAL
  Filled 2024-04-21 (×6): qty 1

## 2024-04-21 MED ORDER — SODIUM CHLORIDE 0.9 % IV BOLUS
500.0000 mL | Freq: Once | INTRAVENOUS | Status: AC
  Administered 2024-04-21: 500 mL via INTRAVENOUS

## 2024-04-21 MED ORDER — HEPARIN SODIUM (PORCINE) 5000 UNIT/ML IJ SOLN
5000.0000 [IU] | Freq: Three times a day (TID) | INTRAMUSCULAR | Status: DC
  Administered 2024-04-21 – 2024-04-27 (×16): 5000 [IU] via SUBCUTANEOUS
  Filled 2024-04-21 (×15): qty 1

## 2024-04-21 MED ORDER — CEFTRIAXONE SODIUM 1 G IJ SOLR: 1.0000 g | INTRAMUSCULAR | Status: DC

## 2024-04-21 MED ORDER — QUETIAPINE FUMARATE 25 MG PO TABS
25.0000 mg | ORAL_TABLET | ORAL | Status: AC
  Administered 2024-04-21: 25 mg via ORAL
  Filled 2024-04-21: qty 1

## 2024-04-21 MED ORDER — SODIUM CHLORIDE 0.9 % IV SOLN
1.0000 g | Freq: Once | INTRAVENOUS | Status: AC
  Administered 2024-04-21: 1 g via INTRAVENOUS
  Filled 2024-04-21: qty 10

## 2024-04-21 MED ORDER — POTASSIUM CHLORIDE CRYS ER 20 MEQ PO TBCR
40.0000 meq | EXTENDED_RELEASE_TABLET | Freq: Once | ORAL | Status: AC
  Administered 2024-04-21: 40 meq via ORAL
  Filled 2024-04-21: qty 2

## 2024-04-21 MED ORDER — SERTRALINE HCL 50 MG PO TABS
50.0000 mg | ORAL_TABLET | Freq: Every day | ORAL | Status: DC
Start: 2024-04-21 — End: 2024-04-27
  Administered 2024-04-22 – 2024-04-27 (×6): 50 mg via ORAL
  Filled 2024-04-21 (×6): qty 1

## 2024-04-21 MED ORDER — LACTATED RINGERS IV SOLN: INTRAVENOUS | Status: AC

## 2024-04-21 MED ORDER — SODIUM CHLORIDE 0.9% FLUSH
3.0000 mL | Freq: Two times a day (BID) | INTRAVENOUS | Status: DC
  Administered 2024-04-21 – 2024-04-27 (×10): 3 mL via INTRAVENOUS

## 2024-04-21 MED ORDER — QUETIAPINE FUMARATE 25 MG PO TABS
25.0000 mg | ORAL_TABLET | Freq: Every day | ORAL | Status: DC
Start: 2024-04-22 — End: 2024-04-24
  Administered 2024-04-22 – 2024-04-23 (×2): 25 mg via ORAL
  Filled 2024-04-21 (×2): qty 1

## 2024-04-21 MED ORDER — POTASSIUM CHLORIDE 10 MEQ/100ML IV SOLN
10.0000 meq | INTRAVENOUS | Status: DC
  Administered 2024-04-21: 10 meq via INTRAVENOUS
  Filled 2024-04-21: qty 100

## 2024-04-21 MED ORDER — METOPROLOL TARTRATE 5 MG/5ML IV SOLN
5.0000 mg | Freq: Once | INTRAVENOUS | Status: AC
  Administered 2024-04-21: 5 mg via INTRAVENOUS
  Filled 2024-04-21: qty 5

## 2024-04-21 MED ORDER — IOHEXOL 350 MG/ML SOLN
75.0000 mL | Freq: Once | INTRAVENOUS | Status: AC | PRN
  Administered 2024-04-21: 75 mL via INTRAVENOUS

## 2024-04-21 MED ORDER — ACETAMINOPHEN 650 MG RE SUPP: 650.0000 mg | Freq: Four times a day (QID) | RECTAL | Status: DC | PRN

## 2024-04-21 MED ORDER — LACTATED RINGERS IV SOLN: INTRAVENOUS | Status: DC

## 2024-04-21 MED ORDER — MEMANTINE HCL 10 MG PO TABS
10.0000 mg | ORAL_TABLET | Freq: Two times a day (BID) | ORAL | Status: DC
  Administered 2024-04-22 – 2024-04-27 (×10): 10 mg via ORAL
  Filled 2024-04-21 (×10): qty 1

## 2024-04-21 MED ORDER — DILTIAZEM HCL-DEXTROSE 125-5 MG/125ML-% IV SOLN (PREMIX)
5.0000 mg/h | INTRAVENOUS | Status: AC
  Administered 2024-04-21: 5 mg/h via INTRAVENOUS
  Administered 2024-04-22: 12.5 mg/h via INTRAVENOUS
  Filled 2024-04-21 (×3): qty 125

## 2024-04-21 MED ORDER — MAGNESIUM SULFATE IN D5W 1-5 GM/100ML-% IV SOLN
1.0000 g | Freq: Once | INTRAVENOUS | Status: AC
  Administered 2024-04-21: 1 g via INTRAVENOUS
  Filled 2024-04-21: qty 100

## 2024-04-21 MED ORDER — HYDRALAZINE HCL 20 MG/ML IJ SOLN: 5.0000 mg | INTRAMUSCULAR | Status: DC | PRN

## 2024-04-21 MED ORDER — METOPROLOL SUCCINATE ER 25 MG PO TB24
25.0000 mg | ORAL_TABLET | Freq: Every day | ORAL | Status: DC
  Administered 2024-04-21: 25 mg via ORAL
  Filled 2024-04-21: qty 1

## 2024-04-21 MED ORDER — TAMSULOSIN HCL 0.4 MG PO CAPS
0.4000 mg | ORAL_CAPSULE | Freq: Every day | ORAL | Status: DC
Start: 2024-04-21 — End: 2024-04-27
  Administered 2024-04-22 – 2024-04-27 (×6): 0.4 mg via ORAL
  Filled 2024-04-21 (×6): qty 1

## 2024-04-21 NOTE — ED Notes (Signed)
 This RN and Damien RN attempted foley cath x2 unsuccessfully. EDP notified.

## 2024-04-21 NOTE — H&P (Addendum)
 History and Physical    Patient: Richard Frederick DOB: 11/25/1940 DOA: 04/21/2024 DOS: the patient was seen and examined on 04/21/2024 . PCP: Dwight Trula SQUIBB, MD  Patient coming from: SNF Chief complaint: Chief Complaint  Patient presents with   Fall   HPI:  Richard Frederick is a 83 y.o. male with past medical history  of diabetes mellitus type 2, essential hypertension, paroxysmal A-fib, dementia with behavioral disturbance Recently admitted on 10 September for similar presentation with fall patient also was confused, along with noted slurred speech and delayed response, patient at baseline noted to be alert and oriented to self.  Patient was discharged on 13 September with a discharge diagnosis of acute metabolic encephalopathy with worsening dementia presumed TIA. On initial presentation patient is atraumatic in appearance no bruises or lacerations to his head or extremity.  Patient was sitting in his wheelchair in the morning and was found lying on the ground on the left side at the nursing facility.  ED Course:  Vital signs in the ED were notable for the following:  Vitals:   04/21/24 1845 04/21/24 1853 04/21/24 1915 04/21/24 1930  BP: (!) 146/81 (!) 146/81 (!) 151/90 (!) 157/87  Pulse:  (!) 105 (!) 104   Temp:  98.8 F (37.1 C)    Resp: 20 20 19  (!) 22  SpO2: 98% 99% 100% 91%  TempSrc:  Axillary     >>ED evaluation thus far shows: EKG done today showed A-fib with heart rate of 100 QTc of 400, at bedside patient is in A-fib RVR consistently with heart rates in the 120s and 130s. Initial CMP showed glucose of 173 BUN of 62 AKI with a creatinine of 2.03 GFR of 32, anion gap of 16 abnormal LFTs with AST of 42 and an ALT of 61 magnesium  of 1.7. CBC showing leukocytosis of 25.4 hemoglobin of 15.7 and platelets of 346. CPK of 735. Lactic acid of 1.6 done at 1705 today.    >>While in the ED patient received the following: Medications  cefTRIAXone  (ROCEPHIN ) 1 g in  sodium chloride  0.9 % 100 mL IVPB (has no administration in time range)  sodium chloride  flush (NS) 0.9 % injection 3 mL (has no administration in time range)  lactated ringers  infusion (has no administration in time range)  acetaminophen  (TYLENOL ) tablet 650 mg (has no administration in time range)    Or  acetaminophen  (TYLENOL ) suppository 650 mg (has no administration in time range)  heparin  injection 5,000 Units (has no administration in time range)  hydrALAZINE  (APRESOLINE ) injection 5 mg (has no administration in time range)  metoprolol  tartrate (LOPRESSOR ) injection 5 mg (has no administration in time range)  diltiazem  (CARDIZEM ) 125 mg in dextrose  5% 125 mL (1 mg/mL) infusion (has no administration in time range)  potassium chloride  SA (KLOR-CON  M) CR tablet 40 mEq (40 mEq Oral Given 04/21/24 1231)  iohexol  (OMNIPAQUE ) 350 MG/ML injection 75 mL (75 mLs Intravenous Contrast Given 04/21/24 1230)  sodium chloride  0.9 % bolus 500 mL (0 mLs Intravenous Stopped 04/21/24 1428)  sodium chloride  0.9 % bolus 500 mL (0 mLs Intravenous Stopped 04/21/24 1648)  QUEtiapine  (SEROQUEL ) tablet 25 mg (25 mg Oral Given 04/21/24 1411)  haloperidol  lactate (HALDOL ) injection 2 mg (2 mg Intravenous Given 04/21/24 1428)  haloperidol  lactate (HALDOL ) injection 2 mg (2 mg Intravenous Given 04/21/24 1539)   Review of Systems  Unable to perform ROS: Mental status change   Past Medical History:  Diagnosis Date   A-fib (  HCC)    Anxiety    Dementia (HCC)    Hyperlipemia    Hypertension    Type II diabetes mellitus (HCC)    Vision disturbance    Past Surgical History:  Procedure Laterality Date   CATARACT EXTRACTION W/ INTRAOCULAR LENS IMPLANT Right ~ 2013   COLONOSCOPY W/ BIOPSIES AND POLYPECTOMY     COLONOSCOPY WITH PROPOFOL  N/A 04/10/2016   Procedure: COLONOSCOPY WITH PROPOFOL ;  Surgeon: Gladis MARLA Louder, MD;  Location: [facility];  Service: Endoscopy;  Laterality: N/A;   TRANSURETHRAL RESECTION OF  PROSTATE  early 2000s    reports that he has quit smoking. His smoking use included cigarettes. He has a 12 pack-year smoking history. He has quit using smokeless tobacco.  His smokeless tobacco use included chew. He reports that he does not drink alcohol and does not use drugs. Allergies  Allergen Reactions   Amlodipine Other (See Comments)    Edema    Ativan  [Lorazepam ] Other (See Comments)    Agitation, paradoxic reaction   Atorvastatin Other (See Comments)    Achy legs   Pravastatin Other (See Comments)    Achy legs   Rosuvastatin Other (See Comments)    Achy legs   Family History  Problem Relation Age of Onset   Breast cancer Sister    Heart disease Sister    Stroke Mother    Cancer Father        unsure of type   Prior to Admission medications   Medication Sig Start Date End Date Taking? Authorizing Provider  acetaminophen  (TYLENOL ) 325 MG tablet Take 650 mg by mouth every 4 (four) hours as needed for mild pain (pain score 1-3) or moderate pain (pain score 4-6).   Yes [provider]  bisacodyl (DULCOLAX) 10 MG suppository Place 10 mg rectally daily as needed for moderate constipation.   Yes [provider]  clopidogrel  (PLAVIX ) 75 MG tablet Take 1 tablet (75 mg total) by mouth daily. Please request future refills from PCP. 06/29/19  Yes Onita Duos, MD  donepezil  (ARICEPT ) 10 MG tablet Take 10 mg by mouth daily. 04/06/18  Yes [provider]  ezetimibe  (ZETIA ) 10 MG tablet Take 10 mg by mouth at bedtime. 06/18/18  Yes [provider]  lisinopril -hydrochlorothiazide  (PRINZIDE ,ZESTORETIC ) 20-12.5 MG tablet Take 1 tablet by mouth daily. 04/29/18  Yes [provider]  magnesium  hydroxide (MILK OF MAGNESIA) 400 MG/5ML suspension Take 30 mLs by mouth daily as needed for mild constipation or moderate constipation (no bm in 3 days).   Yes [provider]  melatonin 3 MG TABS tablet Take 2 tablets (6 mg total) by mouth at bedtime.  04/11/24  Yes Odell Celinda Balo, MD  memantine  (NAMENDA ) 10 MG tablet Take 10 mg by mouth 2 (two) times daily. 06/25/18  Yes [provider]  metFORMIN  (GLUCOPHAGE ) 500 MG tablet Take 500 mg by mouth daily with breakfast.   Yes [provider]  metoprolol  succinate (TOPROL -XL) 25 MG 24 hr tablet Take 25 mg by mouth daily.   Yes [provider]  Multiple Vitamins-Minerals (PRESERVISION AREDS 2) CAPS Take 2 capsules by mouth daily. 11/26/18  Yes [provider]  niacin  50 MG tablet Take 50 mg by mouth in the morning.   Yes [provider]  QUEtiapine  (SEROQUEL ) 25 MG tablet Take 1 tablet (25 mg total) by mouth at bedtime. 04/11/24  Yes Odell Celinda Balo, MD  sertraline  (ZOLOFT ) 50 MG tablet Take 50 mg by mouth daily. 06/25/18  Yes [provider]  Sodium Phosphates (ENEMA RE) Place 1 Dose rectally daily as needed (constipation).   Yes [provider]  tamsulosin  (FLOMAX ) 0.4 MG CAPS capsule Take 0.4 mg by mouth daily. 04/29/18  Yes [provider]                                                                                 Vitals:   04/21/24 1845 04/21/24 1853 04/21/24 1915 04/21/24 1930  BP: (!) 146/81 (!) 146/81 (!) 151/90 (!) 157/87  Pulse:  (!) 105 (!) 104   Resp: 20 20 19  (!) 22  Temp:  98.8 F (37.1 C)    TempSrc:  Axillary    SpO2: 98% 99% 100% 91%   Physical Exam Vitals reviewed.  Constitutional:      General: He is not in acute distress.    Appearance: He is not ill-appearing.  HENT:     Head: Normocephalic and atraumatic.     Mouth/Throat:     Mouth: Mucous membranes are dry.  Eyes:     Extraocular Movements: Extraocular movements intact.     Pupils: Pupils are equal, round, and reactive to light.  Cardiovascular:     Rate and Rhythm: Tachycardia present. Rhythm irregular.     Heart sounds: Normal heart sounds.  Pulmonary:     Breath sounds: Normal breath sounds.  Abdominal:     General: There  is no distension.     Palpations: Abdomen is soft.     Tenderness: There is no abdominal tenderness.  Neurological:     General: No focal deficit present.     Mental Status: He is disoriented.     Cranial Nerves: No facial asymmetry.     Deep Tendon Reflexes:     Reflex Scores:      Bicep reflexes are 1+ on the right side and 1+ on the left side.      Patellar reflexes are 1+ on the right side and 1+ on the left side.    Comments: Moving all 4 extremities spontaneously.  Patient is in soft restraints.  He has been restless for the past few hours in the ED with wife at bedside.  Unable to do a thorough neuroexam due to same.    Psychiatric:        Speech: He is noncommunicative.     Labs on Admission: I have personally reviewed following labs and imaging studies CBC: Recent Labs  Lab 04/21/24 1029 04/21/24 1142  WBC  --  25.4*  NEUTROABS  --  22.4*  HGB 9.2* 15.7  HCT 27.0* 48.1  MCV  --  86.8  PLT  --  346   Basic Metabolic Panel: Recent Labs  Lab 04/21/24 1029 04/21/24 1142 04/21/24 1642  NA 151* 143  --   K 2.0* 3.6  --   CL 120* 101  --   CO2  --  26  --   GLUCOSE 103* 173*  --   BUN 37* 62*  --   CREATININE 0.90 2.03*  --   CALCIUM  --  10.1  --   MG  --   --  1.7   GFR: Estimated  Creatinine Clearance: 27.7 mL/min (A) (by C-G formula based on SCr of 2.03 mg/dL (H)). Liver Function Tests: Recent Labs  Lab 04/21/24 1142  AST 42*  ALT 61*  ALKPHOS 77  BILITOT 1.1  PROT 7.2  ALBUMIN 3.3*   No results for input(s): LIPASE, AMYLASE in the last 168 hours. No results for input(s): AMMONIA in the last 168 hours. Recent Labs    01/13/24 1039 04/08/24 1620 04/12/24 1551 04/21/24 1029 04/21/24 1142  BUN 36* 20 19 37* 62*  CREATININE 1.51* 1.43* 1.19 0.90 2.03*    Cardiac Enzymes: Recent Labs  Lab 04/21/24 1142  CKTOTAL 735*   BNP (last 3 results) No results for input(s): PROBNP in the last 8760 hours. HbA1C: No results for input(s):  HGBA1C in the last 72 hours. CBG: Recent Labs  Lab 04/15/24 0854  GLUCAP 121*   Lipid Profile: No results for input(s): CHOL, HDL, LDLCALC, TRIG, CHOLHDL, LDLDIRECT in the last 72 hours. Thyroid Function Tests: Recent Labs    04/21/24 1641  TSH 4.203   Anemia Panel: No results for input(s): VITAMINB12, FOLATE, FERRITIN, TIBC, IRON, RETICCTPCT in the last 72 hours. Urine analysis:    Component Value Date/Time   COLORURINE YELLOW 04/21/2024 1310   APPEARANCEUR CLEAR 04/21/2024 1310   LABSPEC 1.015 04/21/2024 1310   PHURINE 5.0 04/21/2024 1310   GLUCOSEU NEGATIVE 04/21/2024 1310   HGBUR NEGATIVE 04/21/2024 1310   BILIRUBINUR NEGATIVE 04/21/2024 1310   KETONESUR NEGATIVE 04/21/2024 1310   PROTEINUR 30 (A) 04/21/2024 1310   NITRITE NEGATIVE 04/21/2024 1310   LEUKOCYTESUR TRACE (A) 04/21/2024 1310   Radiological Exams on Admission: CT T-SPINE NO CHARGE Result Date: 04/21/2024 CLINICAL DATA:  Status post fall.  Altered mental status. EXAM: CT Thoracic and Lumbar spine with contrast TECHNIQUE: Multiplanar CT images of the thoracic and lumbar spine were reconstructed from contemporary CT of the Chest, Abdomen, and Pelvis. RADIATION DOSE REDUCTION: This exam was performed according to the departmental dose-optimization program which includes automated exposure control, adjustment of the mA and/or kV according to patient size and/or use of iterative reconstruction technique. CONTRAST:  None or No additional COMPARISON:  None Available. FINDINGS: CT THORACIC SPINE FINDINGS Alignment: Normal. Vertebrae: There is diffuse osteopenia of the visualized osseous structures. No acute fracture or focal pathologic process. Paraspinal and other soft tissues: Negative. Disc levels: Intervertebral disc heights are maintained. Mild facet arthropathy marginal osteophyte formation. CT LUMBAR SPINE FINDINGS Segmentation: 5 lumbar type vertebrae. Alignment: Normal. Vertebrae: No acute  fracture or focal pathologic process. Paraspinal and other soft tissues: Negative. Disc levels: Intervertebral disc heights are maintained. Mild multilevel marginal osteophyte formation. IMPRESSION: *No acute traumatic injury to the thoracic or lumbar spine. Electronically Signed   By: Ree Molt M.D.   On: 04/21/2024 13:08   CT L-SPINE NO CHARGE Result Date: 04/21/2024 CLINICAL DATA:  Status post fall.  Altered mental status. EXAM: CT Thoracic and Lumbar spine with contrast TECHNIQUE: Multiplanar CT images of the thoracic and lumbar spine were reconstructed from contemporary CT of the Chest, Abdomen, and Pelvis. RADIATION DOSE REDUCTION: This exam was performed according to the departmental dose-optimization program which includes automated exposure control, adjustment of the mA and/or kV according to patient size and/or use of iterative reconstruction technique. CONTRAST:  None or No additional COMPARISON:  None Available. FINDINGS: CT THORACIC SPINE FINDINGS Alignment: Normal. Vertebrae: There is diffuse osteopenia of the visualized osseous structures. No acute fracture or focal pathologic process. Paraspinal and other soft tissues: Negative. Disc levels: Intervertebral  disc heights are maintained. Mild facet arthropathy marginal osteophyte formation. CT LUMBAR SPINE FINDINGS Segmentation: 5 lumbar type vertebrae. Alignment: Normal. Vertebrae: No acute fracture or focal pathologic process. Paraspinal and other soft tissues: Negative. Disc levels: Intervertebral disc heights are maintained. Mild multilevel marginal osteophyte formation. IMPRESSION: *No acute traumatic injury to the thoracic or lumbar spine. Electronically Signed   By: Ree Molt M.D.   On: 04/21/2024 13:08   CT Head Wo Contrast Result Date: 04/21/2024 CLINICAL DATA:  Head trauma, minor (Age >= 65y); Neck trauma (Age >= 65y). Status post fall. Altered mental status. EXAM: CT HEAD WITHOUT CONTRAST CT CERVICAL SPINE WITHOUT CONTRAST  TECHNIQUE: Multidetector CT imaging of the head and cervical spine was performed following the standard protocol without intravenous contrast. Multiplanar CT image reconstructions of the cervical spine were also generated. RADIATION DOSE REDUCTION: This exam was performed according to the departmental dose-optimization program which includes automated exposure control, adjustment of the mA and/or kV according to patient size and/or use of iterative reconstruction technique. COMPARISON:  CT scan head and cervical spine from 04/08/2024. FINDINGS: CT HEAD FINDINGS Brain: No evidence of acute infarction, hemorrhage, hydrocephalus, extra-axial collection or mass lesion/mass effect. There is bilateral periventricular hypodensity, which is non-specific but most likely seen in the settings of microvascular ischemic changes. Moderate in extent. Otherwise normal appearance of brain parenchyma. Ventricles are normal. Cerebral volume is age appropriate. Vascular: No hyperdense vessel or unexpected calcification. Intracranial arteriosclerosis. Skull: Normal. Negative for fracture or focal lesion. Sinuses/Orbits: No acute finding. Other: Visualized mastoid air cells are unremarkable. No mastoid effusion. CT CERVICAL SPINE FINDINGS Alignment: Mild/grade 1 retrolisthesis of C3 over C4 and mild/grade 1 anterolisthesis of C7 over T1, most likely degenerative in essentially similar to the prior study. This examination does not assess for ligamentous injury or stability. Skull base and vertebrae: No acute fracture. No primary bone lesion or focal pathologic process. Soft tissues and spinal canal: No prevertebral fluid or swelling. No visible canal hematoma. Disc levels: Mild multilevel changes characterized by reduced intervertebral disc height, facet arthropathy marginal osteophyte formation. Upper chest: Negative. Other: None. IMPRESSION: 1. No acute intracranial abnormality. 2. No acute osseous injury or traumatic listhesis of the  cervical spine. Electronically Signed   By: Ree Molt M.D.   On: 04/21/2024 13:03   CT Cervical Spine Wo Contrast Result Date: 04/21/2024 CLINICAL DATA:  Head trauma, minor (Age >= 65y); Neck trauma (Age >= 65y). Status post fall. Altered mental status. EXAM: CT HEAD WITHOUT CONTRAST CT CERVICAL SPINE WITHOUT CONTRAST TECHNIQUE: Multidetector CT imaging of the head and cervical spine was performed following the standard protocol without intravenous contrast. Multiplanar CT image reconstructions of the cervical spine were also generated. RADIATION DOSE REDUCTION: This exam was performed according to the departmental dose-optimization program which includes automated exposure control, adjustment of the mA and/or kV according to patient size and/or use of iterative reconstruction technique. COMPARISON:  CT scan head and cervical spine from 04/08/2024. FINDINGS: CT HEAD FINDINGS Brain: No evidence of acute infarction, hemorrhage, hydrocephalus, extra-axial collection or mass lesion/mass effect. There is bilateral periventricular hypodensity, which is non-specific but most likely seen in the settings of microvascular ischemic changes. Moderate in extent. Otherwise normal appearance of brain parenchyma. Ventricles are normal. Cerebral volume is age appropriate. Vascular: No hyperdense vessel or unexpected calcification. Intracranial arteriosclerosis. Skull: Normal. Negative for fracture or focal lesion. Sinuses/Orbits: No acute finding. Other: Visualized mastoid air cells are unremarkable. No mastoid effusion. CT CERVICAL SPINE FINDINGS Alignment: Mild/grade 1  retrolisthesis of C3 over C4 and mild/grade 1 anterolisthesis of C7 over T1, most likely degenerative in essentially similar to the prior study. This examination does not assess for ligamentous injury or stability. Skull base and vertebrae: No acute fracture. No primary bone lesion or focal pathologic process. Soft tissues and spinal canal: No prevertebral  fluid or swelling. No visible canal hematoma. Disc levels: Mild multilevel changes characterized by reduced intervertebral disc height, facet arthropathy marginal osteophyte formation. Upper chest: Negative. Other: None. IMPRESSION: 1. No acute intracranial abnormality. 2. No acute osseous injury or traumatic listhesis of the cervical spine. Electronically Signed   By: Ree Molt M.D.   On: 04/21/2024 13:03   CT CHEST ABDOMEN PELVIS W CONTRAST Result Date: 04/21/2024 CLINICAL DATA:  Polytrauma, blunt. Fall. Altered mental status. History of dementia. EXAM: CT CHEST, ABDOMEN, AND PELVIS WITH CONTRAST TECHNIQUE: Multidetector CT imaging of the chest, abdomen and pelvis was performed following the standard protocol during bolus administration of intravenous contrast. RADIATION DOSE REDUCTION: This exam was performed according to the departmental dose-optimization program which includes automated exposure control, adjustment of the mA and/or kV according to patient size and/or use of iterative reconstruction technique. CONTRAST:  75mL OMNIPAQUE  IOHEXOL  350 MG/ML SOLN COMPARISON:  None Available. FINDINGS: CT CHEST FINDINGS Cardiovascular: Normal cardiac size. No pericardial effusion. No aortic aneurysm. There are coronary artery calcifications, in keeping with coronary artery disease. There are also moderate peripheral atherosclerotic vascular calcifications of thoracic aorta and its major branches. Mediastinum/Nodes: Visualized thyroid gland appears grossly unremarkable. No solid / cystic mediastinal masses. The esophagus is nondistended precluding optimal assessment. No axillary, mediastinal or hilar lymphadenopathy by size criteria. Lungs/Pleura: The central tracheo-bronchial tree is patent. There is mild, smooth, circumferential thickening of the segmental and subsegmental bronchial walls, throughout bilateral lungs, which is nonspecific. Findings are most commonly seen with bronchitis or reactive airway  disease, such as asthma. There is mosaic attenuation of lungs, consistent with heterogeneous air trapping related to small airways disease. There are patchy areas of linear, plate-like atelectasis and/or scarring throughout bilateral lungs. No mass or consolidation. No pleural effusion or pneumothorax. No suspicious lung nodules. Musculoskeletal: The visualized soft tissues of the chest wall are grossly unremarkable. No suspicious osseous lesions. There are mild multilevel degenerative changes in the visualized spine. CT ABDOMEN PELVIS FINDINGS Hepatobiliary: The liver is normal in size. Non-cirrhotic configuration. No suspicious mass. There are 2, sub 5 mm, hypoattenuating foci in the left hepatic lobe, which are too small to adequately characterize. No intrahepatic or extrahepatic bile duct dilation. No calcified gallstones. Normal gallbladder wall thickness. No pericholecystic inflammatory changes. Pancreas: Unremarkable. No pancreatic ductal dilatation or surrounding inflammatory changes. Spleen: Within normal limits. No focal lesion. Adrenals/Urinary Tract: Adrenal glands are unremarkable. No suspicious renal mass. Bilateral mild hydronephrosis noted, right more than left. However, no hydroureter. Findings are likely due to distended bladder. No nephroureterolithiasis. Unremarkable urinary bladder. Stomach/Bowel: No disproportionate dilation of the small or large bowel loops. No evidence of abnormal bowel wall thickening or inflammatory changes. The appendix is unremarkable. There is a small diverticulum arising from the second part of duodenum. Vascular/Lymphatic: No ascites or pneumoperitoneum. No abdominal or pelvic lymphadenopathy, by size criteria. No aneurysmal dilation of the major abdominal arteries. There are moderate peripheral atherosclerotic vascular calcifications of the aorta and its major branches. Reproductive: Enlarged prostate. Symmetric seminal vesicles. Other: There are fat containing  umbilical and bilateral inguinal hernias. The soft tissues and abdominal wall are otherwise unremarkable. Musculoskeletal: No suspicious osseous lesions.  There are mild multilevel degenerative changes in the visualized spine. IMPRESSION: 1. No acute traumatic injury to the chest, abdomen or pelvis. 2. Multiple other nonacute observations, as described above. Electronically Signed   By: Ree Molt M.D.   On: 04/21/2024 12:57   DG Pelvis Portable Result Date: 04/21/2024 CLINICAL DATA:  Unwitnessed fall. EXAM: PORTABLE PELVIS 1-2 VIEWS COMPARISON:  April 09, 2024 FINDINGS: There is no evidence of an acute pelvic fracture or diastasis. No pelvic bone lesions are seen. IMPRESSION: No acute osseous abnormality. Electronically Signed   By: Suzen Dials M.D.   On: 04/21/2024 10:49   DG Chest Portable 1 View Result Date: 04/21/2024 CLINICAL DATA:  Unwitnessed fall. EXAM: PORTABLE CHEST 1 VIEW COMPARISON:  April 08, 2024 FINDINGS: The heart size and mediastinal contours are within normal limits. There is marked severity calcification of the thoracic aorta. Low lung volumes are noted with mild, stable elevation of the right hemidiaphragm. Mild, stable linear scarring and/or atelectasis is seen within the bilateral lung bases. No pleural effusion or pneumothorax is identified. No acute osseous abnormalities are identified. IMPRESSION: Low lung volumes with mild, stable bibasilar linear scarring and/or atelectasis. Electronically Signed   By: Suzen Dials M.D.   On: 04/21/2024 10:48   Data Reviewed: Relevant notes from primary care and specialist visits, past discharge summaries as available in EHR, including Care Everywhere . Prior diagnostic testing as pertinent to current admission diagnoses, Updated medications and problem lists for reconciliation .ED course, including vitals, labs, imaging, treatment and response to treatment,Triage notes, nursing and pharmacy notes and ED provider's  notes.Notable results as noted in HPI.Discussed case with EDMD/ ED APP/ or Specialty MD on call and as needed.  Assessment & Plan  >> Fall/ambulatory dysfunction: Suspect 2/2 to more of presyncope presentation. D/D include CVA/ TIA/ dysrhythmia.Fall precaution.  Gentle IV fluid hydration for the next 24 to 48 hours.  Will obtain an MRI of the brain noncontrast.  >> Altered mental status/dementia/delirium: Differentials include metabolic encephalopathy with acute kidney injury, uremia, possible TIA from his a.fib rvr. Neuro checks.    >> A-fib RVR: At bedside patient noted to be in A-fib RVR along with EKG started on diltiazem  gtt. also given 5 mg Lopressor  dose IV push. Low magnesium  1.7 corrected with 1 g of magnesium  IV.   >> Essential hypertension: Blood pressure fluctuation noted which could be from medication or A-fib RVR.  Currently blood pressure meds held to allow room for diltiazem  gtt. PTA meds include Metoprolol  25, lisinopril  HCTZ 20-12.5.  >> Diabetes mellitus type 2: Accu-Cheks every 4 hours with sliding scale every 4 hours.   >> AKI on CKD stage IIIb: Lab Results  Component Value Date   CREATININE 2.03 (H) 04/21/2024   CREATININE 0.90 04/21/2024   CREATININE 1.19 04/12/2024  AKI in combination of decreased p.o. intake rhabdo fall suspect prerenal from low blood pressures.  Currently will hold his blood pressure meds and continue with IV fluid hydration.  Indwelling Foley in place which yielded about 1850 mL urine.  Urology has been consulted. Bilateral mild hydronephrosis noted on CT is probably from his recent urinary retention will monitor urine output and appreciate urology consult.   >> Dementia with history of behavioral disturbance agitation: Continue Aricept  and Namenda  if patient passes a swallow evaluation. Patient at bedside is restless and will proceed with an MRI of the brain to rule out stroke.  >> Rhabdomyolysis: Secondary to fall.  Continue with  gentle IV fluid hydration.  Monitor  urine output and kidney function.   >> Hypomagnesemia: Magnesium  level of 1.7, 1 g replacement given.   >> Abnormal LFTs: Mild transaminitis noted secondary to hypoperfusion and dehydration.  Will follow trends and if needed right upper quadrant ultrasound.  DVT prophylaxis:  Heparin  every 12 hours.  Consults:  None  Advance Care Planning:    Code Status: Full Code   Family Communication:  Wife Disposition Plan:  To be determined Severity of Illness: The appropriate patient status for this patient is INPATIENT. Inpatient status is judged to be reasonable and necessary in order to provide the required intensity of service to ensure the patient's safety. The patient's presenting symptoms, physical exam findings, and initial radiographic and laboratory data in the context of their chronic comorbidities is felt to place them at high risk for further clinical deterioration. Furthermore, it is not anticipated that the patient will be medically stable for discharge from the hospital within 2 midnights of admission.   * I certify that at the point of admission it is my clinical judgment that the patient will require inpatient hospital care spanning beyond 2 midnights from the point of admission due to high intensity of service, high risk for further deterioration and high frequency of surveillance required.*  Unresulted Labs (From admission, onward)     Start     Ordered   04/22/24 0500  Comprehensive metabolic panel  Tomorrow morning,   R        04/21/24 1641   04/22/24 0500  CBC  Tomorrow morning,   R        04/21/24 1641   04/21/24 1349  Urine Culture  Once,   URGENT       Question:  Indication  Answer:  Bacteriuria screening (OB/GYN or Uro)   04/21/24 1349   04/21/24 1349  Blood culture (routine x 2)  BLOOD CULTURE X 2,   R      04/21/24 1349            Meds ordered this encounter  Medications   DISCONTD: metoprolol  succinate  (TOPROL -XL) 24 hr tablet 25 mg   DISCONTD: potassium chloride  10 mEq in 100 mL IVPB   potassium chloride  SA (KLOR-CON  M) CR tablet 40 mEq   iohexol  (OMNIPAQUE ) 350 MG/ML injection 75 mL   sodium chloride  0.9 % bolus 500 mL   cefTRIAXone  (ROCEPHIN ) 1 g in sodium chloride  0.9 % 100 mL IVPB    Antibiotic Indication::   UTI   sodium chloride  0.9 % bolus 500 mL   QUEtiapine  (SEROQUEL ) tablet 25 mg   haloperidol  lactate (HALDOL ) injection 2 mg   haloperidol  lactate (HALDOL ) injection 2 mg   sodium chloride  flush (NS) 0.9 % injection 3 mL   lactated ringers  infusion   OR Linked Order Group    acetaminophen  (TYLENOL ) tablet 650 mg    acetaminophen  (TYLENOL ) suppository 650 mg   heparin  injection 5,000 Units   hydrALAZINE  (APRESOLINE ) injection 5 mg   metoprolol  tartrate (LOPRESSOR ) injection 5 mg   diltiazem  (CARDIZEM ) 125 mg in dextrose  5% 125 mL (1 mg/mL) infusion   DISCONTD: lactated ringers  infusion   magnesium  sulfate IVPB 1 g 100 mL   donepezil  (ARICEPT ) tablet 10 mg   memantine  (NAMENDA ) tablet 10 mg   QUEtiapine  (SEROQUEL ) tablet 25 mg   cefTRIAXone  (ROCEPHIN ) 1 g in sodium chloride  0.9 % 100 mL IVPB    Antibiotic Indication::   UTI     Orders Placed This Encounter  Procedures   Urine  Culture   Blood culture (routine x 2)   CT Head Wo Contrast   CT Cervical Spine Wo Contrast   CT CHEST ABDOMEN PELVIS W CONTRAST   CT T-SPINE NO CHARGE   CT L-SPINE NO CHARGE   DG Chest Portable 1 View   DG Pelvis Portable   MR BRAIN WO CONTRAST   CBC with Differential   Comprehensive metabolic panel   CK   Urinalysis, Routine w reflex microscopic -Urine, Clean Catch   TSH   Comprehensive metabolic panel   CBC   Magnesium    Check temperature   Bladder scan   Insert foley catheter   Cardiac Monitoring Continuous x 24 hours Indications for use: Other; other indications for use: Rhabdomyolysis   Maintain IV access   Vital signs   Notify physician (specify)   Mobility Protocol: No  Restrictions   Refer to Sidebar Report Mobility Protocol for Adult Inpatient   Initiate Adult Central Line Maintenance and Catheter Protocol for patients with central line (CVC, PICC, Port, Hemodialysis, Trialysis)   Initiate CHG Protocol   Do not place and if present remove PureWick   Initiate Oral Care Protocol   Initiate Carrier Fluid Protocol   RN may order General Admission PRN Orders utilizing General Admission PRN medications (through manage orders) for the following patient needs: allergy symptoms (Claritin), cold sores (Carmex), cough (Robitussin DM), eye irritation (Liquifilm Tears), hemorrhoids (Tucks), indigestion (Maalox), minor skin irritation (Hydrocortisone Cream), muscle pain Lucienne Gay), nose irritation (saline nasal spray) and sore throat (Chloraseptic spray).   Ambulate with assistance   Swallow screen   Strict intake and output   Target HR: as below (usual 65-105) with return to baseline rhythm and rate with hemodynamic stability.   Notify physician (specify)   Cardiac monitoring while on diltiazem  infusion   Measure blood pressure q 15 min x 4, then q 30 min until at a stable dose or rhythm control achieved, then q 4 hr or more frequently to meet unit VS standard   Document cardiac rate & rhythm    document strip    Discontinue diltiazem  infusion immediately for HR < 60 BPM, SBP < , pauses > 2 seconds, or NEW AV block, or signs and symptoms of acute myocardial infarction   No bolus   Neuro checks   Swallow screen   Full code   Consult to hospitalist   Pulse oximetry check with vital signs   Oxygen therapy Mode or (Route): Nasal cannula; Liters Per Minute: 2; Keep O2 saturation between: greater than 92 %   I-stat chem 8, ED (not at [facility] [place], [facility] or [facility])   I-Stat CG4 Lactic Acid   I-Stat CG4 Lactic Acid   EKG 12-Lead   EKG 12-Lead   ED EKG   Admit to Inpatient (patient's expected length of stay will be greater than 2 midnights or inpatient only procedure)   Admit  to Inpatient (patient's expected length of stay will be greater than 2 midnights or inpatient only procedure)   Restraints non-violent   Aspiration precautions   Fall precautions    Author: Mario LULLA Blanch, MD 12 pm- 8 pm. [facility]. 04/21/2024 8:14 PM Please note for any communication after hours contact [facility] Assigned provider on call on Amion.

## 2024-04-21 NOTE — ED Provider Notes (Signed)
 [place] [facility]ERGENCY DEPARTMENT AT [facility] Provider Note   CSN: [id number] Arrival date & time: 04/21/24  9065     Patient presents with: Richard Frederick is a 83 y.o. male tree of paroxysmal A-fib, dementia, hyperlipidemia, hypertension, type 2 diabetes presents emerged from today for evaluation after fall.  Due to patient's dementia, history is limited.  Level 5 caveat.  EMS reported that the patient was found down on the ground.  I did call the [facility] and spoke with Jonette, LPN who provided the history for the patient.  Patient was put in his wheelchair around 0815 this morning.  Around 0830 when the patient was brought his breakfast tray he was found lying on the ground more on his left side with his head under the bed.  She reports that he was complaining of some left hip pain and lower back pain then.  Patient now reports no pain and reports that he feels fine.  He is oriented to person only, not place and time.  I have seen this patient previously and this is also his documented baseline.  He is no longer on blood thinners due to his frequent falls.  Does have clopidogrel  mentioned in his MAR with no morning medications given this morning, confirmed with LPN.   Fall       Prior to Admission medications   Medication Sig Start Date End Date Taking? Authorizing Provider  acetaminophen  (TYLENOL ) 325 MG tablet Take 650 mg by mouth every 4 (four) hours as needed for mild pain (pain score 1-3) or moderate pain (pain score 4-6).   Yes [provider]  bisacodyl (DULCOLAX) 10 MG suppository Place 10 mg rectally daily as needed for moderate constipation.   Yes [provider]  clopidogrel  (PLAVIX ) 75 MG tablet Take 1 tablet (75 mg total) by mouth daily. Please request future refills from PCP. 06/29/19  Yes Onita Duos, MD  donepezil  (ARICEPT ) 10 MG tablet Take 10 mg by mouth daily. 04/06/18  Yes [provider]  ezetimibe  (ZETIA ) 10 MG  tablet Take 10 mg by mouth at bedtime. 06/18/18  Yes [provider]  lisinopril -hydrochlorothiazide  (PRINZIDE ,ZESTORETIC ) 20-12.5 MG tablet Take 1 tablet by mouth daily. 04/29/18  Yes [provider]  magnesium  hydroxide (MILK OF MAGNESIA) 400 MG/5ML suspension Take 30 mLs by mouth daily as needed for mild constipation or moderate constipation (no bm in 3 days).   Yes [provider]  melatonin 3 MG TABS tablet Take 2 tablets (6 mg total) by mouth at bedtime. 04/11/24  Yes Odell Celinda Balo, MD  memantine  (NAMENDA ) 10 MG tablet Take 10 mg by mouth 2 (two) times daily. 06/25/18  Yes [provider]  metFORMIN  (GLUCOPHAGE ) 500 MG tablet Take 500 mg by mouth daily with breakfast.   Yes [provider]  metoprolol  succinate (TOPROL -XL) 25 MG 24 hr tablet Take 25 mg by mouth daily.   Yes [provider]  Multiple Vitamins-Minerals (PRESERVISION AREDS 2) CAPS Take 2 capsules by mouth daily. 11/26/18  Yes [provider]  niacin  50 MG tablet Take 50 mg by mouth in the morning.   Yes [provider]  QUEtiapine  (SEROQUEL ) 25 MG tablet Take 1 tablet (25 mg total) by mouth at bedtime. 04/11/24  Yes Odell Celinda Balo, MD  sertraline  (ZOLOFT ) 50 MG tablet Take 50 mg by mouth daily. 06/25/18  Yes [provider]  Sodium Phosphates (ENEMA RE) Place 1 Dose rectally daily as needed (constipation).  Yes [provider]  tamsulosin  (FLOMAX ) 0.4 MG CAPS capsule Take 0.4 mg by mouth daily. 04/29/18  Yes [provider]    Allergies: Amlodipine, Ativan  [lorazepam ], Atorvastatin, Pravastatin, and Rosuvastatin    Review of Systems  Unable to perform ROS: Dementia    Updated Vital Signs BP 110/76   Pulse (!) 120   Temp 97.9 F (36.6 C) (Rectal)   Resp (!) 23   SpO2 100%   Physical Exam Vitals and nursing note reviewed.  Constitutional:      Comments: Pleasantly demented  HENT:     Head: Normocephalic and  atraumatic.     Mouth/Throat:     Mouth: Mucous membranes are dry.  Eyes:     General: No scleral icterus.    Extraocular Movements: Extraocular movements intact.     Pupils: Pupils are equal, round, and reactive to light.  Cardiovascular:     Rate and Rhythm: Tachycardia present. Rhythm irregular.     Comments: Tachycardia irregular rate. Pulmonary:     Effort: Pulmonary effort is normal. No respiratory distress.  Chest:     Chest wall: No tenderness.  Abdominal:     Palpations: Abdomen is soft.     Tenderness: There is no abdominal tenderness. There is no guarding or rebound.     Comments: Nontender.  No signs of trauma noted.  Patient's abdomen does appear slightly distended.  Musculoskeletal:        General: No tenderness.     Comments: Does not appear to have any tenderness to palpation of the upper and lower extremities.  Moving all extremities spontaneously.  No signs of trauma noted.  Skin:    General: Skin is warm and dry.  Neurological:     Mental Status: He is alert.     Comments: Difficult to perform patient's neurological exam secondary to dementia.  Is oriented to person only.  Does move all extremities spontaneously.  No facial droop noted.     (all labs ordered are listed, but only abnormal results are displayed) Labs Reviewed  CBC WITH DIFFERENTIAL/PLATELET - Abnormal; Notable for the following components:      Result Value   WBC 25.4 (*)    Neutro Abs 22.4 (*)    Monocytes Absolute 1.5 (*)    Abs Immature Granulocytes 0.13 (*)    All other components within normal limits  COMPREHENSIVE METABOLIC PANEL WITH GFR - Abnormal; Notable for the following components:   Glucose, Bld 173 (*)    BUN 62 (*)    Creatinine, Ser 2.03 (*)    Albumin 3.3 (*)    AST 42 (*)    ALT 61 (*)    GFR, Estimated 32 (*)    Anion gap 16 (*)    All other components within normal limits  CK - Abnormal; Notable for the following components:   Total CK 735 (*)    All other  components within normal limits  URINALYSIS, ROUTINE W REFLEX MICROSCOPIC - Abnormal; Notable for the following components:   Protein, ur 30 (*)    Leukocytes,Ua TRACE (*)    Bacteria, UA RARE (*)    All other components within normal limits  I-STAT CHEM 8, ED - Abnormal; Notable for the following components:   Sodium 151 (*)    Potassium 2.0 (*)    Chloride 120 (*)    BUN 37 (*)    Glucose, Bld 103 (*)    Calcium, Ion 0.76 (*)    TCO2 15 (*)  Hemoglobin 9.2 (*)    HCT 27.0 (*)    All other components within normal limits  URINE CULTURE  CULTURE, BLOOD (ROUTINE X 2)  CULTURE, BLOOD (ROUTINE X 2)  I-STAT CG4 LACTIC ACID, ED  I-STAT CG4 LACTIC ACID, ED    EKG: EKG Interpretation Date/Time:  Tuesday April 21 2024 09:47:34 EDT Ventricular Rate:  100 PR Interval:    QRS Duration:  82 QT Interval:  310 QTC Calculation: 400 R Axis:   -29  Text Interpretation: Atrial fibrillation Inferior infarct, old Confirmed by Darra Chew [phone]) on 04/21/2024 9:49:06 AM  Radiology: CT T-SPINE NO CHARGE Result Date: 04/21/2024 CLINICAL DATA:  Status post fall.  Altered mental status. EXAM: CT Thoracic and Lumbar spine with contrast TECHNIQUE: Multiplanar CT images of the thoracic and lumbar spine were reconstructed from contemporary CT of the Chest, Abdomen, and Pelvis. RADIATION DOSE REDUCTION: This exam was performed according to the departmental dose-optimization program which includes automated exposure control, adjustment of the mA and/or kV according to patient size and/or use of iterative reconstruction technique. CONTRAST:  None or No additional COMPARISON:  None Available. FINDINGS: CT THORACIC SPINE FINDINGS Alignment: Normal. Vertebrae: There is diffuse osteopenia of the visualized osseous structures. No acute fracture or focal pathologic process. Paraspinal and other soft tissues: Negative. Disc levels: Intervertebral disc heights are maintained. Mild facet arthropathy marginal  osteophyte formation. CT LUMBAR SPINE FINDINGS Segmentation: 5 lumbar type vertebrae. Alignment: Normal. Vertebrae: No acute fracture or focal pathologic process. Paraspinal and other soft tissues: Negative. Disc levels: Intervertebral disc heights are maintained. Mild multilevel marginal osteophyte formation. IMPRESSION: *No acute traumatic injury to the thoracic or lumbar spine. Electronically Signed   By: Ree Molt M.D.   On: 04/21/2024 13:08   CT L-SPINE NO CHARGE Result Date: 04/21/2024 CLINICAL DATA:  Status post fall.  Altered mental status. EXAM: CT Thoracic and Lumbar spine with contrast TECHNIQUE: Multiplanar CT images of the thoracic and lumbar spine were reconstructed from contemporary CT of the Chest, Abdomen, and Pelvis. RADIATION DOSE REDUCTION: This exam was performed according to the departmental dose-optimization program which includes automated exposure control, adjustment of the mA and/or kV according to patient size and/or use of iterative reconstruction technique. CONTRAST:  None or No additional COMPARISON:  None Available. FINDINGS: CT THORACIC SPINE FINDINGS Alignment: Normal. Vertebrae: There is diffuse osteopenia of the visualized osseous structures. No acute fracture or focal pathologic process. Paraspinal and other soft tissues: Negative. Disc levels: Intervertebral disc heights are maintained. Mild facet arthropathy marginal osteophyte formation. CT LUMBAR SPINE FINDINGS Segmentation: 5 lumbar type vertebrae. Alignment: Normal. Vertebrae: No acute fracture or focal pathologic process. Paraspinal and other soft tissues: Negative. Disc levels: Intervertebral disc heights are maintained. Mild multilevel marginal osteophyte formation. IMPRESSION: *No acute traumatic injury to the thoracic or lumbar spine. Electronically Signed   By: Ree Molt M.D.   On: 04/21/2024 13:08   CT Head Wo Contrast Result Date: 04/21/2024 CLINICAL DATA:  Head trauma, minor (Age >= 65y); Neck trauma  (Age >= 65y). Status post fall. Altered mental status. EXAM: CT HEAD WITHOUT CONTRAST CT CERVICAL SPINE WITHOUT CONTRAST TECHNIQUE: Multidetector CT imaging of the head and cervical spine was performed following the standard protocol without intravenous contrast. Multiplanar CT image reconstructions of the cervical spine were also generated. RADIATION DOSE REDUCTION: This exam was performed according to the departmental dose-optimization program which includes automated exposure control, adjustment of the mA and/or kV according to patient size and/or use of iterative reconstruction technique.  COMPARISON:  CT scan head and cervical spine from 04/08/2024. FINDINGS: CT HEAD FINDINGS Brain: No evidence of acute infarction, hemorrhage, hydrocephalus, extra-axial collection or mass lesion/mass effect. There is bilateral periventricular hypodensity, which is non-specific but most likely seen in the settings of microvascular ischemic changes. Moderate in extent. Otherwise normal appearance of brain parenchyma. Ventricles are normal. Cerebral volume is age appropriate. Vascular: No hyperdense vessel or unexpected calcification. Intracranial arteriosclerosis. Skull: Normal. Negative for fracture or focal lesion. Sinuses/Orbits: No acute finding. Other: Visualized mastoid air cells are unremarkable. No mastoid effusion. CT CERVICAL SPINE FINDINGS Alignment: Mild/grade 1 retrolisthesis of C3 over C4 and mild/grade 1 anterolisthesis of C7 over T1, most likely degenerative in essentially similar to the prior study. This examination does not assess for ligamentous injury or stability. Skull base and vertebrae: No acute fracture. No primary bone lesion or focal pathologic process. Soft tissues and spinal canal: No prevertebral fluid or swelling. No visible canal hematoma. Disc levels: Mild multilevel changes characterized by reduced intervertebral disc height, facet arthropathy marginal osteophyte formation. Upper chest: Negative.  Other: None. IMPRESSION: 1. No acute intracranial abnormality. 2. No acute osseous injury or traumatic listhesis of the cervical spine. Electronically Signed   By: Ree Molt M.D.   On: 04/21/2024 13:03   CT Cervical Spine Wo Contrast Result Date: 04/21/2024 CLINICAL DATA:  Head trauma, minor (Age >= 65y); Neck trauma (Age >= 65y). Status post fall. Altered mental status. EXAM: CT HEAD WITHOUT CONTRAST CT CERVICAL SPINE WITHOUT CONTRAST TECHNIQUE: Multidetector CT imaging of the head and cervical spine was performed following the standard protocol without intravenous contrast. Multiplanar CT image reconstructions of the cervical spine were also generated. RADIATION DOSE REDUCTION: This exam was performed according to the departmental dose-optimization program which includes automated exposure control, adjustment of the mA and/or kV according to patient size and/or use of iterative reconstruction technique. COMPARISON:  CT scan head and cervical spine from 04/08/2024. FINDINGS: CT HEAD FINDINGS Brain: No evidence of acute infarction, hemorrhage, hydrocephalus, extra-axial collection or mass lesion/mass effect. There is bilateral periventricular hypodensity, which is non-specific but most likely seen in the settings of microvascular ischemic changes. Moderate in extent. Otherwise normal appearance of brain parenchyma. Ventricles are normal. Cerebral volume is age appropriate. Vascular: No hyperdense vessel or unexpected calcification. Intracranial arteriosclerosis. Skull: Normal. Negative for fracture or focal lesion. Sinuses/Orbits: No acute finding. Other: Visualized mastoid air cells are unremarkable. No mastoid effusion. CT CERVICAL SPINE FINDINGS Alignment: Mild/grade 1 retrolisthesis of C3 over C4 and mild/grade 1 anterolisthesis of C7 over T1, most likely degenerative in essentially similar to the prior study. This examination does not assess for ligamentous injury or stability. Skull base and vertebrae:  No acute fracture. No primary bone lesion or focal pathologic process. Soft tissues and spinal canal: No prevertebral fluid or swelling. No visible canal hematoma. Disc levels: Mild multilevel changes characterized by reduced intervertebral disc height, facet arthropathy marginal osteophyte formation. Upper chest: Negative. Other: None. IMPRESSION: 1. No acute intracranial abnormality. 2. No acute osseous injury or traumatic listhesis of the cervical spine. Electronically Signed   By: Ree Molt M.D.   On: 04/21/2024 13:03   CT CHEST ABDOMEN PELVIS W CONTRAST Result Date: 04/21/2024 CLINICAL DATA:  Polytrauma, blunt. Fall. Altered mental status. History of dementia. EXAM: CT CHEST, ABDOMEN, AND PELVIS WITH CONTRAST TECHNIQUE: Multidetector CT imaging of the chest, abdomen and pelvis was performed following the standard protocol during bolus administration of intravenous contrast. RADIATION DOSE REDUCTION: This exam was performed  according to the departmental dose-optimization program which includes automated exposure control, adjustment of the mA and/or kV according to patient size and/or use of iterative reconstruction technique. CONTRAST:  75mL OMNIPAQUE  IOHEXOL  350 MG/ML SOLN COMPARISON:  None Available. FINDINGS: CT CHEST FINDINGS Cardiovascular: Normal cardiac size. No pericardial effusion. No aortic aneurysm. There are coronary artery calcifications, in keeping with coronary artery disease. There are also moderate peripheral atherosclerotic vascular calcifications of thoracic aorta and its major branches. Mediastinum/Nodes: Visualized thyroid gland appears grossly unremarkable. No solid / cystic mediastinal masses. The esophagus is nondistended precluding optimal assessment. No axillary, mediastinal or hilar lymphadenopathy by size criteria. Lungs/Pleura: The central tracheo-bronchial tree is patent. There is mild, smooth, circumferential thickening of the segmental and subsegmental bronchial walls,  throughout bilateral lungs, which is nonspecific. Findings are most commonly seen with bronchitis or reactive airway disease, such as asthma. There is mosaic attenuation of lungs, consistent with heterogeneous air trapping related to small airways disease. There are patchy areas of linear, plate-like atelectasis and/or scarring throughout bilateral lungs. No mass or consolidation. No pleural effusion or pneumothorax. No suspicious lung nodules. Musculoskeletal: The visualized soft tissues of the chest wall are grossly unremarkable. No suspicious osseous lesions. There are mild multilevel degenerative changes in the visualized spine. CT ABDOMEN PELVIS FINDINGS Hepatobiliary: The liver is normal in size. Non-cirrhotic configuration. No suspicious mass. There are 2, sub 5 mm, hypoattenuating foci in the left hepatic lobe, which are too small to adequately characterize. No intrahepatic or extrahepatic bile duct dilation. No calcified gallstones. Normal gallbladder wall thickness. No pericholecystic inflammatory changes. Pancreas: Unremarkable. No pancreatic ductal dilatation or surrounding inflammatory changes. Spleen: Within normal limits. No focal lesion. Adrenals/Urinary Tract: Adrenal glands are unremarkable. No suspicious renal mass. Bilateral mild hydronephrosis noted, right more than left. However, no hydroureter. Findings are likely due to distended bladder. No nephroureterolithiasis. Unremarkable urinary bladder. Stomach/Bowel: No disproportionate dilation of the small or large bowel loops. No evidence of abnormal bowel wall thickening or inflammatory changes. The appendix is unremarkable. There is a small diverticulum arising from the second part of duodenum. Vascular/Lymphatic: No ascites or pneumoperitoneum. No abdominal or pelvic lymphadenopathy, by size criteria. No aneurysmal dilation of the major abdominal arteries. There are moderate peripheral atherosclerotic vascular calcifications of the aorta and  its major branches. Reproductive: Enlarged prostate. Symmetric seminal vesicles. Other: There are fat containing umbilical and bilateral inguinal hernias. The soft tissues and abdominal wall are otherwise unremarkable. Musculoskeletal: No suspicious osseous lesions. There are mild multilevel degenerative changes in the visualized spine. IMPRESSION: 1. No acute traumatic injury to the chest, abdomen or pelvis. 2. Multiple other nonacute observations, as described above. Electronically Signed   By: Ree Molt M.D.   On: 04/21/2024 12:57   DG Pelvis Portable Result Date: 04/21/2024 CLINICAL DATA:  Unwitnessed fall. EXAM: PORTABLE PELVIS 1-2 VIEWS COMPARISON:  April 09, 2024 FINDINGS: There is no evidence of an acute pelvic fracture or diastasis. No pelvic bone lesions are seen. IMPRESSION: No acute osseous abnormality. Electronically Signed   By: Suzen Dials M.D.   On: 04/21/2024 10:49   DG Chest Portable 1 View Result Date: 04/21/2024 CLINICAL DATA:  Unwitnessed fall. EXAM: PORTABLE CHEST 1 VIEW COMPARISON:  April 08, 2024 FINDINGS: The heart size and mediastinal contours are within normal limits. There is marked severity calcification of the thoracic aorta. Low lung volumes are noted with mild, stable elevation of the right hemidiaphragm. Mild, stable linear scarring and/or atelectasis is seen within the bilateral lung bases. No  pleural effusion or pneumothorax is identified. No acute osseous abnormalities are identified. IMPRESSION: Low lung volumes with mild, stable bibasilar linear scarring and/or atelectasis. Electronically Signed   By: Suzen Dials M.D.   On: 04/21/2024 10:48   Procedures   Medications Ordered in the ED  metoprolol  succinate (TOPROL -XL) 24 hr tablet 25 mg (25 mg Oral Given 04/21/24 1231)  cefTRIAXone  (ROCEPHIN ) 1 g in sodium chloride  0.9 % 100 mL IVPB (has no administration in time range)  potassium chloride  SA (KLOR-CON  M) CR tablet 40 mEq (40 mEq Oral Given  04/21/24 1231)  iohexol  (OMNIPAQUE ) 350 MG/ML injection 75 mL (75 mLs Intravenous Contrast Given 04/21/24 1230)  sodium chloride  0.9 % bolus 500 mL (0 mLs Intravenous Stopped 04/21/24 1428)  sodium chloride  0.9 % bolus 500 mL (500 mLs Intravenous New Bag/Given 04/21/24 1429)  QUEtiapine  (SEROQUEL ) tablet 25 mg (25 mg Oral Given 04/21/24 1411)  haloperidol  lactate (HALDOL ) injection 2 mg (2 mg Intravenous Given 04/21/24 1428)  haloperidol  lactate (HALDOL ) injection 2 mg (2 mg Intravenous Given 04/21/24 1539)    Clinical Course as of 04/21/24 1614  Tue Apr 21, 2024  0955 Was found on his left side with his head on the floor. Was put on his wheelchair around 0815 and went in to bring him his breakfast tray 0830 he was found on the ground. Beatris Rocks, Child psychotherapist.  [RR]  1409 Difficulty getting catheter in. Consulted urology.  [RR]  1425 Urology will assess the patient at bedside for cath placement [RR]    Clinical Course User Index [RR] Bernis Ernst, PA-C   Medical Decision Making Amount and/or Complexity of Data Reviewed Labs: ordered. Radiology: ordered. ECG/medicine tests: ordered.  Risk Prescription drug management. Decision regarding hospitalization.   83 y.o. male presents to the ER for evaluation of fall. Differential diagnosis includes but is not limited to trauma, MSK, contusion. Vital signs mild tachycardia in known afib, otherwise stable. Physical exam as noted above.   Patient does not appear in any acute distress.  When asked at bedside, he reports that he feels fine.  He is not having tenderness palpation throughout his body.  Does have slightly distended abdomen.  Given dementia, will order scan of CT head, C-spine, chest abdomen pelvis.  I have ordered portable x-ray of his pelvis and chest as well.  Does not have any tenderness on pelvis shake.  I independently reviewed and interpreted the patient's labs.  CBC shows leukocytosis to 25.4 with a left shift.  No anemia.  CMP  shows glucose of 173 with a BUN of 62 and a creatinine of 2.03.  Looks a patient's baseline is around 1.2.  Does have decreased albumin at 3.3.  Slightly elevated AST and ALT at 42 and 61.  Anion gap of 16.  I-STAT Chem-8 appears to be not accurate.  CK elevated at 735.  Urinalysis shows 30 protein with trace Mehta leukocytes.  Rare bacteria but no white blood nitrites present.  Urine culture ordered.  Lactic acid send to collected.  Blood cultures still may be collected as well.  Chest x-ray shows low lung volumes with mild, stable bibasilar linear scarring or atelectasis.  DG pelvis shows no acute osseous abnormality.  CT of the chest abdomen pelvis no acute traumatic injury to the chest, abdomen or pelvis. Multiple other nonacute observations, body of the text mentions that patient has some bilateral mild hydronephrosis with right more than left but no hydroureter likely due to distended bladder.  CT T and  L spine shows no acute traumatic injury to the thoracic or lumbar spine.  CT of his head and C-spine shows no acute intracranial malady without any acute osseous injury or traumatic lithiasis of the cervical spine.  Nursing reports that patient was able to urinate in brief and into the urinal.  In concern given this in the bladder and ordered bladder scan.  Bladder scan shows greater than at 1000 mL.  Unfortunately, nursing was thus as well as placing catheter.  I have consulted urology and spoke with Cam, NP who will evaluate the patient at bedside.  Patient is very agitated.  I have given him Seroquel  and Haldol  IV and is still agitated.  Likely due to pain from bladder spasm and distention.  I have ordered him another 2 of Haldol .  Cam was successful with placing a catheter.  Will formally consult with a note tomorrow.  Due to patient's mild AKI, presumed UTI, urinary retention, patient will need admission.  Unsure what leukocytosis is coming from, could be possible UTI or stress reaction from  fall and bladder distention.  May need further workup inpatient.  Admit to Dr. Tobie.  I discussed this case with my attending physician who cosigned this note including patient's presenting symptoms, physical exam, and planned diagnostics and interventions. Attending physician stated agreement with plan or made changes to plan which were implemented.   Portions of this report may have been transcribed using voice recognition software. Every effort was made to ensure accuracy; however, inadvertent computerized transcription errors may be present.    Final diagnoses:  Urinary retention  AKI (acute kidney injury)  Leukocytosis, unspecified type    ED Discharge Orders     None          Bernis Ernst, PA-C 04/21/24 1626    Long, Fonda MATSU, MD 04/30/24 [phone]

## 2024-04-21 NOTE — ED Triage Notes (Signed)
 Pt. BIB [facility] from [facility] for a fall; [facility] was unsure of when pt. [place]; LKW, and if he hit his head or not. Pt. Does not present with any lac to head or other extremities; Pt. Has a Hx of a-fib; Pt. Does not complain of any pain; pt. Denies any tingling or numbness in his extremities. VSS per [facility]

## 2024-04-21 NOTE — Procedures (Signed)
   Urology Procedure Note:  Patient is an 83 year old male presenting to [facility] emergency department after a fall.  During his workup CT A/P and subsequent bladder scan noted very distended bladder measuring over 1000 cc.  On my arrival patient was extremely confused and combative.  He had been provided with multiple sedatives without effect and ultimately required soft restraints.  With the aid of his nurse patient was prepped and draped in the usual sterile fashion.  He had a fair bit of phimosis but urethral meatus was able to be visualized without too much difficulty.  10 cc of sterile lubricant was injected directly into the urethra.  After this a 41f coud catheter was advanced into the level of the bladder without much difficulty.  There was immediate return of clear tan-colored urine.  Having confirmed placement, the retention balloon was inflated with 10 cc of sterile water.  StatLock was placed on the upper left thigh and catheter was secured.  Foley was placed to gravity drainage without dependent loops and he ultimately put out 1850 mL.  No blood was seen.  Catheter to stay in place for at least 7 days following distention injury and undifferentiated urinary retention following recent stroke.  Urology will formally consult tomorrow.  Ole Bourdon, NP [facility] Pager: [phone]

## 2024-04-21 NOTE — ED Notes (Signed)
 CCMD called.

## 2024-04-21 NOTE — ED Notes (Signed)
 Xray is at [facility]

## 2024-04-21 NOTE — ED Notes (Signed)
Unable to obtain blood cultures  

## 2024-04-21 NOTE — ED Notes (Signed)
 Pt is agitated and asking to go home. Wife is at bedside. Pt continually pulling off monitoring, redirection unsuccessful. EDP notified.

## 2024-04-22 ENCOUNTER — Other Ambulatory Visit: Payer: Self-pay

## 2024-04-22 ENCOUNTER — Inpatient Hospital Stay (HOSPITAL_COMMUNITY)

## 2024-04-22 ENCOUNTER — Encounter (HOSPITAL_COMMUNITY): Payer: Self-pay | Admitting: Internal Medicine

## 2024-04-22 DIAGNOSIS — I10 Essential (primary) hypertension: Secondary | ICD-10-CM | POA: Diagnosis not present

## 2024-04-22 DIAGNOSIS — W19XXXD Unspecified fall, subsequent encounter: Secondary | ICD-10-CM | POA: Diagnosis not present

## 2024-04-22 DIAGNOSIS — R9082 White matter disease, unspecified: Secondary | ICD-10-CM | POA: Diagnosis not present

## 2024-04-22 DIAGNOSIS — G319 Degenerative disease of nervous system, unspecified: Secondary | ICD-10-CM | POA: Diagnosis not present

## 2024-04-22 DIAGNOSIS — R4182 Altered mental status, unspecified: Secondary | ICD-10-CM | POA: Diagnosis not present

## 2024-04-22 LAB — COMPREHENSIVE METABOLIC PANEL WITH GFR
ALT: 50 U/L — ABNORMAL HIGH (ref 0–44)
AST: 35 U/L (ref 15–41)
Albumin: 2.5 g/dL — ABNORMAL LOW (ref 3.5–5.0)
Alkaline Phosphatase: 66 U/L (ref 38–126)
Anion gap: 12 (ref 5–15)
BUN: 43 mg/dL — ABNORMAL HIGH (ref 8–23)
CO2: 27 mmol/L (ref 22–32)
Calcium: 9.1 mg/dL (ref 8.9–10.3)
Chloride: 105 mmol/L (ref 98–111)
Creatinine, Ser: 1.55 mg/dL — ABNORMAL HIGH (ref 0.61–1.24)
GFR, Estimated: 44 mL/min — ABNORMAL LOW (ref >60)
Glucose, Bld: 148 mg/dL — ABNORMAL HIGH (ref 70–99)
Potassium: 3.8 mmol/L (ref 3.5–5.1)
Sodium: 144 mmol/L (ref 135–145)
Total Bilirubin: 1 mg/dL (ref 0.0–1.2)
Total Protein: 5.7 g/dL — ABNORMAL LOW (ref 6.5–8.1)

## 2024-04-22 LAB — CBC
HCT: 42.1 % (ref 39.0–52.0)
Hemoglobin: 13.7 g/dL (ref 13.0–17.0)
MCH: 28.5 pg (ref 26.0–34.0)
MCHC: 32.5 g/dL (ref 30.0–36.0)
MCV: 87.7 fL (ref 80.0–100.0)
Platelets: 323 K/uL (ref 150–400)
RBC: 4.8 MIL/uL (ref 4.22–5.81)
RDW: 13.1 % (ref 11.5–15.5)
WBC: 21.4 K/uL — ABNORMAL HIGH (ref 4.0–10.5)
nRBC: 0 % (ref 0.0–0.2)

## 2024-04-22 MED ORDER — QUETIAPINE FUMARATE 25 MG PO TABS
12.5000 mg | ORAL_TABLET | Freq: Every day | ORAL | Status: DC
  Administered 2024-04-22: 12.5 mg via ORAL
  Filled 2024-04-22 (×2): qty 1

## 2024-04-22 MED ORDER — HALOPERIDOL LACTATE 5 MG/ML IJ SOLN: 5.0000 mg | Freq: Four times a day (QID) | INTRAMUSCULAR | Status: DC | PRN

## 2024-04-22 MED ORDER — HALOPERIDOL LACTATE 5 MG/ML IJ SOLN
2.0000 mg | Freq: Once | INTRAMUSCULAR | Status: AC | PRN
  Administered 2024-04-22: 2 mg via INTRAVENOUS
  Filled 2024-04-22: qty 1

## 2024-04-22 MED ORDER — SODIUM CHLORIDE 0.9 % IV SOLN
3.0000 g | Freq: Three times a day (TID) | INTRAVENOUS | Status: DC
  Administered 2024-04-22 – 2024-04-24 (×5): 3 g via INTRAVENOUS
  Filled 2024-04-22 (×6): qty 8

## 2024-04-22 MED ORDER — DILTIAZEM HCL ER 90 MG PO CP12
90.0000 mg | ORAL_CAPSULE | Freq: Two times a day (BID) | ORAL | Status: DC
  Administered 2024-04-22 – 2024-04-23 (×2): 90 mg via ORAL
  Filled 2024-04-22 (×5): qty 1

## 2024-04-22 MED ORDER — HALOPERIDOL LACTATE 5 MG/ML IJ SOLN
2.0000 mg | Freq: Four times a day (QID) | INTRAMUSCULAR | Status: DC | PRN
  Administered 2024-04-22: 2 mg via INTRAVENOUS
  Filled 2024-04-22: qty 1

## 2024-04-22 MED ORDER — CHLORHEXIDINE GLUCONATE CLOTH 2 % EX PADS
6.0000 | MEDICATED_PAD | Freq: Every day | CUTANEOUS | Status: DC
  Administered 2024-04-22 – 2024-04-27 (×6): 6 via TOPICAL

## 2024-04-22 NOTE — Consult Note (Signed)
 Urology Consult Note   Requesting Attending Physician:  Trixie Nilda HERO, MD Service Providing Consult: Urology  Consulting Attending: Dr. Alvaro   Reason for Consult:  Urinary Retention  HPI: Richard Frederick is seen in consultation for reasons noted above at the request of Trixie Nilda HERO, MD. Patient is a 83 y.o. male presenting to [facility] emergency department after a fall at his facility.  He was found down and brought in by EMS.  On arrival he was extremely confused and combative.  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  He was recently discharged on September 13 following a stay for acute metabolic encephalopathy and worsening dementia secondary to presumed TIA.  He was accompanied by his wife at bedside he was attempting to de-escalate the patient's agitation.  He ultimately required wrist and ankle restraints.  Foley catheter was placed due to severe urinary retention.  See separate procedure note. he reports that this is a relatively new finding.  Patient was seen remotely by Dr. McDiarmid for urinary frequency but was released to follow-up on an as-needed basis.  He was last seen in 2018. ------------------ Assessment:   83 y.o. male with urinary retention  Recommendations: # Urinary retention # BPH  # AKI  Foley catheter placed. 1850 mL returned.  No hematuria thankfully.  This is going to be a struggle to maintain. I feel that if he were out of restraints he would probably rip his catheter out immediately.  He has done so with his IVs and telemetry.  That being said, his level of urinary retention was severe and he needs time for his bladder to heal.  He may require a Foley in perpetuity, as he does not appear to be a surgical candidate.  Trend labs, monitor for post obstructive diuresis.   If medically reasonable, start Flomax  daily. Though it was quite a struggle getting him to take a few pills earlier today. He  is able to swallow, but is severely disoriented and forgets what is in his mouth, trying to spit it out.  Urology will follow along peripherally for the time being.   Case and plan discussed with Dr. Alvaro  Past Medical History: Past Medical History:  Diagnosis Date   A-fib (HCC)    Anxiety    Dementia (HCC)    Hyperlipemia    Hypertension    Type II diabetes mellitus (HCC)    Vision disturbance     Past Surgical History:  Past Surgical History:  Procedure Laterality Date   CATARACT EXTRACTION W/ INTRAOCULAR LENS IMPLANT Right ~ 2013   COLONOSCOPY W/ BIOPSIES AND POLYPECTOMY     COLONOSCOPY WITH PROPOFOL  N/A 04/10/2016   Procedure: COLONOSCOPY WITH PROPOFOL ;  Surgeon: Gladis MARLA Louder, MD;  Location: [facility];  Service: Endoscopy;  Laterality: N/A;   TRANSURETHRAL RESECTION OF PROSTATE  early 2000s    Medication: Current Facility-Administered Medications  Medication Dose Route Frequency Provider Last Rate Last Admin   acetaminophen  (TYLENOL ) tablet 650 mg  650 mg Oral Q6H PRN Patel, Ekta V, MD       Or   acetaminophen  (TYLENOL ) suppository 650 mg  650 mg Rectal Q6H PRN Patel, Ekta V, MD       Ampicillin -Sulbactam (UNASYN ) 3 g in sodium chloride  0.9 % 100 mL IVPB  3 g Intravenous Q8H Gherghe, Costin M, MD       diltiazem  (CARDIZEM  SR) 12 hr capsule 90 mg  90 mg  Oral Q12H Gherghe, Costin M, MD   90 mg at 04/22/24 1455   diltiazem  (CARDIZEM ) 125 mg in dextrose  5% 125 mL (1 mg/mL) infusion  5-10 mg/hr Intravenous Titrated Gherghe, Costin M, MD 12.5 mL/hr at 04/22/24 1359 12.5 mg/hr at 04/22/24 1359   donepezil  (ARICEPT ) tablet 10 mg  10 mg Oral Daily Patel, Ekta V, MD   10 mg at 04/22/24 1215   haloperidol  lactate (HALDOL ) injection 5 mg  5 mg Intravenous Q6H PRN Gherghe, Costin M, MD       heparin  injection 5,000 Units  5,000 Units Subcutaneous Q8H Tobie Mario GAILS, MD   5,000 Units at 04/22/24 9472   hydrALAZINE  (APRESOLINE ) injection 5 mg  5 mg Intravenous Q4H PRN Tobie Mario GAILS, MD       lactated ringers  infusion   Intravenous Continuous Tobie Mario GAILS, MD 75 mL/hr at 04/22/24 1214 New Bag at 04/22/24 1214   memantine  (NAMENDA ) tablet 10 mg  10 mg Oral BID Patel, Ekta V, MD   10 mg at 04/22/24 1215   QUEtiapine  (SEROQUEL ) tablet 12.5 mg  12.5 mg Oral Daily Gherghe, Costin M, MD   12.5 mg at 04/22/24 1455   QUEtiapine  (SEROQUEL ) tablet 25 mg  25 mg Oral QHS Patel, Ekta V, MD       sertraline  (ZOLOFT ) tablet 50 mg  50 mg Oral Daily Patel, Ekta V, MD   50 mg at 04/22/24 1215   sodium chloride  flush (NS) 0.9 % injection 3 mL  3 mL Intravenous Q12H Tobie Mario V, MD   3 mL at 04/22/24 1215   tamsulosin  (FLOMAX ) capsule 0.4 mg  0.4 mg Oral Daily Patel, Ekta V, MD   0.4 mg at 04/22/24 1215    Allergies: Allergies  Allergen Reactions   Amlodipine Other (See Comments)    Edema    Ativan  [Lorazepam ] Other (See Comments)    Agitation, paradoxic reaction   Atorvastatin Other (See Comments)    Achy legs   Pravastatin Other (See Comments)    Achy legs   Rosuvastatin Other (See Comments)    Achy legs    Social History: Social History   Tobacco Use   Smoking status: Former    Current packs/day: 1.00    Average packs/day: 1 pack/day for 12.0 years (12.0 ttl pk-yrs)    Types: Cigarettes   Smokeless tobacco: Former    Types: Chew   Tobacco comments:    quit smoking in the 1970s; quit chewing ~ 2007  Substance Use Topics   Alcohol use: No   Drug use: No    Family History Family History  Problem Relation Age of Onset   Breast cancer Sister    Heart disease Sister    Stroke Mother    Cancer Father        unsure of type    Review of Systems  Unable to perform ROS: Dementia     Objective   Vital signs in last 24 hours: BP 127/79 (BP Location: Right Arm)   Pulse (!) 112   Temp 98.2 F (36.8 C) (Oral)   Resp 16   SpO2 (!) 87%   Physical Exam General: A&O, resting, appropriate HEENT: [place]/AT Pulmonary: Normal work of breathing Cardiovascular: no  cyanosis Abdomen: Soft, NTTP, nondistended GU: foley in place draining clear yellow urine   Most Recent Labs: Lab Results  Component Value Date   WBC 21.4 (H) 04/22/2024   HGB 13.7 04/22/2024   HCT 42.1 04/22/2024   PLT  323 04/22/2024    Lab Results  Component Value Date   NA 144 04/22/2024   K 3.8 04/22/2024   CL 105 04/22/2024   CO2 27 04/22/2024   BUN 43 (H) 04/22/2024   CREATININE 1.55 (H) 04/22/2024   CALCIUM 9.1 04/22/2024   MG 1.7 04/21/2024   PHOS 3.5 04/08/2024    Lab Results  Component Value Date   INR 1.0 04/08/2024   APTT 31 04/08/2024     Urine Culture: @[id number] (laburin,org,[id number])@   IMAGING: MR BRAIN WO CONTRAST Result Date: 04/22/2024 EXAM: MRI BRAIN WITHOUT CONTRAST 04/22/2024 10:12:41 AM TECHNIQUE: Multiplanar multisequence MRI of the head/brain was performed without the administration of intravenous contrast. COMPARISON: CT head without contrast 04/21/2024. MR head without contrast 06/23/2018. CLINICAL HISTORY: AMS/ Suspect TIA. Ams dementia. FINDINGS: BRAIN AND VENTRICLES: Moderate atrophy and white matter disease has progressed since the prior exam. Mild white matter changes extend into the brain stem. No acute infarct. No intracranial hemorrhage. No mass. No midline shift. No hydrocephalus. The sella is unremarkable. Normal flow voids. ORBITS: No acute abnormality. SINUSES AND MASTOIDS: No acute abnormality. BONES AND SOFT TISSUES: Normal marrow signal. No acute soft tissue abnormality. IMPRESSION: 1. No acute intracranial abnormality. 2. Moderate atrophy and white matter disease, progressed since the prior exam, with mild white matter changes extending into the brain stem. This most likely reflects the sequelae of chronic microvascular ischemia. Electronically signed by: Lonni Necessary MD 04/22/2024 10:21 AM EDT RP Workstation: [id number]   CT T-SPINE NO CHARGE Result Date: 04/21/2024 CLINICAL DATA:  Status post fall.  Altered mental  status. EXAM: CT Thoracic and Lumbar spine with contrast TECHNIQUE: Multiplanar CT images of the thoracic and lumbar spine were reconstructed from contemporary CT of the Chest, Abdomen, and Pelvis. RADIATION DOSE REDUCTION: This exam was performed according to the departmental dose-optimization program which includes automated exposure control, adjustment of the mA and/or kV according to patient size and/or use of iterative reconstruction technique. CONTRAST:  None or No additional COMPARISON:  None Available. FINDINGS: CT THORACIC SPINE FINDINGS Alignment: Normal. Vertebrae: There is diffuse osteopenia of the visualized osseous structures. No acute fracture or focal pathologic process. Paraspinal and other soft tissues: Negative. Disc levels: Intervertebral disc heights are maintained. Mild facet arthropathy marginal osteophyte formation. CT LUMBAR SPINE FINDINGS Segmentation: 5 lumbar type vertebrae. Alignment: Normal. Vertebrae: No acute fracture or focal pathologic process. Paraspinal and other soft tissues: Negative. Disc levels: Intervertebral disc heights are maintained. Mild multilevel marginal osteophyte formation. IMPRESSION: *No acute traumatic injury to the thoracic or lumbar spine. Electronically Signed   By: Ree Molt M.D.   On: 04/21/2024 13:08   CT L-SPINE NO CHARGE Result Date: 04/21/2024 CLINICAL DATA:  Status post fall.  Altered mental status. EXAM: CT Thoracic and Lumbar spine with contrast TECHNIQUE: Multiplanar CT images of the thoracic and lumbar spine were reconstructed from contemporary CT of the Chest, Abdomen, and Pelvis. RADIATION DOSE REDUCTION: This exam was performed according to the departmental dose-optimization program which includes automated exposure control, adjustment of the mA and/or kV according to patient size and/or use of iterative reconstruction technique. CONTRAST:  None or No additional COMPARISON:  None Available. FINDINGS: CT THORACIC SPINE FINDINGS Alignment:  Normal. Vertebrae: There is diffuse osteopenia of the visualized osseous structures. No acute fracture or focal pathologic process. Paraspinal and other soft tissues: Negative. Disc levels: Intervertebral disc heights are maintained. Mild facet arthropathy marginal osteophyte formation. CT LUMBAR SPINE FINDINGS Segmentation: 5 lumbar type vertebrae. Alignment: Normal. Vertebrae: No  acute fracture or focal pathologic process. Paraspinal and other soft tissues: Negative. Disc levels: Intervertebral disc heights are maintained. Mild multilevel marginal osteophyte formation. IMPRESSION: *No acute traumatic injury to the thoracic or lumbar spine. Electronically Signed   By: Ree Molt M.D.   On: 04/21/2024 13:08   CT Head Wo Contrast Result Date: 04/21/2024 CLINICAL DATA:  Head trauma, minor (Age >= 65y); Neck trauma (Age >= 65y). Status post fall. Altered mental status. EXAM: CT HEAD WITHOUT CONTRAST CT CERVICAL SPINE WITHOUT CONTRAST TECHNIQUE: Multidetector CT imaging of the head and cervical spine was performed following the standard protocol without intravenous contrast. Multiplanar CT image reconstructions of the cervical spine were also generated. RADIATION DOSE REDUCTION: This exam was performed according to the departmental dose-optimization program which includes automated exposure control, adjustment of the mA and/or kV according to patient size and/or use of iterative reconstruction technique. COMPARISON:  CT scan head and cervical spine from 04/08/2024. FINDINGS: CT HEAD FINDINGS Brain: No evidence of acute infarction, hemorrhage, hydrocephalus, extra-axial collection or mass lesion/mass effect. There is bilateral periventricular hypodensity, which is non-specific but most likely seen in the settings of microvascular ischemic changes. Moderate in extent. Otherwise normal appearance of brain parenchyma. Ventricles are normal. Cerebral volume is age appropriate. Vascular: No hyperdense vessel or  unexpected calcification. Intracranial arteriosclerosis. Skull: Normal. Negative for fracture or focal lesion. Sinuses/Orbits: No acute finding. Other: Visualized mastoid air cells are unremarkable. No mastoid effusion. CT CERVICAL SPINE FINDINGS Alignment: Mild/grade 1 retrolisthesis of C3 over C4 and mild/grade 1 anterolisthesis of C7 over T1, most likely degenerative in essentially similar to the prior study. This examination does not assess for ligamentous injury or stability. Skull base and vertebrae: No acute fracture. No primary bone lesion or focal pathologic process. Soft tissues and spinal canal: No prevertebral fluid or swelling. No visible canal hematoma. Disc levels: Mild multilevel changes characterized by reduced intervertebral disc height, facet arthropathy marginal osteophyte formation. Upper chest: Negative. Other: None. IMPRESSION: 1. No acute intracranial abnormality. 2. No acute osseous injury or traumatic listhesis of the cervical spine. Electronically Signed   By: Ree Molt M.D.   On: 04/21/2024 13:03   CT Cervical Spine Wo Contrast Result Date: 04/21/2024 CLINICAL DATA:  Head trauma, minor (Age >= 65y); Neck trauma (Age >= 65y). Status post fall. Altered mental status. EXAM: CT HEAD WITHOUT CONTRAST CT CERVICAL SPINE WITHOUT CONTRAST TECHNIQUE: Multidetector CT imaging of the head and cervical spine was performed following the standard protocol without intravenous contrast. Multiplanar CT image reconstructions of the cervical spine were also generated. RADIATION DOSE REDUCTION: This exam was performed according to the departmental dose-optimization program which includes automated exposure control, adjustment of the mA and/or kV according to patient size and/or use of iterative reconstruction technique. COMPARISON:  CT scan head and cervical spine from 04/08/2024. FINDINGS: CT HEAD FINDINGS Brain: No evidence of acute infarction, hemorrhage, hydrocephalus, extra-axial collection or  mass lesion/mass effect. There is bilateral periventricular hypodensity, which is non-specific but most likely seen in the settings of microvascular ischemic changes. Moderate in extent. Otherwise normal appearance of brain parenchyma. Ventricles are normal. Cerebral volume is age appropriate. Vascular: No hyperdense vessel or unexpected calcification. Intracranial arteriosclerosis. Skull: Normal. Negative for fracture or focal lesion. Sinuses/Orbits: No acute finding. Other: Visualized mastoid air cells are unremarkable. No mastoid effusion. CT CERVICAL SPINE FINDINGS Alignment: Mild/grade 1 retrolisthesis of C3 over C4 and mild/grade 1 anterolisthesis of C7 over T1, most likely degenerative in essentially similar to the prior study.  This examination does not assess for ligamentous injury or stability. Skull base and vertebrae: No acute fracture. No primary bone lesion or focal pathologic process. Soft tissues and spinal canal: No prevertebral fluid or swelling. No visible canal hematoma. Disc levels: Mild multilevel changes characterized by reduced intervertebral disc height, facet arthropathy marginal osteophyte formation. Upper chest: Negative. Other: None. IMPRESSION: 1. No acute intracranial abnormality. 2. No acute osseous injury or traumatic listhesis of the cervical spine. Electronically Signed   By: Ree Molt M.D.   On: 04/21/2024 13:03   CT CHEST ABDOMEN PELVIS W CONTRAST Result Date: 04/21/2024 CLINICAL DATA:  Polytrauma, blunt. Fall. Altered mental status. History of dementia. EXAM: CT CHEST, ABDOMEN, AND PELVIS WITH CONTRAST TECHNIQUE: Multidetector CT imaging of the chest, abdomen and pelvis was performed following the standard protocol during bolus administration of intravenous contrast. RADIATION DOSE REDUCTION: This exam was performed according to the departmental dose-optimization program which includes automated exposure control, adjustment of the mA and/or kV according to patient size  and/or use of iterative reconstruction technique. CONTRAST:  75mL OMNIPAQUE  IOHEXOL  350 MG/ML SOLN COMPARISON:  None Available. FINDINGS: CT CHEST FINDINGS Cardiovascular: Normal cardiac size. No pericardial effusion. No aortic aneurysm. There are coronary artery calcifications, in keeping with coronary artery disease. There are also moderate peripheral atherosclerotic vascular calcifications of thoracic aorta and its major branches. Mediastinum/Nodes: Visualized thyroid gland appears grossly unremarkable. No solid / cystic mediastinal masses. The esophagus is nondistended precluding optimal assessment. No axillary, mediastinal or hilar lymphadenopathy by size criteria. Lungs/Pleura: The central tracheo-bronchial tree is patent. There is mild, smooth, circumferential thickening of the segmental and subsegmental bronchial walls, throughout bilateral lungs, which is nonspecific. Findings are most commonly seen with bronchitis or reactive airway disease, such as asthma. There is mosaic attenuation of lungs, consistent with heterogeneous air trapping related to small airways disease. There are patchy areas of linear, plate-like atelectasis and/or scarring throughout bilateral lungs. No mass or consolidation. No pleural effusion or pneumothorax. No suspicious lung nodules. Musculoskeletal: The visualized soft tissues of the chest wall are grossly unremarkable. No suspicious osseous lesions. There are mild multilevel degenerative changes in the visualized spine. CT ABDOMEN PELVIS FINDINGS Hepatobiliary: The liver is normal in size. Non-cirrhotic configuration. No suspicious mass. There are 2, sub 5 mm, hypoattenuating foci in the left hepatic lobe, which are too small to adequately characterize. No intrahepatic or extrahepatic bile duct dilation. No calcified gallstones. Normal gallbladder wall thickness. No pericholecystic inflammatory changes. Pancreas: Unremarkable. No pancreatic ductal dilatation or surrounding  inflammatory changes. Spleen: Within normal limits. No focal lesion. Adrenals/Urinary Tract: Adrenal glands are unremarkable. No suspicious renal mass. Bilateral mild hydronephrosis noted, right more than left. However, no hydroureter. Findings are likely due to distended bladder. No nephroureterolithiasis. Unremarkable urinary bladder. Stomach/Bowel: No disproportionate dilation of the small or large bowel loops. No evidence of abnormal bowel wall thickening or inflammatory changes. The appendix is unremarkable. There is a small diverticulum arising from the second part of duodenum. Vascular/Lymphatic: No ascites or pneumoperitoneum. No abdominal or pelvic lymphadenopathy, by size criteria. No aneurysmal dilation of the major abdominal arteries. There are moderate peripheral atherosclerotic vascular calcifications of the aorta and its major branches. Reproductive: Enlarged prostate. Symmetric seminal vesicles. Other: There are fat containing umbilical and bilateral inguinal hernias. The soft tissues and abdominal wall are otherwise unremarkable. Musculoskeletal: No suspicious osseous lesions. There are mild multilevel degenerative changes in the visualized spine. IMPRESSION: 1. No acute traumatic injury to the chest, abdomen or pelvis. 2. Multiple  other nonacute observations, as described above. Electronically Signed   By: Ree Molt M.D.   On: 04/21/2024 12:57   DG Pelvis Portable Result Date: 04/21/2024 CLINICAL DATA:  Unwitnessed fall. EXAM: PORTABLE PELVIS 1-2 VIEWS COMPARISON:  April 09, 2024 FINDINGS: There is no evidence of an acute pelvic fracture or diastasis. No pelvic bone lesions are seen. IMPRESSION: No acute osseous abnormality. Electronically Signed   By: Suzen Dials M.D.   On: 04/21/2024 10:49   DG Chest Portable 1 View Result Date: 04/21/2024 CLINICAL DATA:  Unwitnessed fall. EXAM: PORTABLE CHEST 1 VIEW COMPARISON:  April 08, 2024 FINDINGS: The heart size and mediastinal  contours are within normal limits. There is marked severity calcification of the thoracic aorta. Low lung volumes are noted with mild, stable elevation of the right hemidiaphragm. Mild, stable linear scarring and/or atelectasis is seen within the bilateral lung bases. No pleural effusion or pneumothorax is identified. No acute osseous abnormalities are identified. IMPRESSION: Low lung volumes with mild, stable bibasilar linear scarring and/or atelectasis. Electronically Signed   By: Suzen Dials M.D.   On: 04/21/2024 10:48   ------  Ole Bourdon, NP Pager: [phone]   Please contact the urology consult pager with any further questions/concerns.

## 2024-04-22 NOTE — Progress Notes (Addendum)
 PROGRESS NOTE  KAISER BELLUOMINI FMW:[id number] DOB: Oct 22, 1940 DOA: 04/21/2024 PCP: Dwight Trula SQUIBB, MD   LOS: 1 day   Brief Narrative / Interim history:  83 y.o. male with past medical history  of diabetes mellitus type 2, essential hypertension, paroxysmal A-fib, dementia with behavioral disturbance Recently admitted on 10 September for similar presentation with fall patient also was confused, along with noted slurred speech and delayed response, patient at baseline noted to be alert and oriented to self.  Patient was discharged on 13 September, was in his nursing home, and the morning of admission was found lying on the ground on the left side at the nursing facility  Subjective / 24h Interval events: Very confused this morning, in restraints, mumbling  Assesement and Plan: Principal problem Acute metabolic encephalopathy, underlying advanced dementia, possible syncope -patient admitted to the hospital with a fall.  I feel like he may have been dehydrated given creatinine elevation, possible underlying infection with a significant leukocytosis.  Continue IV fluids - MRI of the brain without acute findings, but it did show atrophy and white matter disease, progressed since prior exam  Active problems A-fib with RVR -he was noted on admission, has been started on diltiazem  infusion.  Continue for now until he has consistent p.o. intake and will transition to oral agents.  He is a very poor candidate for coagulation given history of falls, ambulatory dysfunction  Leukocytosis-white count was found to be 25.4 on admission, improved to 21 this morning.  He was given ceftriaxone  for presumed urinary source, however urinalysis not that concerning for UTI.  Given dementia I am more concerned about silent aspiration, or some nonspecific findings on the CT scan of the chest, start Unasyn   Acute kidney injury -creatinine on presentation 2.0 (had a value of 0.9 before, but the whole BMP looks off).   Likely due to dehydration/poor p.o. intake and also acute urinary retention.  Continue fluids, creatinine improving  Essential hypertension-continue diltiazem  infusion for now  Advanced dementia-continue donepezil , memantine , Zoloft   Acute urinary retention, BPH-urology saw patient and had to have a Foley catheter due to acute retention.  Urinalysis not that impressive or too concerning for UTI.  Pending cultures  DM 2-A1c in the 5 range.  Mild rhabdo-on fluids  GOC - discussed with the wife this afternoon, he has advanced dementia, poor quality of life, refusing care intermittently. Code status changed to DNR  Scheduled Meds:  donepezil   10 mg Oral Daily   heparin   5,000 Units Subcutaneous Q8H   memantine   10 mg Oral BID   QUEtiapine   25 mg Oral QHS   sertraline   50 mg Oral Daily   sodium chloride  flush  3 mL Intravenous Q12H   tamsulosin   0.4 mg Oral Daily   Continuous Infusions:  ampicillin -sulbactam (UNASYN ) IV     diltiazem  (CARDIZEM ) infusion 10 mg/hr (04/22/24 0325)   lactated ringers  75 mL/hr at 04/22/24 0303   PRN Meds:.acetaminophen  **OR** acetaminophen , hydrALAZINE   Current Outpatient Medications  Medication Instructions   acetaminophen  (TYLENOL ) 650 mg, Oral, Every 4 hours PRN   bisacodyl (DULCOLAX) 10 mg, Rectal, Daily PRN   clopidogrel  (PLAVIX ) 75 mg, Oral, Daily, Please request future refills from PCP.   donepezil  (ARICEPT ) 10 mg, Oral, Daily   ezetimibe  (ZETIA ) 10 mg, Oral, Daily at bedtime   lisinopril -hydrochlorothiazide  (PRINZIDE ,ZESTORETIC ) 20-12.5 MG tablet 1 tablet, Oral, Daily   magnesium  hydroxide (MILK OF MAGNESIA) 400 MG/5ML suspension 30 mLs, Oral, Daily PRN   melatonin 6 mg, Oral, Daily  at bedtime   memantine  (NAMENDA ) 10 mg, Oral, 2 times daily   metFORMIN  (GLUCOPHAGE ) 500 mg, Oral, Daily with breakfast   metoprolol  succinate (TOPROL -XL) 25 mg, Oral, Daily   Multiple Vitamins-Minerals (PRESERVISION AREDS 2) CAPS 2 capsules, Oral, Daily   niacin   50 mg, Oral, Every morning   QUEtiapine  (SEROQUEL ) 25 mg, Oral, Daily at bedtime   sertraline  (ZOLOFT ) 50 mg, Oral, Daily   Sodium Phosphates (ENEMA RE) 1 Dose, Rectal, Daily PRN   tamsulosin  (FLOMAX ) 0.4 mg, Oral, Daily    Diet Orders (From admission, onward)     Start     Ordered   04/22/24 1054  Diet regular Fluid consistency: Thin  Diet effective now       Question:  Fluid consistency:  Answer:  Thin   04/22/24 1053            DVT prophylaxis: heparin  injection 5,000 Units Start: 04/21/24 2200   Lab Results  Component Value Date   PLT 323 04/22/2024      Code Status: Full Code  Family Communication: No family at bedside  Status is: Inpatient Remains inpatient appropriate because: Severity of illness  Level of care: Progressive  Consultants:  Urology  Objective: Vitals:   04/22/24 0430 04/22/24 0500 04/22/24 0515 04/22/24 0807  BP: (!) 154/83 (!) 165/80 139/82 (!) 148/81  Pulse: 97 89 96 91  Resp: 20   (!) 92  Temp: 98.8 F (37.1 C)   98.8 F (37.1 C)  TempSrc: Axillary   Axillary  SpO2: 94% 97% 98% 95%    Intake/Output Summary (Last 24 hours) at 04/22/2024 1054 Last data filed at 04/22/2024 0325 Gross per 24 hour  Intake 910.69 ml  Output 1850 ml  Net -939.31 ml   Wt Readings from Last 3 Encounters:  04/12/24 81.6 kg  04/09/24 82.1 kg  03/10/24 82.1 kg    Examination:  Constitutional: NAD Eyes: no scleral icterus ENMT: Mucous membranes are moist.  Neck: normal, supple Respiratory: clear to auscultation bilaterally, no wheezing, no crackles. Normal respiratory effort.  Cardiovascular: Regular rate and rhythm, no murmurs / rubs / gallops. No LE edema.  Abdomen: non distended, no tenderness. Bowel sounds positive.  Musculoskeletal: no clubbing / cyanosis.   Data Reviewed: I have independently reviewed following labs and imaging studies   CBC Recent Labs  Lab 04/21/24 1029 04/21/24 1142 04/22/24 0439  WBC  --  25.4* 21.4*  HGB 9.2*  15.7 13.7  HCT 27.0* 48.1 42.1  PLT  --  346 323  MCV  --  86.8 87.7  MCH  --  28.3 28.5  MCHC  --  32.6 32.5  RDW  --  13.2 13.1  LYMPHSABS  --  1.1  --   MONOABS  --  1.5*  --   EOSABS  --  0.3  --   BASOSABS  --  0.1  --     Recent Labs  Lab 04/21/24 1029 04/21/24 1142 04/21/24 1641 04/21/24 1642 04/21/24 1705 04/21/24 1928 04/22/24 0439  NA 151* 143  --   --   --   --  144  K 2.0* 3.6  --   --   --   --  3.8  CL 120* 101  --   --   --   --  105  CO2  --  26  --   --   --   --  27  GLUCOSE 103* 173*  --   --   --   --  148*  BUN 37* 62*  --   --   --   --  43*  CREATININE 0.90 2.03*  --   --   --   --  1.55*  CALCIUM  --  10.1  --   --   --   --  9.1  AST  --  42*  --   --   --   --  35  ALT  --  61*  --   --   --   --  50*  ALKPHOS  --  77  --   --   --   --  66  BILITOT  --  1.1  --   --   --   --  1.0  ALBUMIN  --  3.3*  --   --   --   --  2.5*  MG  --   --   --  1.7  --   --   --   LATICACIDVEN  --   --   --   --  1.6 1.5  --   TSH  --   --  4.203  --   --   --   --     ------------------------------------------------------------------------------------------------------------------ No results for input(s): CHOL, HDL, LDLCALC, TRIG, CHOLHDL, LDLDIRECT in the last 72 hours.  Lab Results  Component Value Date   HGBA1C 5.4 04/08/2024   ------------------------------------------------------------------------------------------------------------------ Recent Labs    04/21/24 1641  TSH 4.203    Cardiac Enzymes No results for input(s): CKMB, TROPONINI, MYOGLOBIN in the last 168 hours.  Invalid input(s): CK ------------------------------------------------------------------------------------------------------------------    Component Value Date/Time   BNP 32.2 01/13/2024 1039    CBG: No results for input(s): GLUCAP in the last 168 hours.  Recent Results (from the past 240 hours)  Blood culture (routine x 2)     Status: None  (Preliminary result)   Collection Time: 04/21/24  1:49 PM   Specimen: BLOOD LEFT HAND  Result Value Ref Range Status   Specimen Description BLOOD LEFT HAND  Final   Special Requests   Final    BOTTLES DRAWN AEROBIC AND ANAEROBIC Blood Culture results may not be optimal due to an inadequate volume of blood received in culture bottles   Culture   Final    NO GROWTH < 12 HOURS Performed at [facility], [address] [place], [place] [address]    Report Status PENDING  Incomplete     Radiology Studies: MR BRAIN WO CONTRAST Result Date: 04/22/2024 EXAM: MRI BRAIN WITHOUT CONTRAST 04/22/2024 10:12:41 AM TECHNIQUE: Multiplanar multisequence MRI of the head/brain was performed without the administration of intravenous contrast. COMPARISON: CT head without contrast 04/21/2024. MR head without contrast 06/23/2018. CLINICAL HISTORY: AMS/ Suspect TIA. Ams dementia. FINDINGS: BRAIN AND VENTRICLES: Moderate atrophy and white matter disease has progressed since the prior exam. Mild white matter changes extend into the brain stem. No acute infarct. No intracranial hemorrhage. No mass. No midline shift. No hydrocephalus. The sella is unremarkable. Normal flow voids. ORBITS: No acute abnormality. SINUSES AND MASTOIDS: No acute abnormality. BONES AND SOFT TISSUES: Normal marrow signal. No acute soft tissue abnormality. IMPRESSION: 1. No acute intracranial abnormality. 2. Moderate atrophy and white matter disease, progressed since the prior exam, with mild white matter changes extending into the brain stem. This most likely reflects the sequelae of chronic microvascular ischemia. Electronically signed by: Lonni Necessary MD 04/22/2024 10:21 AM EDT RP Workstation: [id number]   CT T-SPINE NO CHARGE  Result Date: 04/21/2024 CLINICAL DATA:  Status post fall.  Altered mental status. EXAM: CT Thoracic and Lumbar spine with contrast TECHNIQUE: Multiplanar CT images of the thoracic and lumbar spine were  reconstructed from contemporary CT of the Chest, Abdomen, and Pelvis. RADIATION DOSE REDUCTION: This exam was performed according to the departmental dose-optimization program which includes automated exposure control, adjustment of the mA and/or kV according to patient size and/or use of iterative reconstruction technique. CONTRAST:  None or No additional COMPARISON:  None Available. FINDINGS: CT THORACIC SPINE FINDINGS Alignment: Normal. Vertebrae: There is diffuse osteopenia of the visualized osseous structures. No acute fracture or focal pathologic process. Paraspinal and other soft tissues: Negative. Disc levels: Intervertebral disc heights are maintained. Mild facet arthropathy marginal osteophyte formation. CT LUMBAR SPINE FINDINGS Segmentation: 5 lumbar type vertebrae. Alignment: Normal. Vertebrae: No acute fracture or focal pathologic process. Paraspinal and other soft tissues: Negative. Disc levels: Intervertebral disc heights are maintained. Mild multilevel marginal osteophyte formation. IMPRESSION: *No acute traumatic injury to the thoracic or lumbar spine. Electronically Signed   By: Ree Molt M.D.   On: 04/21/2024 13:08   CT L-SPINE NO CHARGE Result Date: 04/21/2024 CLINICAL DATA:  Status post fall.  Altered mental status. EXAM: CT Thoracic and Lumbar spine with contrast TECHNIQUE: Multiplanar CT images of the thoracic and lumbar spine were reconstructed from contemporary CT of the Chest, Abdomen, and Pelvis. RADIATION DOSE REDUCTION: This exam was performed according to the departmental dose-optimization program which includes automated exposure control, adjustment of the mA and/or kV according to patient size and/or use of iterative reconstruction technique. CONTRAST:  None or No additional COMPARISON:  None Available. FINDINGS: CT THORACIC SPINE FINDINGS Alignment: Normal. Vertebrae: There is diffuse osteopenia of the visualized osseous structures. No acute fracture or focal pathologic  process. Paraspinal and other soft tissues: Negative. Disc levels: Intervertebral disc heights are maintained. Mild facet arthropathy marginal osteophyte formation. CT LUMBAR SPINE FINDINGS Segmentation: 5 lumbar type vertebrae. Alignment: Normal. Vertebrae: No acute fracture or focal pathologic process. Paraspinal and other soft tissues: Negative. Disc levels: Intervertebral disc heights are maintained. Mild multilevel marginal osteophyte formation. IMPRESSION: *No acute traumatic injury to the thoracic or lumbar spine. Electronically Signed   By: Ree Molt M.D.   On: 04/21/2024 13:08   CT Head Wo Contrast Result Date: 04/21/2024 CLINICAL DATA:  Head trauma, minor (Age >= 65y); Neck trauma (Age >= 65y). Status post fall. Altered mental status. EXAM: CT HEAD WITHOUT CONTRAST CT CERVICAL SPINE WITHOUT CONTRAST TECHNIQUE: Multidetector CT imaging of the head and cervical spine was performed following the standard protocol without intravenous contrast. Multiplanar CT image reconstructions of the cervical spine were also generated. RADIATION DOSE REDUCTION: This exam was performed according to the departmental dose-optimization program which includes automated exposure control, adjustment of the mA and/or kV according to patient size and/or use of iterative reconstruction technique. COMPARISON:  CT scan head and cervical spine from 04/08/2024. FINDINGS: CT HEAD FINDINGS Brain: No evidence of acute infarction, hemorrhage, hydrocephalus, extra-axial collection or mass lesion/mass effect. There is bilateral periventricular hypodensity, which is non-specific but most likely seen in the settings of microvascular ischemic changes. Moderate in extent. Otherwise normal appearance of brain parenchyma. Ventricles are normal. Cerebral volume is age appropriate. Vascular: No hyperdense vessel or unexpected calcification. Intracranial arteriosclerosis. Skull: Normal. Negative for fracture or focal lesion. Sinuses/Orbits: No  acute finding. Other: Visualized mastoid air cells are unremarkable. No mastoid effusion. CT CERVICAL SPINE FINDINGS Alignment: Mild/grade 1 retrolisthesis of C3 over  C4 and mild/grade 1 anterolisthesis of C7 over T1, most likely degenerative in essentially similar to the prior study. This examination does not assess for ligamentous injury or stability. Skull base and vertebrae: No acute fracture. No primary bone lesion or focal pathologic process. Soft tissues and spinal canal: No prevertebral fluid or swelling. No visible canal hematoma. Disc levels: Mild multilevel changes characterized by reduced intervertebral disc height, facet arthropathy marginal osteophyte formation. Upper chest: Negative. Other: None. IMPRESSION: 1. No acute intracranial abnormality. 2. No acute osseous injury or traumatic listhesis of the cervical spine. Electronically Signed   By: Ree Molt M.D.   On: 04/21/2024 13:03   CT Cervical Spine Wo Contrast Result Date: 04/21/2024 CLINICAL DATA:  Head trauma, minor (Age >= 65y); Neck trauma (Age >= 65y). Status post fall. Altered mental status. EXAM: CT HEAD WITHOUT CONTRAST CT CERVICAL SPINE WITHOUT CONTRAST TECHNIQUE: Multidetector CT imaging of the head and cervical spine was performed following the standard protocol without intravenous contrast. Multiplanar CT image reconstructions of the cervical spine were also generated. RADIATION DOSE REDUCTION: This exam was performed according to the departmental dose-optimization program which includes automated exposure control, adjustment of the mA and/or kV according to patient size and/or use of iterative reconstruction technique. COMPARISON:  CT scan head and cervical spine from 04/08/2024. FINDINGS: CT HEAD FINDINGS Brain: No evidence of acute infarction, hemorrhage, hydrocephalus, extra-axial collection or mass lesion/mass effect. There is bilateral periventricular hypodensity, which is non-specific but most likely seen in the settings  of microvascular ischemic changes. Moderate in extent. Otherwise normal appearance of brain parenchyma. Ventricles are normal. Cerebral volume is age appropriate. Vascular: No hyperdense vessel or unexpected calcification. Intracranial arteriosclerosis. Skull: Normal. Negative for fracture or focal lesion. Sinuses/Orbits: No acute finding. Other: Visualized mastoid air cells are unremarkable. No mastoid effusion. CT CERVICAL SPINE FINDINGS Alignment: Mild/grade 1 retrolisthesis of C3 over C4 and mild/grade 1 anterolisthesis of C7 over T1, most likely degenerative in essentially similar to the prior study. This examination does not assess for ligamentous injury or stability. Skull base and vertebrae: No acute fracture. No primary bone lesion or focal pathologic process. Soft tissues and spinal canal: No prevertebral fluid or swelling. No visible canal hematoma. Disc levels: Mild multilevel changes characterized by reduced intervertebral disc height, facet arthropathy marginal osteophyte formation. Upper chest: Negative. Other: None. IMPRESSION: 1. No acute intracranial abnormality. 2. No acute osseous injury or traumatic listhesis of the cervical spine. Electronically Signed   By: Ree Molt M.D.   On: 04/21/2024 13:03   CT CHEST ABDOMEN PELVIS W CONTRAST Result Date: 04/21/2024 CLINICAL DATA:  Polytrauma, blunt. Fall. Altered mental status. History of dementia. EXAM: CT CHEST, ABDOMEN, AND PELVIS WITH CONTRAST TECHNIQUE: Multidetector CT imaging of the chest, abdomen and pelvis was performed following the standard protocol during bolus administration of intravenous contrast. RADIATION DOSE REDUCTION: This exam was performed according to the departmental dose-optimization program which includes automated exposure control, adjustment of the mA and/or kV according to patient size and/or use of iterative reconstruction technique. CONTRAST:  75mL OMNIPAQUE  IOHEXOL  350 MG/ML SOLN COMPARISON:  None Available.  FINDINGS: CT CHEST FINDINGS Cardiovascular: Normal cardiac size. No pericardial effusion. No aortic aneurysm. There are coronary artery calcifications, in keeping with coronary artery disease. There are also moderate peripheral atherosclerotic vascular calcifications of thoracic aorta and its major branches. Mediastinum/Nodes: Visualized thyroid gland appears grossly unremarkable. No solid / cystic mediastinal masses. The esophagus is nondistended precluding optimal assessment. No axillary, mediastinal or hilar lymphadenopathy by  size criteria. Lungs/Pleura: The central tracheo-bronchial tree is patent. There is mild, smooth, circumferential thickening of the segmental and subsegmental bronchial walls, throughout bilateral lungs, which is nonspecific. Findings are most commonly seen with bronchitis or reactive airway disease, such as asthma. There is mosaic attenuation of lungs, consistent with heterogeneous air trapping related to small airways disease. There are patchy areas of linear, plate-like atelectasis and/or scarring throughout bilateral lungs. No mass or consolidation. No pleural effusion or pneumothorax. No suspicious lung nodules. Musculoskeletal: The visualized soft tissues of the chest wall are grossly unremarkable. No suspicious osseous lesions. There are mild multilevel degenerative changes in the visualized spine. CT ABDOMEN PELVIS FINDINGS Hepatobiliary: The liver is normal in size. Non-cirrhotic configuration. No suspicious mass. There are 2, sub 5 mm, hypoattenuating foci in the left hepatic lobe, which are too small to adequately characterize. No intrahepatic or extrahepatic bile duct dilation. No calcified gallstones. Normal gallbladder wall thickness. No pericholecystic inflammatory changes. Pancreas: Unremarkable. No pancreatic ductal dilatation or surrounding inflammatory changes. Spleen: Within normal limits. No focal lesion. Adrenals/Urinary Tract: Adrenal glands are unremarkable. No  suspicious renal mass. Bilateral mild hydronephrosis noted, right more than left. However, no hydroureter. Findings are likely due to distended bladder. No nephroureterolithiasis. Unremarkable urinary bladder. Stomach/Bowel: No disproportionate dilation of the small or large bowel loops. No evidence of abnormal bowel wall thickening or inflammatory changes. The appendix is unremarkable. There is a small diverticulum arising from the second part of duodenum. Vascular/Lymphatic: No ascites or pneumoperitoneum. No abdominal or pelvic lymphadenopathy, by size criteria. No aneurysmal dilation of the major abdominal arteries. There are moderate peripheral atherosclerotic vascular calcifications of the aorta and its major branches. Reproductive: Enlarged prostate. Symmetric seminal vesicles. Other: There are fat containing umbilical and bilateral inguinal hernias. The soft tissues and abdominal wall are otherwise unremarkable. Musculoskeletal: No suspicious osseous lesions. There are mild multilevel degenerative changes in the visualized spine. IMPRESSION: 1. No acute traumatic injury to the chest, abdomen or pelvis. 2. Multiple other nonacute observations, as described above. Electronically Signed   By: Ree Molt M.D.   On: 04/21/2024 12:57     Nilda Fendt, MD, PhD [facility]  Between 7 am - 7 pm I am available, please contact me via Amion (for emergencies) or Securechat (non urgent messages)  Between 7 pm - 7 am I am not available, please contact night coverage MD/APP via Amion

## 2024-04-22 NOTE — ED Notes (Signed)
 PT OTF with swat RN for MRI.

## 2024-04-22 NOTE — TOC Initial Note (Signed)
 Transition of Care [facility]) - Initial/Assessment Note    Patient Details  Name: Richard Frederick MRN: [id number] Date of Birth: [birth year]  [facility] CM/SW Contact:    Marval Gell, RN Phone Number: 04/22/2024, 3:20 PM  Clinical Narrative:                  Patient brought to hospital from [facility] after fall.  Currently hospitalized with syncope, confusion, in restraints.  TOC will continue to follow for dispo planning.   Expected Discharge Plan: Skilled Nursing Facility Barriers to Discharge: Continued Medical Work up   Patient Goals and CMS Choice            Expected Discharge Plan and Services                                              Prior Living Arrangements/Services                       Activities of Daily Living      Permission Sought/Granted                  Emotional Assessment              Admission diagnosis:  Urinary retention [R33.9] Fall [W19.XXXA] AKI (acute kidney injury) [N17.9] Leukocytosis, unspecified type [D72.829] Patient Active Problem List   Diagnosis Date Noted   Fall 04/21/2024   Encephalopathy acute 04/09/2024   Dementia with behavioral disturbance (HCC) 04/08/2024   Paroxysmal A-fib (HCC) 04/08/2024   Memory loss 07/04/2018   VI nerve palsy, 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

## 2024-04-23 ENCOUNTER — Inpatient Hospital Stay (HOSPITAL_COMMUNITY)

## 2024-04-23 DIAGNOSIS — Z515 Encounter for palliative care: Secondary | ICD-10-CM

## 2024-04-23 DIAGNOSIS — G9341 Metabolic encephalopathy: Secondary | ICD-10-CM

## 2024-04-23 DIAGNOSIS — I4891 Unspecified atrial fibrillation: Secondary | ICD-10-CM

## 2024-04-23 DIAGNOSIS — J9811 Atelectasis: Secondary | ICD-10-CM | POA: Diagnosis not present

## 2024-04-23 DIAGNOSIS — W19XXXD Unspecified fall, subsequent encounter: Secondary | ICD-10-CM

## 2024-04-23 DIAGNOSIS — R338 Other retention of urine: Secondary | ICD-10-CM

## 2024-04-23 DIAGNOSIS — Z66 Do not resuscitate: Secondary | ICD-10-CM

## 2024-04-23 DIAGNOSIS — N179 Acute kidney failure, unspecified: Secondary | ICD-10-CM

## 2024-04-23 DIAGNOSIS — M6282 Rhabdomyolysis: Secondary | ICD-10-CM

## 2024-04-23 DIAGNOSIS — F03C11 Unspecified dementia, severe, with agitation: Secondary | ICD-10-CM | POA: Diagnosis not present

## 2024-04-23 DIAGNOSIS — F0390 Unspecified dementia without behavioral disturbance: Secondary | ICD-10-CM

## 2024-04-23 DIAGNOSIS — D72829 Elevated white blood cell count, unspecified: Secondary | ICD-10-CM

## 2024-04-23 DIAGNOSIS — E119 Type 2 diabetes mellitus without complications: Secondary | ICD-10-CM

## 2024-04-23 DIAGNOSIS — R0989 Other specified symptoms and signs involving the circulatory and respiratory systems: Secondary | ICD-10-CM | POA: Diagnosis not present

## 2024-04-23 DIAGNOSIS — N401 Enlarged prostate with lower urinary tract symptoms: Secondary | ICD-10-CM

## 2024-04-23 DIAGNOSIS — R0602 Shortness of breath: Secondary | ICD-10-CM | POA: Diagnosis not present

## 2024-04-23 LAB — MAGNESIUM: Magnesium: 1.6 mg/dL — ABNORMAL LOW (ref 1.7–2.4)

## 2024-04-23 LAB — COMPREHENSIVE METABOLIC PANEL WITH GFR
ALT: 49 U/L — ABNORMAL HIGH (ref 0–44)
AST: 29 U/L (ref 15–41)
Albumin: 2.3 g/dL — ABNORMAL LOW (ref 3.5–5.0)
Alkaline Phosphatase: 63 U/L (ref 38–126)
Anion gap: 11 (ref 5–15)
BUN: 32 mg/dL — ABNORMAL HIGH (ref 8–23)
CO2: 26 mmol/L (ref 22–32)
Calcium: 8.8 mg/dL — ABNORMAL LOW (ref 8.9–10.3)
Chloride: 106 mmol/L (ref 98–111)
Creatinine, Ser: 1.35 mg/dL — ABNORMAL HIGH (ref 0.61–1.24)
GFR, Estimated: 52 mL/min — ABNORMAL LOW (ref >60)
Glucose, Bld: 133 mg/dL — ABNORMAL HIGH (ref 70–99)
Potassium: 3.3 mmol/L — ABNORMAL LOW (ref 3.5–5.1)
Sodium: 143 mmol/L (ref 135–145)
Total Bilirubin: 1 mg/dL (ref 0.0–1.2)
Total Protein: 5.4 g/dL — ABNORMAL LOW (ref 6.5–8.1)

## 2024-04-23 LAB — CBC
HCT: 40.3 % (ref 39.0–52.0)
Hemoglobin: 13.1 g/dL (ref 13.0–17.0)
MCH: 28.4 pg (ref 26.0–34.0)
MCHC: 32.5 g/dL (ref 30.0–36.0)
MCV: 87.4 fL (ref 80.0–100.0)
Platelets: 298 K/uL (ref 150–400)
RBC: 4.61 MIL/uL (ref 4.22–5.81)
RDW: 13 % (ref 11.5–15.5)
WBC: 16.6 K/uL — ABNORMAL HIGH (ref 4.0–10.5)
nRBC: 0 % (ref 0.0–0.2)

## 2024-04-23 LAB — C-REACTIVE PROTEIN: CRP: 14.1 mg/dL — ABNORMAL HIGH (ref <1.0)

## 2024-04-23 LAB — URINE CULTURE: Culture: 50000 — AB

## 2024-04-23 LAB — PROCALCITONIN: Procalcitonin: <0.1 ng/mL

## 2024-04-23 MED ORDER — POTASSIUM CHLORIDE CRYS ER 20 MEQ PO TBCR
40.0000 meq | EXTENDED_RELEASE_TABLET | Freq: Once | ORAL | Status: AC
Start: 2024-04-23 — End: 2024-04-23
  Administered 2024-04-23: 40 meq via ORAL
  Filled 2024-04-23: qty 2

## 2024-04-23 MED ORDER — MAGNESIUM SULFATE 4 GM/100ML IV SOLN
4.0000 g | Freq: Once | INTRAVENOUS | Status: AC
  Administered 2024-04-23: 4 g via INTRAVENOUS
  Filled 2024-04-23: qty 100

## 2024-04-23 NOTE — Evaluation (Signed)
 Physical Therapy Evaluation Patient Details Name: Richard Frederick MRN: [id number] DOB: 23-Jun-1941 Today's Date: 04/23/2024  History of Present Illness  83 y.o. male admitted on 9/23 for a fall from SNF. Past medical history  of diabetes mellitus type 2, essential hypertension, paroxysmal A-fib, dementia with behavioral disturbance Recently admitted on 10 September for similar presentation with fall patient also was confused, along with noted slurred speech and delayed response, patient at baseline noted to be alert and oriented to self.  Patient was discharged on 13 September.  Clinical Impression  Pt presents with admitting diagnosis above. Co treat with SLP. Pt today was able to stand and take sidesteps along EOB with +2 Min/Mod A. PTA pt wife reports that he has not been ambulatory and needed lots of assistance. Patient will benefit from continued inpatient follow up therapy, <3 hours/day. PT will continue to follow.         If plan is discharge home, recommend the following: A little help with walking and/or transfers;A little help with bathing/dressing/bathroom;Direct supervision/assist for medications management;Assist for transportation;Assistance with cooking/housework;Help with stairs or ramp for entrance;Supervision due to cognitive status   Can travel by private vehicle   No    Equipment Recommendations Other (comment)  Recommendations for Other Services       Functional Status Assessment Patient has had a recent decline in their functional status and demonstrates the ability to make significant improvements in function in a reasonable and predictable amount of time.     Precautions / Restrictions Precautions Precautions: Fall Recall of Precautions/Restrictions: Impaired Restrictions Weight Bearing Restrictions Per Provider Order: No      Mobility  Bed Mobility Overal bed mobility: Needs Assistance Bed Mobility: Supine to Sit, Sit to Supine     Supine to sit: Min  assist, +2 for physical assistance Sit to supine: Min assist   General bed mobility comments: Cues for hand placement and sequencing. Assistance with BLE and line management.    Transfers Overall transfer level: Needs assistance Equipment used: 2 person hand held assist Transfers: Sit to/from Stand Sit to Stand: Min assist, +2 safety/equipment, Mod assist, +2 physical assistance           General transfer comment: +2 Min A to stand EOB with pt noted to have heavy posterior lean.    Ambulation/Gait               General Gait Details: Deferred for safety  Stairs            Wheelchair Mobility     Tilt Bed    Modified Rankin (Stroke Patients Only)       Balance Overall balance assessment: Needs assistance Sitting-balance support: Bilateral upper extremity supported, Feet supported Sitting balance-Leahy Scale: Fair     Standing balance support: Bilateral upper extremity supported, During functional activity, Reliant on assistive device for balance Standing balance-Leahy Scale: Poor                               Pertinent Vitals/Pain Pain Assessment Pain Assessment: Faces Faces Pain Scale: No hurt    Home Living Family/patient expects to be discharged to:: Skilled nursing facility   Available Help at Discharge: Family;Available 24 hours/day Type of Home: House             Additional Comments: Pt from [facility].    Prior Function Prior Level of Function : History of Falls (last six months);Needs assist  Mobility Comments: Pt wife reports that pt had not been ambulating recently and needed a lot of assistance.       Extremity/Trunk Assessment   Upper Extremity Assessment Upper Extremity Assessment: Generalized weakness    Lower Extremity Assessment Lower Extremity Assessment: Generalized weakness    Cervical / Trunk Assessment Cervical / Trunk Assessment: Kyphotic  Communication    Communication Communication: No apparent difficulties    Cognition Arousal: Alert Behavior During Therapy: WFL for tasks assessed/performed   PT - Cognitive impairments: History of cognitive impairments                       PT - Cognition Comments: Alert and cooperative. Advanced dementia at baseline. Following commands: Impaired Following commands impaired: Only follows one step commands consistently, Follows multi-step commands inconsistently     Cueing Cueing Techniques: Verbal cues, Gestural cues, Tactile cues     General Comments General comments (skin integrity, edema, etc.): VSS on RA. SpO2 90-91%    Exercises     Assessment/Plan    PT Assessment Patient needs continued PT services  PT Problem List Decreased strength;Decreased activity tolerance;Decreased balance;Decreased mobility;Decreased cognition;Decreased knowledge of use of DME;Decreased safety awareness       PT Treatment Interventions      PT Goals (Current goals can be found in the Care Plan section)  Acute Rehab PT Goals Patient Stated Goal: none stated/unable PT Goal Formulation: Patient unable to participate in goal setting Time For Goal Achievement: 05/07/24 Potential to Achieve Goals: Fair    Frequency Min 2X/week     Co-evaluation PT/OT/SLP Co-Evaluation/Treatment: Yes Reason for Co-Treatment: Necessary to address cognition/behavior during functional activity PT goals addressed during session: Mobility/safety with mobility;Balance;Proper use of DME   SLP goals addressed during session: Cognition;Communication     AM-PAC PT 6 Clicks Mobility  Outcome Measure Help needed turning from your back to your side while in a flat bed without using bedrails?: A Little Help needed moving from lying on your back to sitting on the side of a flat bed without using bedrails?: A Little Help needed moving to and from a bed to a chair (including a wheelchair)?: A Lot Help needed standing up from  a chair using your arms (e.g., wheelchair or bedside chair)?: A Lot Help needed to walk in hospital room?: Total Help needed climbing 3-5 steps with a railing? : Total 6 Click Score: 12    End of Session Equipment Utilized During Treatment: Gait belt Activity Tolerance: Patient tolerated treatment well Patient left: in bed;with call bell/phone within reach;with bed alarm set;with restraints reapplied Nurse Communication: Mobility status PT Visit Diagnosis: Unsteadiness on feet (R26.81);Other abnormalities of gait and mobility (R26.89);Muscle weakness (generalized) (M62.81);History of falling (Z91.81)    Time: 8573-8550 PT Time Calculation (min) (ACUTE ONLY): 23 min   Charges:   PT Evaluation $PT Eval Moderate Complexity: 1 Mod PT Treatments $Therapeutic Activity: 8-22 mins PT General Charges $$ ACUTE PT VISIT: 1 Visit         Kermit Arnette B, PT, DPT Acute Rehab Services [phone]   Richard Frederick 04/23/2024, 4:18 PM

## 2024-04-23 NOTE — Plan of Care (Signed)

## 2024-04-23 NOTE — Evaluation (Signed)
 Occupational Therapy Evaluation Patient Details Name: Richard Frederick MRN: [id number] DOB: April 05, 1941 Today's Date: 04/23/2024   History of Present Illness   83 y.o. male admitted on 9/23 for a fall from SNF. Past medical history  of diabetes mellitus type 2, essential hypertension, paroxysmal A-fib, dementia with behavioral disturbance Recently admitted on 10 September for similar presentation with fall patient also was confused, along with noted slurred speech and delayed response, patient at baseline noted to be alert and oriented to self.  Patient was discharged on 13 September.     Clinical Impressions This 83 yo male admitted with above presents to acute OT with PLOF of recently being DC'd from hospital and having another fall (this time at [facility]). He was very pleasant during our session and followed 1 step commands close to 100% of time. He had good sitting balance, but in standing he was a bit timid. He will continue to benefit from acute OT with follow up from continued inpatient follow up therapy, <3 hours/day.      If plan is discharge home, recommend the following:   Two people to help with walking and/or transfers;A lot of help with bathing/dressing/bathroom;Assistance with cooking/housework;Assistance with feeding;Help with stairs or ramp for entrance;Assist for transportation;Direct supervision/assist for financial management;Direct supervision/assist for medications management     Functional Status Assessment   Patient has had a recent decline in their functional status and demonstrates the ability to make significant improvements in function in a reasonable and predictable amount of time.     Equipment Recommendations   None recommended by OT      Precautions/Restrictions   Precautions Precautions: Fall Recall of Precautions/Restrictions: Impaired Restrictions Weight Bearing Restrictions Per Provider Order: No     Mobility Bed Mobility Overal bed  mobility: Needs Assistance Bed Mobility: Supine to Sit, Sit to Supine   Sidelying to sit: Contact guard assist, HOB elevated   Sit to supine: Contact guard assist        Transfers Overall transfer level: Needs assistance Equipment used: 1 person hand held assist Transfers: Sit to/from Stand Sit to Stand: Mod assist                  Balance Overall balance assessment: Needs assistance Sitting-balance support: No upper extremity supported Sitting balance-Leahy Scale: Fair     Standing balance support: Bilateral upper extremity supported, During functional activity Standing balance-Leahy Scale: Poor                             ADL either performed or assessed with clinical judgement   ADL Overall ADL's : Needs assistance/impaired Eating/Feeding: Total assistance   Grooming: Wash/dry hands;Moderate assistance;Sitting   Upper Body Bathing: Maximal assistance;Sitting   Lower Body Bathing: Maximal assistance Lower Body Bathing Details (indicate cue type and reason): Min a sit<>stand with Mod A to maintain standing Upper Body Dressing : Maximal assistance;Sitting   Lower Body Dressing: Total assistance Lower Body Dressing Details (indicate cue type and reason): Min a sit<>stand with Mod A to maintain standing Toilet Transfer: Moderate assistance;+2 for safety/equipment Toilet Transfer Details (indicate cue type and reason): Simulated  Bil HHA taking 8 steps forward and back from bed Toileting- Clothing Manipulation and Hygiene: Total assistance Toileting - Clothing Manipulation Details (indicate cue type and reason): Min a sit<>stand with Mod A to maintain standing             Vision Baseline Vision/History: 1 Wears glasses Ability  to See in Adequate Light:  (unknown)              Pertinent Vitals/Pain Pain Assessment Pain Assessment: PAINAD Breathing: normal Negative Vocalization: none Facial Expression: smiling or inexpressive Body  Language: relaxed Consolability: no need to console PAINAD Score: 0     Extremity/Trunk Assessment Upper Extremity Assessment Upper Extremity Assessment: Overall WFL for tasks assessed           Communication Communication Communication: Impaired Factors Affecting Communication:  (dementia)   Cognition Arousal: Alert Behavior During Therapy: WFL for tasks assessed/performed Cognition: History of cognitive impairments, No family/caregiver present to determine baseline, Cognition impaired   Orientation impairments: Place, Time, Situation Awareness: Intellectual awareness impaired, Online awareness impaired Memory impairment (select all impairments): Short-term memory, Working [employer] fast streamer, Non-declarative long-term memory, Geneticist, molecular long-term memory Attention impairment (select first level of impairment): Focused attention Executive functioning impairment (select all impairments): Initiation, Organization, Sequencing, Reasoning, Problem solving                   Following commands: Impaired Following commands impaired: Only follows one step commands consistently     Cueing    Cueing Techniques: Verbal cues;Gestural cues;Tactile cues;Visual cues              Home Living Family/patient expects to be discharged to:: Skilled nursing facility                                            OT Problem List: Impaired balance (sitting and/or standing);Decreased cognition;Decreased safety awareness   OT Treatment/Interventions: Self-care/ADL training;DME and/or AE instruction;Balance training;Patient/family education      OT Goals(Current goals can be found in the care plan section)   Acute Rehab OT Goals Patient Stated Goal: Agreeable to work with therapy, but dementia prevents him from stating a goal OT Goal Formulation: Patient unable to participate in goal setting Time For Goal Achievement: 05/07/24 Potential to Achieve Goals: Fair   OT  Frequency:  Min 2X/week       AM-PAC OT 6 Clicks Daily Activity     Outcome Measure Help from another person eating meals?: Total Help from another person taking care of personal grooming?: A Lot Help from another person toileting, which includes using toliet, bedpan, or urinal?: Total Help from another person bathing (including washing, rinsing, drying)?: A Lot Help from another person to put on and taking off regular upper body clothing?: A Lot Help from another person to put on and taking off regular lower body clothing?: Total 6 Click Score: 9   End of Session Equipment Utilized During Treatment: Gait belt  Activity Tolerance: Patient tolerated treatment well Patient left: in bed;with call bell/phone within reach;with bed alarm set  OT Visit Diagnosis: Unsteadiness on feet (R26.81);Other abnormalities of gait and mobility (R26.89);History of falling (Z91.81);Other symptoms and signs involving cognitive function;Cognitive communication deficit (R41.841) Symptoms and signs involving cognitive functions:  (dementia)                Time: 8499-8482 OT Time Calculation (min): 17 min Charges:  OT General Charges $OT Visit: 1 Visit OT Evaluation $OT Eval Moderate Complexity: 1 Mod  Cathy L. OT Acute Rehabilitation Services Office [phone]    Rodgers Dorothyann Distel 04/23/2024, 10:00 PM

## 2024-04-23 NOTE — Consult Note (Addendum)
 Palliative Care Consult Note                                  Date: 04/23/2024   Patient Name: Richard Frederick  DOB:02-01-1941  FMW:[id number]  Age / Sex:83 y.o., male  PCP: Dwight Trula SQUIBB, MD Referring Physician: Dennise Lavada POUR, MD  Reason for Consultation: Establishing goals of care  Past Medical History:  Diagnosis Date   A-fib (HCC)    Anxiety    Dementia (HCC)    Hyperlipemia    Hypertension    Type II diabetes mellitus (HCC)    Vision disturbance      Assessment & Plan:   HPI/Patient Profile: 83 y.o. male  with past medical history of dementia with behavioral disturbances, T2DM, and paroxysmal atrial fibrillation admitted on 04/21/2024 with acute metabolic encephalopathy with possible syncope. Of note patient was admitted 9/10-9/13 for increased confusion and unwitnessed fall. Palliative consulted for goals of care discussion.   Options made available to wife: Patient attempts rehab at [facility]  [facility] at [place] with hospice following Inpatient hospice Taking the patient home with hospice is not an option for wife as she is not able to fully care for him at this stage.   SUMMARY OF RECOMMENDATIONS   Continue DNR-limited Continue current management for now, wife wants more time to consider her options  Code Status: DNR - Limited (DNR/DNI)  Prognosis:  Unable to determine  Discharge Planning:  To Be Determined   Discussed with: Dennise MD, Millicent PEAK, Tina NP, Richard Frederick (wife)  Subjective:   Reviewed medical records, received report from team, assessed the patient and then meet at the patient's bedside to discuss diagnosis, prognosis, GOC, EOL wishes disposition and options.  Before meeting with the patient/family, I spent time reviewing the chart notes including previous admission and discharge notes, H&P, SLP/PT/OT notes. I also reviewed vital signs, nursing flowsheets, medication administrations record,  labs, and imaging. Imaging reviewed includes MRI head.   I met with Richard Frederick (wife).   We meet to discuss diagnosis prognosis, GOC, EOL wishes, disposition and options. Concept of Palliative Care was introduced as specialized medical care for people and their families living with serious illness.  If focuses on providing relief from the symptoms and stress of a serious illness.  The goal is to improve quality of life for both the patient and the family. Values and goals of care important to patient and family were attempted to be elicited.  Created space and opportunity for patient  and family to explore thoughts and feelings regarding current medical situation.   Natural trajectory and current clinical status were discussed. Questions and concerns addressed. Patient encouraged to call with questions or concerns.    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 - Understands that his decreased PO and possible aspiration pneumonia 2/2 dysphagia are all factors that is contributing to his more immediate decline that is exacerbated by the dementia - Discussed with wife progression of MRI head and the atrophy seen compared to previous imaging in 2019, which is expected in dementia allowing her to process the deterioration in her husband  Life Review: - Married to wife for 42 years, no children - Has a brother and some friends who are able to come  around and help occasionally  Baseline Status: - Patient has been dependent on wife for all ADLs since 2019 - Wife has been his primary caregiver and was living at home with her requiring 24/7 care up until hospitalization on 9/10-9/13, she can no longer handle him at home alone with his behavioral issues - Most recently has been at [facility] for short term rehab since discharge on 9/13 -  Ability to ambulate declined in the weeks prior to initial admission on 9/10-9/13, from not using a rolling walker to requiring for ambulation - Frequent falls in the last 3 weeks, wife reports 3 falls while staying at [facility] for rehab - Wife reports that he has not been participating in rehab at the facility  Today's Discussion: - Educated the patient's wife on chronic decline trajectory in those with dementia - Of note wife shared that patient's behavior worsened after receiving Ativan  - Discussed possible options for the patient including full medical interventions to prolong life, limited interventions, shifting focus to comfort measures, wife is not yet ready to make a decision and wants more time to consider her options - Wife did express that she does not want to continue repeated hospitalizations if the patient deteriorates again - Discussed MOST form with wife, clearly states that she would not want CPR, intubation, or feeding tube at this point  Goals: - Wife does not want patient to return to the hospital repeatedly  Review of Systems  Unable to perform ROS   Objective:   Primary Diagnoses: Present on Admission:  Essential hypertension  Paroxysmal A-fib (HCC)  Fall  Vital Signs:  BP (!) 151/80 (BP Location: Right Arm)   Pulse 82   Temp 98.3 F (36.8 C) (Oral)   Resp 17   SpO2 93%   Physical Exam Constitutional:      Comments: Sleeping during visit  HENT:     Head: Normocephalic.     Nose: Nose normal.  Pulmonary:     Effort: Pulmonary effort is normal.    Palliative Assessment/Data: 50%   Thank you for allowing us  to participate in the care of Richard Frederick PMT will continue to support holistically.  Time Total: 75 minutes  Detailed review of medical records (labs, imaging, vital signs), medically appropriate exam, discussed with treatment team, counseling and education to patient, family, & staff, documenting clinical information, medication  management, coordination of care.  Signed by: Fairy FORBES Shan DEVONNA Palliative Medicine Team  Team Phone # [phone] (Nights/Weekends)  04/23/2024, 12:20 PM

## 2024-04-23 NOTE — Progress Notes (Addendum)
 PROGRESS NOTE                                                                                                                                                                                                             Patient Demographics:    Richard Frederick, is a 83 y.o. male, DOB - 23-Jan-1941, FMW:[id number]  Outpatient Primary MD for the patient is Richard Trula SQUIBB, MD    LOS - 2  Admit date - 04/21/2024    Chief Complaint  Patient presents with   Fall       Brief Narrative (HPI from H&P)   83 y.o. male with past medical history  of diabetes mellitus type 2, essential hypertension, paroxysmal A-fib, dementia with behavioral disturbance Recently admitted on 10 September for similar presentation with fall patient also was confused, along with noted slurred speech and delayed response, patient at baseline noted to be alert and oriented to self.  Patient was discharged on 13 September, was in his nursing home, and the morning of admission was found lying on the ground on the left side at the nursing facility    Subjective:    Richard Frederick today has, No headache, No chest pain, No abdominal pain - No Nausea, No new weakness tingling or numbness, no SOB, overall confused but able to answer basic questions.   Assessment  & Plan :    Acute metabolic encephalopathy, underlying advanced dementia, possible syncope - patient admitted to the hospital with a fall while he was already at [facility] for ongoing weakness, delirium and falls at home, MRI brain, CT T and L-spine nonacute here, he has longstanding dementia and likely encephalopathy was worse due to urinary retention and UTI.  Continue supportive care, use Haldol  as needed along with Seroquel  and restraints as needed.   A-fib with RVR - he was noted on admission, has been started on diltiazem  infusion.  Continue for now until he has consistent p.o. intake and will transition to oral  agents.  He is a very poor candidate for coagulation given history of falls, ambulatory dysfunction   Leukocytosis-white count was found to be 25.4 on admission, improved to 21 this morning.  He was given ceftriaxone  for presumed urinary source, however urinalysis not that concerning for UTI.  Given dementia I am more concerned about silent aspiration, or some nonspecific  findings on the CT scan of the chest, start Unasyn    Acute kidney injury -creatinine on presentation 2.0 (had a value of 0.9 before, but the whole BMP looks off).  Likely due to dehydration/poor p.o. intake and also acute urinary retention.  Continue fluids, creatinine improving   Essential hypertension-continue diltiazem  infusion for now   Advanced dementia-continue donepezil , memantine , Zoloft    Acute urinary retention, BPH-urology saw patient and had to have a Foley catheter due to acute retention, add Flomax .  Urinalysis not that impressive or too concerning for UTI.  Pending cultures   Mild rhabdo-on fluids   Hypokalemia and hypomagnesemia.  Replaced.    GOC - discussed with the wife this afternoon, he has advanced dementia, poor quality of life, refusing care intermittently. Code status changed to DNR  DM 2-A1c in the 5 range.  Lab Results  Component Value Date   HGBA1C 5.4 04/08/2024   CBG (last 3)  No results for input(s): GLUCAP in the last 72 hours.        Condition - Extremely Guarded  Family Communication  :  wife Richard Frederick [phone]  in detail on 04/23/24  Code Status :  DNR  Consults  :  Urology, palliative care for goals of care  PUD Prophylaxis :    Procedures  :     CT T&L Spine - No acute traumatic injury to the thoracic or lumbar spine  MRI - non acute  CT chest abdomen pelvis.  1. No acute traumatic injury to the chest, abdomen or pelvis. 2. Multiple other nonacute observations, as described above.      Disposition Plan  :    Status is: Inpatient  DVT Prophylaxis  :     heparin  injection 5,000 Units Start: 04/21/24 2200    Lab Results  Component Value Date   PLT 298 04/23/2024    Diet :  Diet Order             Diet regular Fluid consistency: Thin  Diet effective now                    Inpatient Medications  Scheduled Meds:  Chlorhexidine  Gluconate Cloth  6 each Topical Daily   diltiazem   90 mg Oral Q12H   donepezil   10 mg Oral Daily   heparin   5,000 Units Subcutaneous Q8H   memantine   10 mg Oral BID   potassium chloride   40 mEq Oral Once   QUEtiapine   12.5 mg Oral Daily   QUEtiapine   25 mg Oral QHS   sertraline   50 mg Oral Daily   sodium chloride  flush  3 mL Intravenous Q12H   tamsulosin   0.4 mg Oral Daily   Continuous Infusions:  ampicillin -sulbactam (UNASYN ) IV 3 g (04/23/24 0446)   lactated ringers  75 mL/hr at 04/22/24 1214   PRN Meds:.acetaminophen  **OR** acetaminophen , haloperidol  lactate, hydrALAZINE   Antibiotics  :    Anti-infectives (From admission, onward)    Start     Dose/Rate Route Frequency Ordered Stop   04/22/24 1100  Ampicillin -Sulbactam (UNASYN ) 3 g in sodium chloride  0.9 % 100 mL IVPB        3 g 200 mL/hr over 30 Minutes Intravenous Every 8 hours 04/22/24 1048 04/27/24 1059   04/21/24 2015  cefTRIAXone  (ROCEPHIN ) 1 g in sodium chloride  0.9 % 100 mL IVPB  Status:  Discontinued        1 g 200 mL/hr over 30 Minutes Intravenous Every 24 hours 04/21/24 2014 04/22/24 1049  04/21/24 1400  cefTRIAXone  (ROCEPHIN ) 1 g in sodium chloride  0.9 % 100 mL IVPB        1 g 200 mL/hr over 30 Minutes Intravenous  Once 04/21/24 1348 04/21/24 1744         Objective:   Vitals:   04/22/24 1952 04/22/24 2328 04/23/24 0400 04/23/24 0700  BP: 137/77 108/71 (!) 124/98 (!) 141/79  Pulse: 98 92 82   Resp: 20 15 17    Temp: 97.9 F (36.6 C) 98.2 F (36.8 C) 98 F (36.7 C) 98 F (36.7 C)  TempSrc: Axillary Axillary Axillary Oral  SpO2: 90% 93% 93%     Wt Readings from Last 3 Encounters:  04/12/24 81.6 kg  04/09/24  82.1 kg  03/10/24 82.1 kg     Intake/Output Summary (Last 24 hours) at 04/23/2024 0943 Last data filed at 04/23/2024 0700 Gross per 24 hour  Intake --  Output 1600 ml  Net -1600 ml     Physical Exam  Awake but confused, No new F.N deficits, Normal affect [place].AT,PERRAL Supple Neck, No JVD,   Symmetrical Chest wall movement, Good air movement bilaterally, CTAB RRR,No Gallops,Rubs or new Murmurs,  +ve B.Sounds, Abd Soft, No tenderness,   No Cyanosis, Clubbing or edema, foley in place       Data Review:    Recent Labs  Lab 04/21/24 1029 04/21/24 1142 04/22/24 0439 04/23/24 0214  WBC  --  25.4* 21.4* 16.6*  HGB 9.2* 15.7 13.7 13.1  HCT 27.0* 48.1 42.1 40.3  PLT  --  346 323 298  MCV  --  86.8 87.7 87.4  MCH  --  28.3 28.5 28.4  MCHC  --  32.6 32.5 32.5  RDW  --  13.2 13.1 13.0  LYMPHSABS  --  1.1  --   --   MONOABS  --  1.5*  --   --   EOSABS  --  0.3  --   --   BASOSABS  --  0.1  --   --     Recent Labs  Lab 04/21/24 1029 04/21/24 1142 04/21/24 1641 04/21/24 1642 04/21/24 1705 04/21/24 1928 04/22/24 0439 04/23/24 0214 04/23/24 0757  NA 151* 143  --   --   --   --  144 143  --   K 2.0* 3.6  --   --   --   --  3.8 3.3*  --   CL 120* 101  --   --   --   --  105 106  --   CO2  --  26  --   --   --   --  27 26  --   ANIONGAP  --  16*  --   --   --   --  12 11  --   GLUCOSE 103* 173*  --   --   --   --  148* 133*  --   BUN 37* 62*  --   --   --   --  43* 32*  --   CREATININE 0.90 2.03*  --   --   --   --  1.55* 1.35*  --   AST  --  42*  --   --   --   --  35 29  --   ALT  --  61*  --   --   --   --  50* 49*  --   ALKPHOS  --  77  --   --   --   --  66 63  --   BILITOT  --  1.1  --   --   --   --  1.0 1.0  --   ALBUMIN  --  3.3*  --   --   --   --  2.5* 2.3*  --   CRP  --   --   --   --   --   --   --   --  14.1*  PROCALCITON  --   --   --   --   --   --   --  <0.10  --   LATICACIDVEN  --   --   --   --  1.6 1.5  --   --   --   TSH  --   --  4.203  --   --    --   --   --   --   MG  --   --   --  1.7  --   --   --  1.6*  --   CALCIUM  --  10.1  --   --   --   --  9.1 8.8*  --       Recent Labs  Lab 04/21/24 1142 04/21/24 1641 04/21/24 1642 04/21/24 1705 04/21/24 1928 04/22/24 0439 04/23/24 0214 04/23/24 0757  CRP  --   --   --   --   --   --   --  14.1*  PROCALCITON  --   --   --   --   --   --  <0.10  --   LATICACIDVEN  --   --   --  1.6 1.5  --   --   --   TSH  --  4.203  --   --   --   --   --   --   MG  --   --  1.7  --   --   --  1.6*  --   CALCIUM 10.1  --   --   --   --  9.1 8.8*  --     --------------------------------------------------------------------------------------------------------------- Lab Results  Component Value Date   CHOL 193 04/09/2024   HDL 47 04/09/2024   LDLCALC 131 (H) 04/09/2024   TRIG 76 04/09/2024   CHOLHDL 4.1 04/09/2024    Lab Results  Component Value Date   HGBA1C 5.4 04/08/2024   Recent Labs    04/21/24 1641  TSH 4.203   No results for input(s): VITAMINB12, FOLATE, FERRITIN, TIBC, IRON, RETICCTPCT in the last 72 hours. ------------------------------------------------------------------------------------------------------------------ Cardiac Enzymes No results for input(s): CKMB, TROPONINI, MYOGLOBIN in the last 168 hours.  Invalid input(s): CK  Micro Results Recent Results (from the past 240 hours)  Urine Culture     Status: Abnormal   Collection Time: 04/21/24  1:10 PM   Specimen: Urine, Catheterized  Result Value Ref Range Status   Specimen Description URINE, CATHETERIZED  Final   Special Requests   Final    NONE Performed at [facility], [address] [place], [place] [address]    Culture 50,000 COLONIES/mL ESCHERICHIA COLI (A)  Final   Report Status 04/23/2024 FINAL  Final   Organism ID, Bacteria ESCHERICHIA COLI (A)  Final      Susceptibility   Escherichia coli - MIC*    AMPICILLIN  >=32 RESISTANT Resistant     CEFAZOLIN (URINE) Value in  next row Resistant      >=  32 RESISTANTThis is a modified FDA-approved test that has been validated and its performance characteristics determined by the reporting laboratory.  This laboratory is certified under the Clinical Laboratory Improvement Amendments CLIA as qualified to perform high complexity clinical laboratory testing.    CEFEPIME Value in next row Sensitive      >=32 RESISTANTThis is a modified FDA-approved test that has been validated and its performance characteristics determined by the reporting laboratory.  This laboratory is certified under the Clinical Laboratory Improvement Amendments CLIA as qualified to perform high complexity clinical laboratory testing.    ERTAPENEM Value in next row Sensitive      >=32 RESISTANTThis is a modified FDA-approved test that has been validated and its performance characteristics determined by the reporting laboratory.  This laboratory is certified under the Clinical Laboratory Improvement Amendments CLIA as qualified to perform high complexity clinical laboratory testing.    CEFTRIAXONE  Value in next row Sensitive      >=32 RESISTANTThis is a modified FDA-approved test that has been validated and its performance characteristics determined by the reporting laboratory.  This laboratory is certified under the Clinical Laboratory Improvement Amendments CLIA as qualified to perform high complexity clinical laboratory testing.    CIPROFLOXACIN Value in next row Sensitive      >=32 RESISTANTThis is a modified FDA-approved test that has been validated and its performance characteristics determined by the reporting laboratory.  This laboratory is certified under the Clinical Laboratory Improvement Amendments CLIA as qualified to perform high complexity clinical laboratory testing.    GENTAMICIN Value in next row Sensitive      >=32 RESISTANTThis is a modified FDA-approved test that has been validated and its performance characteristics determined by the reporting  laboratory.  This laboratory is certified under the Clinical Laboratory Improvement Amendments CLIA as qualified to perform high complexity clinical laboratory testing.    NITROFURANTOIN Value in next row Sensitive      >=32 RESISTANTThis is a modified FDA-approved test that has been validated and its performance characteristics determined by the reporting laboratory.  This laboratory is certified under the Clinical Laboratory Improvement Amendments CLIA as qualified to perform high complexity clinical laboratory testing.    TRIMETH/SULFA Value in next row Sensitive      >=32 RESISTANTThis is a modified FDA-approved test that has been validated and its performance characteristics determined by the reporting laboratory.  This laboratory is certified under the Clinical Laboratory Improvement Amendments CLIA as qualified to perform high complexity clinical laboratory testing.    AMPICILLIN /SULBACTAM Value in next row Resistant      >=32 RESISTANTThis is a modified FDA-approved test that has been validated and its performance characteristics determined by the reporting laboratory.  This laboratory is certified under the Clinical Laboratory Improvement Amendments CLIA as qualified to perform high complexity clinical laboratory testing.    PIP/TAZO Value in next row Sensitive      16 SENSITIVEThis is a modified FDA-approved test that has been validated and its performance characteristics determined by the reporting laboratory.  This laboratory is certified under the Clinical Laboratory Improvement Amendments CLIA as qualified to perform high complexity clinical laboratory testing.    MEROPENEM Value in next row Sensitive      16 SENSITIVEThis is a modified FDA-approved test that has been validated and its performance characteristics determined by the reporting laboratory.  This laboratory is certified under the Clinical Laboratory Improvement Amendments CLIA as qualified to perform high complexity clinical  laboratory testing.    * 50,000 COLONIES/mL ESCHERICHIA  COLI  Blood culture (routine x 2)     Status: None (Preliminary result)   Collection Time: 04/21/24  1:49 PM   Specimen: BLOOD LEFT HAND  Result Value Ref Range Status   Specimen Description BLOOD LEFT HAND  Final   Special Requests   Final    BOTTLES DRAWN AEROBIC AND ANAEROBIC Blood Culture results may not be optimal due to an inadequate volume of blood received in culture bottles   Culture   Final    NO GROWTH 2 DAYS Performed at [facility], [address], [place], [place] [address]    Report Status PENDING  Incomplete  Blood culture (routine x 2)     Status: None (Preliminary result)   Collection Time: 04/22/24  1:34 PM   Specimen: BLOOD RIGHT ARM  Result Value Ref Range Status   Specimen Description BLOOD RIGHT ARM  Final   Special Requests   Final    BOTTLES DRAWN AEROBIC AND ANAEROBIC Blood Culture adequate volume   Culture   Final    NO GROWTH < 24 HOURS Performed at [facility], [address], [place], [place] [address]    Report Status PENDING  Incomplete    Radiology Report DG Chest Port 1 View Result Date: 04/23/2024 EXAM: 1 VIEW(S) XRAY OF THE CHEST 04/23/2024 06:35:36 AM COMPARISON: 04/21/2024 CLINICAL HISTORY: SOB (shortness of breath) [id number]. Sob; rover FINDINGS: LUNGS AND PLEURA: Low lung volumes. Basilar atelectasis. No focal pulmonary opacity. No pulmonary edema. No pleural effusion. No pneumothorax. HEART AND MEDIASTINUM: Atherosclerotic calcifications. BONES AND SOFT TISSUES: No acute osseous abnormality. IMPRESSION: 1. No acute cardiopulmonary findings. 2. Low lung volumes with basilar atelectasis. Electronically signed by: Waddell Calk MD 04/23/2024 06:39 AM EDT RP Workstation: [id number]   MR BRAIN WO CONTRAST Result Date: 04/22/2024 EXAM: MRI BRAIN WITHOUT CONTRAST 04/22/2024 10:12:41 AM TECHNIQUE: Multiplanar multisequence MRI of the head/brain was performed without the administration  of intravenous contrast. COMPARISON: CT head without contrast 04/21/2024. MR head without contrast 06/23/2018. CLINICAL HISTORY: AMS/ Suspect TIA. Ams dementia. FINDINGS: BRAIN AND VENTRICLES: Moderate atrophy and white matter disease has progressed since the prior exam. Mild white matter changes extend into the brain stem. No acute infarct. No intracranial hemorrhage. No mass. No midline shift. No hydrocephalus. The sella is unremarkable. Normal flow voids. ORBITS: No acute abnormality. SINUSES AND MASTOIDS: No acute abnormality. BONES AND SOFT TISSUES: Normal marrow signal. No acute soft tissue abnormality. IMPRESSION: 1. No acute intracranial abnormality. 2. Moderate atrophy and white matter disease, progressed since the prior exam, with mild white matter changes extending into the brain stem. This most likely reflects the sequelae of chronic microvascular ischemia. Electronically signed by: Lonni Necessary MD 04/22/2024 10:21 AM EDT RP Workstation: [id number]   CT T-SPINE NO CHARGE Result Date: 04/21/2024 CLINICAL DATA:  Status post fall.  Altered mental status. EXAM: CT Thoracic and Lumbar spine with contrast TECHNIQUE: Multiplanar CT images of the thoracic and lumbar spine were reconstructed from contemporary CT of the Chest, Abdomen, and Pelvis. RADIATION DOSE REDUCTION: This exam was performed according to the departmental dose-optimization program which includes automated exposure control, adjustment of the mA and/or kV according to patient size and/or use of iterative reconstruction technique. CONTRAST:  None or No additional COMPARISON:  None Available. FINDINGS: CT THORACIC SPINE FINDINGS Alignment: Normal. Vertebrae: There is diffuse osteopenia of the visualized osseous structures. No acute fracture or focal pathologic process. Paraspinal and other soft tissues: Negative. Disc levels: Intervertebral disc heights are maintained.  Mild facet arthropathy marginal osteophyte formation. CT LUMBAR SPINE  FINDINGS Segmentation: 5 lumbar type vertebrae. Alignment: Normal. Vertebrae: No acute fracture or focal pathologic process. Paraspinal and other soft tissues: Negative. Disc levels: Intervertebral disc heights are maintained. Mild multilevel marginal osteophyte formation. IMPRESSION: *No acute traumatic injury to the thoracic or lumbar spine. Electronically Signed   By: Ree Molt M.D.   On: 04/21/2024 13:08   CT L-SPINE NO CHARGE Result Date: 04/21/2024 CLINICAL DATA:  Status post fall.  Altered mental status. EXAM: CT Thoracic and Lumbar spine with contrast TECHNIQUE: Multiplanar CT images of the thoracic and lumbar spine were reconstructed from contemporary CT of the Chest, Abdomen, and Pelvis. RADIATION DOSE REDUCTION: This exam was performed according to the departmental dose-optimization program which includes automated exposure control, adjustment of the mA and/or kV according to patient size and/or use of iterative reconstruction technique. CONTRAST:  None or No additional COMPARISON:  None Available. FINDINGS: CT THORACIC SPINE FINDINGS Alignment: Normal. Vertebrae: There is diffuse osteopenia of the visualized osseous structures. No acute fracture or focal pathologic process. Paraspinal and other soft tissues: Negative. Disc levels: Intervertebral disc heights are maintained. Mild facet arthropathy marginal osteophyte formation. CT LUMBAR SPINE FINDINGS Segmentation: 5 lumbar type vertebrae. Alignment: Normal. Vertebrae: No acute fracture or focal pathologic process. Paraspinal and other soft tissues: Negative. Disc levels: Intervertebral disc heights are maintained. Mild multilevel marginal osteophyte formation. IMPRESSION: *No acute traumatic injury to the thoracic or lumbar spine. Electronically Signed   By: Ree Molt M.D.   On: 04/21/2024 13:08   CT Head Wo Contrast Result Date: 04/21/2024 CLINICAL DATA:  Head trauma, minor (Age >= 65y); Neck trauma (Age >= 65y). Status post fall.  Altered mental status. EXAM: CT HEAD WITHOUT CONTRAST CT CERVICAL SPINE WITHOUT CONTRAST TECHNIQUE: Multidetector CT imaging of the head and cervical spine was performed following the standard protocol without intravenous contrast. Multiplanar CT image reconstructions of the cervical spine were also generated. RADIATION DOSE REDUCTION: This exam was performed according to the departmental dose-optimization program which includes automated exposure control, adjustment of the mA and/or kV according to patient size and/or use of iterative reconstruction technique. COMPARISON:  CT scan head and cervical spine from 04/08/2024. FINDINGS: CT HEAD FINDINGS Brain: No evidence of acute infarction, hemorrhage, hydrocephalus, extra-axial collection or mass lesion/mass effect. There is bilateral periventricular hypodensity, which is non-specific but most likely seen in the settings of microvascular ischemic changes. Moderate in extent. Otherwise normal appearance of brain parenchyma. Ventricles are normal. Cerebral volume is age appropriate. Vascular: No hyperdense vessel or unexpected calcification. Intracranial arteriosclerosis. Skull: Normal. Negative for fracture or focal lesion. Sinuses/Orbits: No acute finding. Other: Visualized mastoid air cells are unremarkable. No mastoid effusion. CT CERVICAL SPINE FINDINGS Alignment: Mild/grade 1 retrolisthesis of C3 over C4 and mild/grade 1 anterolisthesis of C7 over T1, most likely degenerative in essentially similar to the prior study. This examination does not assess for ligamentous injury or stability. Skull base and vertebrae: No acute fracture. No primary bone lesion or focal pathologic process. Soft tissues and spinal canal: No prevertebral fluid or swelling. No visible canal hematoma. Disc levels: Mild multilevel changes characterized by reduced intervertebral disc height, facet arthropathy marginal osteophyte formation. Upper chest: Negative. Other: None. IMPRESSION: 1. No  acute intracranial abnormality. 2. No acute osseous injury or traumatic listhesis of the cervical spine. Electronically Signed   By: Ree Molt M.D.   On: 04/21/2024 13:03   CT Cervical Spine Wo Contrast Result Date: 04/21/2024 CLINICAL DATA:  Head trauma, minor (Age >= 65y); Neck trauma (Age >= 65y). Status post fall. Altered mental status. EXAM: CT HEAD WITHOUT CONTRAST CT CERVICAL SPINE WITHOUT CONTRAST TECHNIQUE: Multidetector CT imaging of the head and cervical spine was performed following the standard protocol without intravenous contrast. Multiplanar CT image reconstructions of the cervical spine were also generated. RADIATION DOSE REDUCTION: This exam was performed according to the departmental dose-optimization program which includes automated exposure control, adjustment of the mA and/or kV according to patient size and/or use of iterative reconstruction technique. COMPARISON:  CT scan head and cervical spine from 04/08/2024. FINDINGS: CT HEAD FINDINGS Brain: No evidence of acute infarction, hemorrhage, hydrocephalus, extra-axial collection or mass lesion/mass effect. There is bilateral periventricular hypodensity, which is non-specific but most likely seen in the settings of microvascular ischemic changes. Moderate in extent. Otherwise normal appearance of brain parenchyma. Ventricles are normal. Cerebral volume is age appropriate. Vascular: No hyperdense vessel or unexpected calcification. Intracranial arteriosclerosis. Skull: Normal. Negative for fracture or focal lesion. Sinuses/Orbits: No acute finding. Other: Visualized mastoid air cells are unremarkable. No mastoid effusion. CT CERVICAL SPINE FINDINGS Alignment: Mild/grade 1 retrolisthesis of C3 over C4 and mild/grade 1 anterolisthesis of C7 over T1, most likely degenerative in essentially similar to the prior study. This examination does not assess for ligamentous injury or stability. Skull base and vertebrae: No acute fracture. No primary  bone lesion or focal pathologic process. Soft tissues and spinal canal: No prevertebral fluid or swelling. No visible canal hematoma. Disc levels: Mild multilevel changes characterized by reduced intervertebral disc height, facet arthropathy marginal osteophyte formation. Upper chest: Negative. Other: None. IMPRESSION: 1. No acute intracranial abnormality. 2. No acute osseous injury or traumatic listhesis of the cervical spine. Electronically Signed   By: Ree Molt M.D.   On: 04/21/2024 13:03   CT CHEST ABDOMEN PELVIS W CONTRAST Result Date: 04/21/2024 CLINICAL DATA:  Polytrauma, blunt. Fall. Altered mental status. History of dementia. EXAM: CT CHEST, ABDOMEN, AND PELVIS WITH CONTRAST TECHNIQUE: Multidetector CT imaging of the chest, abdomen and pelvis was performed following the standard protocol during bolus administration of intravenous contrast. RADIATION DOSE REDUCTION: This exam was performed according to the departmental dose-optimization program which includes automated exposure control, adjustment of the mA and/or kV according to patient size and/or use of iterative reconstruction technique. CONTRAST:  75mL OMNIPAQUE  IOHEXOL  350 MG/ML SOLN COMPARISON:  None Available. FINDINGS: CT CHEST FINDINGS Cardiovascular: Normal cardiac size. No pericardial effusion. No aortic aneurysm. There are coronary artery calcifications, in keeping with coronary artery disease. There are also moderate peripheral atherosclerotic vascular calcifications of thoracic aorta and its major branches. Mediastinum/Nodes: Visualized thyroid gland appears grossly unremarkable. No solid / cystic mediastinal masses. The esophagus is nondistended precluding optimal assessment. No axillary, mediastinal or hilar lymphadenopathy by size criteria. Lungs/Pleura: The central tracheo-bronchial tree is patent. There is mild, smooth, circumferential thickening of the segmental and subsegmental bronchial walls, throughout bilateral lungs, which  is nonspecific. Findings are most commonly seen with bronchitis or reactive airway disease, such as asthma. There is mosaic attenuation of lungs, consistent with heterogeneous air trapping related to small airways disease. There are patchy areas of linear, plate-like atelectasis and/or scarring throughout bilateral lungs. No mass or consolidation. No pleural effusion or pneumothorax. No suspicious lung nodules. Musculoskeletal: The visualized soft tissues of the chest wall are grossly unremarkable. No suspicious osseous lesions. There are mild multilevel degenerative changes in the visualized spine. CT ABDOMEN PELVIS FINDINGS Hepatobiliary: The liver is normal in size. Non-cirrhotic configuration. No  suspicious mass. There are 2, sub 5 mm, hypoattenuating foci in the left hepatic lobe, which are too small to adequately characterize. No intrahepatic or extrahepatic bile duct dilation. No calcified gallstones. Normal gallbladder wall thickness. No pericholecystic inflammatory changes. Pancreas: Unremarkable. No pancreatic ductal dilatation or surrounding inflammatory changes. Spleen: Within normal limits. No focal lesion. Adrenals/Urinary Tract: Adrenal glands are unremarkable. No suspicious renal mass. Bilateral mild hydronephrosis noted, right more than left. However, no hydroureter. Findings are likely due to distended bladder. No nephroureterolithiasis. Unremarkable urinary bladder. Stomach/Bowel: No disproportionate dilation of the small or large bowel loops. No evidence of abnormal bowel wall thickening or inflammatory changes. The appendix is unremarkable. There is a small diverticulum arising from the second part of duodenum. Vascular/Lymphatic: No ascites or pneumoperitoneum. No abdominal or pelvic lymphadenopathy, by size criteria. No aneurysmal dilation of the major abdominal arteries. There are moderate peripheral atherosclerotic vascular calcifications of the aorta and its major branches. Reproductive:  Enlarged prostate. Symmetric seminal vesicles. Other: There are fat containing umbilical and bilateral inguinal hernias. The soft tissues and abdominal wall are otherwise unremarkable. Musculoskeletal: No suspicious osseous lesions. There are mild multilevel degenerative changes in the visualized spine. IMPRESSION: 1. No acute traumatic injury to the chest, abdomen or pelvis. 2. Multiple other nonacute observations, as described above. Electronically Signed   By: Ree Molt M.D.   On: 04/21/2024 12:57   DG Pelvis Portable Result Date: 04/21/2024 CLINICAL DATA:  Unwitnessed fall. EXAM: PORTABLE PELVIS 1-2 VIEWS COMPARISON:  April 09, 2024 FINDINGS: There is no evidence of an acute pelvic fracture or diastasis. No pelvic bone lesions are seen. IMPRESSION: No acute osseous abnormality. Electronically Signed   By: Suzen Dials M.D.   On: 04/21/2024 10:49   DG Chest Portable 1 View Result Date: 04/21/2024 CLINICAL DATA:  Unwitnessed fall. EXAM: PORTABLE CHEST 1 VIEW COMPARISON:  April 08, 2024 FINDINGS: The heart size and mediastinal contours are within normal limits. There is marked severity calcification of the thoracic aorta. Low lung volumes are noted with mild, stable elevation of the right hemidiaphragm. Mild, stable linear scarring and/or atelectasis is seen within the bilateral lung bases. No pleural effusion or pneumothorax is identified. No acute osseous abnormalities are identified. IMPRESSION: Low lung volumes with mild, stable bibasilar linear scarring and/or atelectasis. Electronically Signed   By: Suzen Dials M.D.   On: 04/21/2024 10:48     Signature  -   Lavada Stank M.D on 04/23/2024 at 9:43 AM   -  To page go to [email or web address]

## 2024-04-23 NOTE — Evaluation (Signed)
 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   Vertigo 04/24/2016   Essential hypertension 04/24/2016   DM (diabetes mellitus), type 2 (HCC) 04/24/2016   Past Medical History:  Past Medical History:  Diagnosis Date   A-fib (HCC)    Anxiety    Dementia (HCC)    Hyperlipemia    Hypertension    Type II diabetes mellitus (HCC)    Vision disturbance    Past Surgical History:  Past Surgical History:  Procedure Laterality Date   CATARACT EXTRACTION W/ INTRAOCULAR LENS IMPLANT Right ~ 2013   COLONOSCOPY W/ BIOPSIES AND POLYPECTOMY     COLONOSCOPY WITH PROPOFOL  N/A 04/10/2016   Procedure: COLONOSCOPY WITH PROPOFOL ;  Surgeon: Gladis MARLA Louder, MD;  Location: [facility];  Service: Endoscopy;  Laterality: N/A;   TRANSURETHRAL RESECTION OF PROSTATE  early 2000s   HPI:  Richard Frederick is a 83 y.o. male with past medical history  of diabetes mellitus type 2, essential hypertension, paroxysmal A-fib, dementia with behavioral disturbance Recently admitted on 10 September for similar presentation with fall patient also was confused, along with noted slurred speech and delayed response, patient at baseline noted to be alert and oriented to self.  Patient was discharged on 13 September with a discharge diagnosis of acute metabolic encephalopathy with worsening dementia presumed TIA. On initial presentation patient is atraumatic in appearance no  bruises or lacerations to his head or extremity.  Patient was sitting in his wheelchair in the morning and was found lying on the ground on the left side at the nursing facility. MRI impression: Moderate atrophy and white matter disease, progressed since the prior exam,  with mild white matter changes extending into the brain stem. This most likely  reflects the sequelae of chronic microvascular ischemia.   Assessment / Plan / Recommendation Clinical Impression  Patient was evaluated via informal measures during co-treat with PT. Patient presents with deficits in short and long term memory, attention, awareness and problem solving. Expressive/receptive language seemingly intact, though patient with hypophonic vocal quality reducing intelligibility at times. Patient oriented to self, however disoriented to age, location (stating [place] ), situation and time. Patients wife reports memory has been declining for a while now, most notably in the past 5-6 months (diagnosed with acute metabolic encephalopathy and advanced dementia). Patient would benefit from continued acute ST services targeting speech, as this is seemingly the only acute change this admission. Patient would benefit further from [facility] ST targeting cognition.     SLP Assessment  SLP Recommendation/Assessment: Patient needs continued Speech Language Pathology Services SLP Visit Diagnosis: Cognitive communication deficit (R41.841)     Assistance Recommended at Discharge  Frequent or constant Supervision/Assistance  Functional Status Assessment Patient has had a recent decline in their functional status and/or demonstrates limited ability to make significant improvements in function in a reasonable and predictable amount of time  Frequency and Duration min 1 x/week  2 weeks      SLP Evaluation Cognition  Overall  Cognitive Status: Impaired/Different from baseline Arousal/Alertness: Awake/alert Orientation Level: Oriented to  person;Disoriented to time;Disoriented to place;Disoriented to situation Year:  (1942) Attention: Focused;Sustained Focused Attention: Impaired Focused Attention Impairment: Verbal basic;Functional basic Sustained Attention: Impaired Sustained Attention Impairment: Verbal basic;Functional basic Memory: Impaired Memory Impairment: Retrieval deficit;Decreased long term memory;Decreased short term memory Decreased Long Term Memory: Verbal basic;Functional basic Decreased Short Term Memory: Verbal basic;Functional basic Awareness: Impaired Awareness Impairment: Intellectual impairment;Emergent impairment Problem Solving: Impaired Problem Solving Impairment: Verbal basic;Functional basic Safety/Judgment: Impaired       Comprehension  Auditory Comprehension Overall Auditory Comprehension: Appears within functional limits for tasks assessed    Expression Expression Primary Mode of Expression: Verbal Verbal Expression Overall Verbal Expression: Appears within functional limits for tasks assessed   Oral / Motor  Oral Motor/Sensory Function Overall Oral Motor/Sensory Function: Within functional limits Motor Speech Overall Motor Speech: Impaired Respiration: Impaired Level of Impairment: Phrase Phonation: Low vocal intensity Intelligibility: Intelligibility reduced Phrase: 75-100% accurate Sentence: 75-100% accurate Effective Techniques: Increased vocal intensity;Over-articulate            Jacqueline Spofford M.A., CCC-SLP 04/23/2024, 2:58 PM

## 2024-04-23 NOTE — Progress Notes (Signed)
 PT Cancellation Note  Patient Details Name: DEVYN SHEERIN MRN: [id number] DOB: 30-Jan-1941   Cancelled Treatment:    Reason Eval/Treat Not Completed: Patient declined, no reason specified (Pt family declined as they were actively feeding patient. Politely requests PT to come back later.)   Brooklynne Pereida 04/23/2024, 1:57 PM

## 2024-04-24 DIAGNOSIS — Z7189 Other specified counseling: Secondary | ICD-10-CM | POA: Diagnosis not present

## 2024-04-24 DIAGNOSIS — Z515 Encounter for palliative care: Secondary | ICD-10-CM

## 2024-04-24 DIAGNOSIS — F03C11 Unspecified dementia, severe, with agitation: Secondary | ICD-10-CM

## 2024-04-24 DIAGNOSIS — Z66 Do not resuscitate: Secondary | ICD-10-CM

## 2024-04-24 DIAGNOSIS — W19XXXD Unspecified fall, subsequent encounter: Secondary | ICD-10-CM | POA: Diagnosis not present

## 2024-04-24 DIAGNOSIS — W19XXXA Unspecified fall, initial encounter: Secondary | ICD-10-CM | POA: Diagnosis not present

## 2024-04-24 LAB — CBC WITH DIFFERENTIAL/PLATELET
Abs Immature Granulocytes: 0.08 K/uL — ABNORMAL HIGH (ref 0.00–0.07)
Basophils Absolute: 0.1 K/uL (ref 0.0–0.1)
Basophils Relative: 1 %
Eosinophils Absolute: 0.7 K/uL — ABNORMAL HIGH (ref 0.0–0.5)
Eosinophils Relative: 5 %
HCT: 42.9 % (ref 39.0–52.0)
Hemoglobin: 14.1 g/dL (ref 13.0–17.0)
Immature Granulocytes: 1 %
Lymphocytes Relative: 13 %
Lymphs Abs: 1.9 K/uL (ref 0.7–4.0)
MCH: 28.7 pg (ref 26.0–34.0)
MCHC: 32.9 g/dL (ref 30.0–36.0)
MCV: 87.4 fL (ref 80.0–100.0)
Monocytes Absolute: 1.2 K/uL — ABNORMAL HIGH (ref 0.1–1.0)
Monocytes Relative: 8 %
Neutro Abs: 10.6 K/uL — ABNORMAL HIGH (ref 1.7–7.7)
Neutrophils Relative %: 72 %
Platelets: 341 K/uL (ref 150–400)
RBC: 4.91 MIL/uL (ref 4.22–5.81)
RDW: 12.8 % (ref 11.5–15.5)
WBC: 14.5 K/uL — ABNORMAL HIGH (ref 4.0–10.5)
nRBC: 0 % (ref 0.0–0.2)

## 2024-04-24 LAB — BASIC METABOLIC PANEL WITH GFR
Anion gap: 13 (ref 5–15)
BUN: 23 mg/dL (ref 8–23)
CO2: 25 mmol/L (ref 22–32)
Calcium: 8.7 mg/dL — ABNORMAL LOW (ref 8.9–10.3)
Chloride: 105 mmol/L (ref 98–111)
Creatinine, Ser: 1.25 mg/dL — ABNORMAL HIGH (ref 0.61–1.24)
GFR, Estimated: 57 mL/min — ABNORMAL LOW (ref >60)
Glucose, Bld: 109 mg/dL — ABNORMAL HIGH (ref 70–99)
Potassium: 3.5 mmol/L (ref 3.5–5.1)
Sodium: 143 mmol/L (ref 135–145)

## 2024-04-24 LAB — C-REACTIVE PROTEIN: CRP: 11.1 mg/dL — ABNORMAL HIGH (ref <1.0)

## 2024-04-24 LAB — PROCALCITONIN: Procalcitonin: <0.1 ng/mL

## 2024-04-24 LAB — MAGNESIUM: Magnesium: 1.9 mg/dL (ref 1.7–2.4)

## 2024-04-24 MED ORDER — SODIUM CHLORIDE 0.9 % IV SOLN
1.0000 g | INTRAVENOUS | Status: DC
  Administered 2024-04-24 – 2024-04-27 (×4): 1 g via INTRAVENOUS
  Filled 2024-04-24 (×4): qty 10

## 2024-04-24 MED ORDER — QUETIAPINE FUMARATE 25 MG PO TABS
25.0000 mg | ORAL_TABLET | Freq: Two times a day (BID) | ORAL | Status: DC
  Administered 2024-04-24 – 2024-04-27 (×7): 25 mg via ORAL
  Filled 2024-04-24 (×6): qty 1

## 2024-04-24 MED ORDER — POTASSIUM CHLORIDE CRYS ER 20 MEQ PO TBCR
40.0000 meq | EXTENDED_RELEASE_TABLET | Freq: Once | ORAL | Status: AC
  Administered 2024-04-24: 40 meq via ORAL
  Filled 2024-04-24: qty 2

## 2024-04-24 MED ORDER — HYDRALAZINE HCL 50 MG PO TABS
50.0000 mg | ORAL_TABLET | Freq: Three times a day (TID) | ORAL | Status: DC
  Administered 2024-04-24 – 2024-04-27 (×10): 50 mg via ORAL
  Filled 2024-04-24 (×10): qty 1

## 2024-04-24 MED ORDER — DILTIAZEM HCL ER 60 MG PO CP12
120.0000 mg | ORAL_CAPSULE | Freq: Two times a day (BID) | ORAL | Status: DC
  Administered 2024-04-24 (×2): 120 mg via ORAL
  Filled 2024-04-24 (×3): qty 2

## 2024-04-24 NOTE — Progress Notes (Signed)
 Daily Progress Note   Patient Name: EPHREM CARRICK       Date: 04/24/2024 DOB: 1941/05/11  Age: 83 y.o. MRN#: [id number] Attending Physician: Dennise Lavada POUR, MD Primary Care Physician: Dwight Trula SQUIBB, MD Admit Date: 04/21/2024  Reason for Consultation/Follow-up: Establishing goals of care  Subjective: Slightly agitated, confused  Length of Stay: 3  Current Medications: Scheduled Meds:   Chlorhexidine  Gluconate Cloth  6 each Topical Daily   diltiazem   120 mg Oral Q12H   donepezil   10 mg Oral Daily   heparin   5,000 Units Subcutaneous Q8H   hydrALAZINE   50 mg Oral Q8H   memantine   10 mg Oral BID   QUEtiapine   25 mg Oral BID   sertraline   50 mg Oral Daily   sodium chloride  flush  3 mL Intravenous Q12H   tamsulosin   0.4 mg Oral Daily    Continuous Infusions:  cefTRIAXone  (ROCEPHIN )  IV 1 g (04/24/24 0853)    PRN Meds: acetaminophen  **OR** acetaminophen , haloperidol  lactate, hydrALAZINE   Physical Exam Constitutional:      General: He is not in acute distress.    Appearance: He is ill-appearing.  Pulmonary:     Effort: Pulmonary effort is normal.  Skin:    General: Skin is warm and dry.  Neurological:     Mental Status: He is alert. He is disoriented.  Psychiatric:     Comments: agitated             Vital Signs: BP 131/88   Pulse 95   Temp 97.6 F (36.4 C) (Axillary)   Resp 20   SpO2 90%  SpO2: SpO2: 90 % O2 Device: O2 Device: Room Air O2 Flow Rate: O2 Flow Rate (L/min): 3 L/min  Intake/output summary:  Intake/Output Summary (Last 24 hours) at 04/24/2024 1358 Last data filed at 04/24/2024 0654 Gross per 24 hour  Intake --  Output 900 ml  Net -900 ml   LBM:   Baseline Weight:   Most recent weight:         Palliative Assessment/Data: PPS 40%      Patient  Active Problem List   Diagnosis Date Noted   DNR (do not resuscitate) 04/23/2024   Palliative care by specialist 04/23/2024   Severe dementia with agitation (HCC) 04/23/2024   Fall 04/21/2024   Encephalopathy acute 04/09/2024   Dementia with behavioral disturbance (HCC) 04/08/2024   Paroxysmal A-fib (HCC) 04/08/2024   Memory loss 07/04/2018   VI nerve palsy, 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 male  with past medical history of dementia with behavioral disturbances, T2DM, and paroxysmal atrial fibrillation admitted on 04/21/2024 with acute metabolic encephalopathy with possible syncope. Of note patient was admitted 9/10-9/13 for increased confusion and unwitnessed fall. Palliative consulted for goals of care discussion.   Assessment: Follow up today with wife. She has had a chance to review our conversation yesterday with her contact at [facility]. She asks for me to explain the differences of palliative support and hospice support at facility and we did. She was under the impression  she would get more hands on support from palliative - I explained hospice provides more support than palliative in the community. After discussion of differences of each and philosophy of hospice care she opts to have hospice involved at discharge.    I completed a MOST form today. The patient and family outlined their wishes for the following treatment decisions:  Cardiopulmonary Resuscitation: Do Not Attempt Resuscitation (DNR/No CPR)  Medical Interventions: Comfort Measures: Keep clean, warm, and dry. Use medication by any route, positioning, wound care, and other measures to relieve pain and suffering. Use oxygen, suction and manual treatment of airway obstruction as needed for comfort. Do not transfer to the hospital unless comfort needs cannot be met in current  location.  Antibiotics: Determine use of limitation of antibiotics when infection occurs  IV Fluids: No IV fluids (provide other measures to ensure comfort)  Feeding Tube: No feeding tube     Recommendations/Plan: Hospice support at LTC facility - [facility] and hospice liaisons aware - wife requests no discharge over weekend, hopeful for Monday DNR/DNI Continue all other interventions to optimize for discharge  Code Status: DNR  Discharge Planning: Skilled Nursing Facility with Hospice  Care plan was discussed with patient's wife, Dr. Dennise, [facility], hospice liaison  Thank you for allowing the Palliative Medicine Team to assist in the care of this patient.   Total Time 45 minutes Prolonged Time Billed  no   Time spent includes: Detailed review of medical records (labs, imaging, vital signs), medically appropriate exam, discussion with treatment team, counseling and educating patient, family and/or staff, documenting clinical information, medication management and coordination of care.     *Please note that this is a verbal dictation therefore any spelling or grammatical errors are due to the Dragon Medical One system interpretation.  Tobey Jama Barnacle, DNP, [facility] Palliative Medicine Team Team Phone # [phone]  Pager [phone]

## 2024-04-24 NOTE — Progress Notes (Signed)
 Richard Frederick 5W[facility] Liaison Note:   We recently received a referral to provide hospice services for Richard Frederick. He was hospitalized prior to services being started.    Hospital liaisons will follow while he remains in the hospital.   Please call with any hospice related questions.    Eleanor Nail, LPN [facility] Liaison [phone]

## 2024-04-24 NOTE — NC FL2 (Signed)
 [place]  MEDICAID FL2 LEVEL OF CARE FORM     IDENTIFICATION  Patient Name: Richard Frederick Birthdate: January 09, 1941 Sex: male Admission Date (Current Location): 04/21/2024  [facility] and [place] Number:  Producer, television/film/video and Address:  [facility] [place], [address] [place], [place] [address]      Provider Number: [id number]  Attending Physician Name and Address:  Dennise Lavada POUR, MD  Relative Name and Phone Number:  Amen, Staszak (Spouse) [phone] [facility])    Current Level of Care: Hospital Recommended Level of Care: Skilled Nursing Facility Prior Approval Number:    Date Approved/Denied:   PASRR Number: [id number]  Discharge Plan: SNF    Current Diagnoses: Patient Active Problem List   Diagnosis Date Noted   DNR (do not resuscitate) 04/23/2024   Palliative care by specialist 04/23/2024   Severe dementia with agitation (HCC) 04/23/2024   Fall 04/21/2024   Encephalopathy acute 04/09/2024   Dementia with behavioral disturbance (HCC) 04/08/2024   Paroxysmal A-fib (HCC) 04/08/2024   Memory loss 07/04/2018   VI nerve palsy, 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   Vertigo 04/24/2016   Essential hypertension 04/24/2016   DM (diabetes mellitus), type 2 (HCC) 04/24/2016    Orientation RESPIRATION BLADDER Height & Weight     Self  Normal  Incontinent Weight:  180 lb Height:   5'7  BEHAVIORAL SYMPTOMS/MOOD NEUROLOGICAL BOWEL NUTRITION STATUS      Incontinent Diet  AMBULATORY STATUS COMMUNICATION OF NEEDS Skin    Verbally Normal                       Personal Care Assistance Level of Assistance  Bathing, Feeding, Dressing Bathing Assistance: Maximum assistance Feeding assistance: Maximum  assistance Dressing Assistance: Maximum assistance     Functional Limitations Info    Sight Info: Adequate Hearing Info: Adequate Speech Info: Adequate    SPECIAL CARE FACTORS FREQUENCY       Contractures Contractures Info: Not present     Additional Factors Info  Code Status, Allergies Code Status Info: DNR-Limited Allergies Info: Amlodipine, Ativan  (Lorazepam ), Atorvastatin, Pravastatin, Rosuvastatin           Current Medications (04/24/2024):  This is the current hospital active medication list Current Facility-Administered Medications  Medication Dose Route Frequency Provider Last Rate Last Admin   acetaminophen  (TYLENOL ) tablet 650 mg  650 mg Oral Q6H PRN Patel, Ekta V, MD   650 mg at 04/22/24 2056   Or   acetaminophen  (TYLENOL ) suppository 650 mg  650 mg Rectal Q6H PRN Patel, Ekta V, MD       cefTRIAXone  (ROCEPHIN ) 1 g in sodium chloride  0.9 % 100 mL IVPB  1 g Intravenous Q24H Singh, Prashant K, MD 200 mL/hr at 04/24/24 0853 1 g at 04/24/24 0853   Chlorhexidine  Gluconate Cloth 2 % PADS 6 each  6 each Topical Daily Gherghe, Costin M, MD   6 each at 04/24/24 9178   diltiazem  (CARDIZEM  SR) 12 hr capsule 120 mg  120 mg Oral Q12H Singh, Prashant K, MD   120 mg at 04/24/24 9176   donepezil  (ARICEPT ) tablet 10 mg  10 mg Oral Daily Patel, Ekta V, MD   10 mg at 04/24/24 0819   haloperidol  lactate (HALDOL ) injection 5 mg  5 mg Intravenous Q6H PRN Gherghe, Costin M, MD       heparin  injection 5,000 Units  5,000 Units Subcutaneous Q8H Tobie Mario GAILS, MD   5,000 Units at 04/24/24 0532   hydrALAZINE  (  APRESOLINE ) injection 5 mg  5 mg Intravenous Q4H PRN Patel, Ekta V, MD       hydrALAZINE  (APRESOLINE ) tablet 50 mg  50 mg Oral Q8H Singh, Prashant K, MD   50 mg at 04/24/24 0645   memantine  (NAMENDA ) tablet 10 mg  10 mg Oral BID Patel, Ekta V, MD   10 mg at 04/24/24 9180   QUEtiapine  (SEROQUEL ) tablet 25 mg  25 mg Oral BID Singh, Prashant K, MD   25 mg at 04/24/24 9180   sertraline  (ZOLOFT ) tablet 50 mg  50 mg Oral Daily Patel, Ekta V, MD   50 mg at 04/24/24 9180   sodium chloride  flush (NS) 0.9 % injection 3 mL  3 mL Intravenous Q12H Tobie Mario GAILS, MD   3 mL at 04/24/24 9177   tamsulosin  (FLOMAX ) capsule 0.4 mg  0.4 mg Oral Daily  Patel, Ekta V, MD   0.4 mg at 04/24/24 9180     Discharge Medications: Please see discharge summary for a list of discharge medications.  Relevant Imaging Results:  Relevant Lab Results:   Additional Information SSN:[id number]; hospice to follow  Kaelah Hayashi C Ferguson Gertner, LCSWA

## 2024-04-24 NOTE — Progress Notes (Signed)
 PROGRESS NOTE                                                                                                                                                                                                             Patient Demographics:    Richard Frederick, is a 83 y.o. male, DOB - [birth year], FMW:[id number]  Outpatient Primary MD for the patient is Dwight Trula SQUIBB, MD    LOS - 3  Admit date - 04/21/2024    Chief Complaint  Patient presents with   Fall       Brief Narrative (HPI from H&P)   84 y.o. male with past medical history  of diabetes mellitus type 2, essential hypertension, paroxysmal A-fib, dementia with behavioral disturbance Recently admitted on 10 September for similar presentation with fall patient also was confused, along with noted slurred speech and delayed response, patient at baseline noted to be alert and oriented to self.  Patient was discharged on 13 September, was in his nursing home, and the morning of admission was found lying on the ground on the left side at the nursing facility    Subjective:   Patient in bed, appears comfortable, denies any headache, no fever, no chest pain or pressure, no shortness of breath , no abdominal pain. No new focal weakness.   Assessment  & Plan :    Acute metabolic encephalopathy, underlying advanced dementia, possible syncope - patient admitted to the hospital with a fall while he was already at [facility] for ongoing weakness, delirium and falls at home, MRI brain, CT T and L-spine nonacute here, he has longstanding dementia and likely encephalopathy was worse due to urinary retention and UTI.  Continue supportive care, use Haldol  as needed along with Seroquel  and restraints as needed.   A-fib with RVR - he was noted on admission placed on Cardizem  infusion now titrated down to oral Cardizem  dose adjusted on 04/24/2024, mild RVR times due to agitation.  He is a very poor  candidate for coagulation given history of falls, ambulatory dysfunction   Leukocytosis-due to UTI caused by bladder outlet obstruction, growing E. coli, transition to Rocephin  for 5 days on 04/24/2024.   Acute kidney injury -creatinine on presentation 2.0 (had a value of 0.9 before, but the whole BMP looks off).  Likely due to dehydration/poor p.o. intake and also acute  urinary retention.  Continue fluids, creatinine improving   Essential hypertension-continue diltiazem  infusion for now   Advanced dementia-continue donepezil , memantine , Zoloft    Acute urinary retention, BPH-urology saw patient and had to have a Foley catheter due to acute retention, add Flomax .  Urinalysis not that impressive or too concerning for UTI.  Pending cultures   Mild rhabdo-on fluids   Hypokalemia and hypomagnesemia.  Replaced.    GOC - discussed with the wife this afternoon, he has advanced dementia, poor quality of life, refusing care intermittently. Code status changed to DNR  DM 2-A1c in the 5 range.  Lab Results  Component Value Date   HGBA1C 5.4 04/08/2024   CBG (last 3)  No results for input(s): GLUCAP in the last 72 hours.        Condition - Extremely Guarded  Family Communication  :  wife Almarie [phone]  in detail on 04/23/24  Code Status :  DNR  Consults  :  Urology, palliative care for goals of care  PUD Prophylaxis :    Procedures  :     CT T&L Spine - No acute traumatic injury to the thoracic or lumbar spine  MRI - non acute  CT chest abdomen pelvis.  1. No acute traumatic injury to the chest, abdomen or pelvis. 2. Multiple other nonacute observations, as described above.      Disposition Plan  :    Status is: Inpatient  DVT Prophylaxis  :    heparin  injection 5,000 Units Start: 04/21/24 2200    Lab Results  Component Value Date   PLT 341 04/24/2024    Diet :  Diet Order             Diet regular Fluid consistency: Thin  Diet effective now                     Inpatient Medications  Scheduled Meds:  Chlorhexidine  Gluconate Cloth  6 each Topical Daily   diltiazem   120 mg Oral Q12H   donepezil   10 mg Oral Daily   heparin   5,000 Units Subcutaneous Q8H   hydrALAZINE   50 mg Oral Q8H   memantine   10 mg Oral BID   QUEtiapine   25 mg Oral BID   sertraline   50 mg Oral Daily   sodium chloride  flush  3 mL Intravenous Q12H   tamsulosin   0.4 mg Oral Daily   Continuous Infusions:  cefTRIAXone  (ROCEPHIN )  IV     PRN Meds:.acetaminophen  **OR** acetaminophen , haloperidol  lactate, hydrALAZINE     Objective:   Vitals:   04/23/24 2001 04/24/24 0000 04/24/24 0401 04/24/24 0455  BP:   (!) 179/98 (!) 171/78  Pulse:   (!) 111 (!) 109  Resp:   11 (!) 21  Temp: 98.4 F (36.9 C) 97.7 F (36.5 C) 98 F (36.7 C)   TempSrc: Oral Oral Oral   SpO2:   91% 90%    Wt Readings from Last 3 Encounters:  04/12/24 81.6 kg  04/09/24 82.1 kg  03/10/24 82.1 kg     Intake/Output Summary (Last 24 hours) at 04/24/2024 0813 Last data filed at 04/24/2024 0654 Gross per 24 hour  Intake --  Output 900 ml  Net -900 ml     Physical Exam  Awake but confused, No new F.N deficits, Normal affect [place].AT,PERRAL Supple Neck, No JVD,   Symmetrical Chest wall movement, Good air movement bilaterally, CTAB RRR,No Gallops,Rubs or new Murmurs,  +ve B.Sounds, Abd Soft, No tenderness,   No  Cyanosis, Clubbing or edema, foley in place       Data Review:    Recent Labs  Lab 04/21/24 1029 04/21/24 1142 04/22/24 0439 04/23/24 0214 04/24/24 0238  WBC  --  25.4* 21.4* 16.6* 14.5*  HGB 9.2* 15.7 13.7 13.1 14.1  HCT 27.0* 48.1 42.1 40.3 42.9  PLT  --  346 323 298 341  MCV  --  86.8 87.7 87.4 87.4  MCH  --  28.3 28.5 28.4 28.7  MCHC  --  32.6 32.5 32.5 32.9  RDW  --  13.2 13.1 13.0 12.8  LYMPHSABS  --  1.1  --   --  1.9  MONOABS  --  1.5*  --   --  1.2*  EOSABS  --  0.3  --   --  0.7*  BASOSABS  --  0.1  --   --  0.1    Recent Labs  Lab 04/21/24 1029  04/21/24 1142 04/21/24 1641 04/21/24 1642 04/21/24 1705 04/21/24 1928 04/22/24 0439 04/23/24 0214 04/23/24 0757 04/24/24 0238  NA 151* 143  --   --   --   --  144 143  --  143  K 2.0* 3.6  --   --   --   --  3.8 3.3*  --  3.5  CL 120* 101  --   --   --   --  105 106  --  105  CO2  --  26  --   --   --   --  27 26  --  25  ANIONGAP  --  16*  --   --   --   --  12 11  --  13  GLUCOSE 103* 173*  --   --   --   --  148* 133*  --  109*  BUN 37* 62*  --   --   --   --  43* 32*  --  23  CREATININE 0.90 2.03*  --   --   --   --  1.55* 1.35*  --  1.25*  AST  --  42*  --   --   --   --  35 29  --   --   ALT  --  61*  --   --   --   --  50* 49*  --   --   ALKPHOS  --  77  --   --   --   --  66 63  --   --   BILITOT  --  1.1  --   --   --   --  1.0 1.0  --   --   ALBUMIN  --  3.3*  --   --   --   --  2.5* 2.3*  --   --   CRP  --   --   --   --   --   --   --   --  14.1* 11.1*  PROCALCITON  --   --   --   --   --   --   --  <0.10  --  <0.10  LATICACIDVEN  --   --   --   --  1.6 1.5  --   --   --   --   TSH  --   --  4.203  --   --   --   --   --   --   --  MG  --   --   --  1.7  --   --   --  1.6*  --  1.9  CALCIUM  --  10.1  --   --   --   --  9.1 8.8*  --  8.7*      Recent Labs  Lab 04/21/24 1142 04/21/24 1641 04/21/24 1642 04/21/24 1705 04/21/24 1928 04/22/24 0439 04/23/24 0214 04/23/24 0757 04/24/24 0238  CRP  --   --   --   --   --   --   --  14.1* 11.1*  PROCALCITON  --   --   --   --   --   --  <0.10  --  <0.10  LATICACIDVEN  --   --   --  1.6 1.5  --   --   --   --   TSH  --  4.203  --   --   --   --   --   --   --   MG  --   --  1.7  --   --   --  1.6*  --  1.9  CALCIUM 10.1  --   --   --   --  9.1 8.8*  --  8.7*    --------------------------------------------------------------------------------------------------------------- Lab Results  Component Value Date   CHOL 193 04/09/2024   HDL 47 04/09/2024   LDLCALC 131 (H) 04/09/2024   TRIG 76 04/09/2024   CHOLHDL  4.1 04/09/2024    Lab Results  Component Value Date   HGBA1C 5.4 04/08/2024   Recent Labs    04/21/24 1641  TSH 4.203   No results for input(s): VITAMINB12, FOLATE, FERRITIN, TIBC, IRON, RETICCTPCT in the last 72 hours. ------------------------------------------------------------------------------------------------------------------ Cardiac Enzymes No results for input(s): CKMB, TROPONINI, MYOGLOBIN in the last 168 hours.  Invalid input(s): CK  Micro Results Recent Results (from the past 240 hours)  Urine Culture     Status: Abnormal   Collection Time: 04/21/24  1:10 PM   Specimen: Urine, Catheterized  Result Value Ref Range Status   Specimen Description URINE, CATHETERIZED  Final   Special Requests   Final    NONE Performed at [facility], [address] [place], [place] [address]    Culture 50,000 COLONIES/mL ESCHERICHIA COLI (A)  Final   Report Status 04/23/2024 FINAL  Final   Organism ID, Bacteria ESCHERICHIA COLI (A)  Final      Susceptibility   Escherichia coli - MIC*    AMPICILLIN  >=32 RESISTANT Resistant     CEFAZOLIN (URINE) Value in next row Resistant      >=32 RESISTANTThis is a modified FDA-approved test that has been validated and its performance characteristics determined by the reporting laboratory.  This laboratory is certified under the Clinical Laboratory Improvement Amendments CLIA as qualified to perform high complexity clinical laboratory testing.    CEFEPIME Value in next row Sensitive      >=32 RESISTANTThis is a modified FDA-approved test that has been validated and its performance characteristics determined by the reporting laboratory.  This laboratory is certified under the Clinical Laboratory Improvement Amendments CLIA as qualified to perform high complexity clinical laboratory testing.    ERTAPENEM Value in next row Sensitive      >=32 RESISTANTThis is a modified FDA-approved test that has been validated and its  performance characteristics determined by the reporting laboratory.  This laboratory is certified under the Clinical Laboratory Improvement Amendments CLIA as qualified  to perform high complexity clinical laboratory testing.    CEFTRIAXONE  Value in next row Sensitive      >=32 RESISTANTThis is a modified FDA-approved test that has been validated and its performance characteristics determined by the reporting laboratory.  This laboratory is certified under the Clinical Laboratory Improvement Amendments CLIA as qualified to perform high complexity clinical laboratory testing.    CIPROFLOXACIN Value in next row Sensitive      >=32 RESISTANTThis is a modified FDA-approved test that has been validated and its performance characteristics determined by the reporting laboratory.  This laboratory is certified under the Clinical Laboratory Improvement Amendments CLIA as qualified to perform high complexity clinical laboratory testing.    GENTAMICIN Value in next row Sensitive      >=32 RESISTANTThis is a modified FDA-approved test that has been validated and its performance characteristics determined by the reporting laboratory.  This laboratory is certified under the Clinical Laboratory Improvement Amendments CLIA as qualified to perform high complexity clinical laboratory testing.    NITROFURANTOIN Value in next row Sensitive      >=32 RESISTANTThis is a modified FDA-approved test that has been validated and its performance characteristics determined by the reporting laboratory.  This laboratory is certified under the Clinical Laboratory Improvement Amendments CLIA as qualified to perform high complexity clinical laboratory testing.    TRIMETH/SULFA Value in next row Sensitive      >=32 RESISTANTThis is a modified FDA-approved test that has been validated and its performance characteristics determined by the reporting laboratory.  This laboratory is certified under the Clinical Laboratory Improvement Amendments  CLIA as qualified to perform high complexity clinical laboratory testing.    AMPICILLIN /SULBACTAM Value in next row Resistant      >=32 RESISTANTThis is a modified FDA-approved test that has been validated and its performance characteristics determined by the reporting laboratory.  This laboratory is certified under the Clinical Laboratory Improvement Amendments CLIA as qualified to perform high complexity clinical laboratory testing.    PIP/TAZO Value in next row Sensitive      16 SENSITIVEThis is a modified FDA-approved test that has been validated and its performance characteristics determined by the reporting laboratory.  This laboratory is certified under the Clinical Laboratory Improvement Amendments CLIA as qualified to perform high complexity clinical laboratory testing.    MEROPENEM Value in next row Sensitive      16 SENSITIVEThis is a modified FDA-approved test that has been validated and its performance characteristics determined by the reporting laboratory.  This laboratory is certified under the Clinical Laboratory Improvement Amendments CLIA as qualified to perform high complexity clinical laboratory testing.    * 50,000 COLONIES/mL ESCHERICHIA COLI  Blood culture (routine x 2)     Status: None (Preliminary result)   Collection Time: 04/21/24  1:49 PM   Specimen: BLOOD LEFT HAND  Result Value Ref Range Status   Specimen Description BLOOD LEFT HAND  Final   Special Requests   Final    BOTTLES DRAWN AEROBIC AND ANAEROBIC Blood Culture results may not be optimal due to an inadequate volume of blood received in culture bottles   Culture   Final    NO GROWTH 3 DAYS Performed at [facility], [address], [place], [place] [address]    Report Status PENDING  Incomplete  Blood culture (routine x 2)     Status: None (Preliminary result)   Collection Time: 04/22/24  1:34 PM   Specimen: BLOOD RIGHT ARM  Result Value Ref Range Status  Specimen Description BLOOD RIGHT ARM  Final    Special Requests   Final    BOTTLES DRAWN AEROBIC AND ANAEROBIC Blood Culture adequate volume   Culture   Final    NO GROWTH 2 DAYS Performed at [facility], [address], [place], [place] [address]    Report Status PENDING  Incomplete    Radiology Report DG Chest Port 1 View Result Date: 04/23/2024 EXAM: 1 VIEW(S) XRAY OF THE CHEST 04/23/2024 06:35:36 AM COMPARISON: 04/21/2024 CLINICAL HISTORY: SOB (shortness of breath) [id number]. Sob; rover FINDINGS: LUNGS AND PLEURA: Low lung volumes. Basilar atelectasis. No focal pulmonary opacity. No pulmonary edema. No pleural effusion. No pneumothorax. HEART AND MEDIASTINUM: Atherosclerotic calcifications. BONES AND SOFT TISSUES: No acute osseous abnormality. IMPRESSION: 1. No acute cardiopulmonary findings. 2. Low lung volumes with basilar atelectasis. Electronically signed by: Waddell Calk MD 04/23/2024 06:39 AM EDT RP Workstation: [id number]   MR BRAIN WO CONTRAST Result Date: 04/22/2024 EXAM: MRI BRAIN WITHOUT CONTRAST 04/22/2024 10:12:41 AM TECHNIQUE: Multiplanar multisequence MRI of the head/brain was performed without the administration of intravenous contrast. COMPARISON: CT head without contrast 04/21/2024. MR head without contrast 06/23/2018. CLINICAL HISTORY: AMS/ Suspect TIA. Ams dementia. FINDINGS: BRAIN AND VENTRICLES: Moderate atrophy and white matter disease has progressed since the prior exam. Mild white matter changes extend into the brain stem. No acute infarct. No intracranial hemorrhage. No mass. No midline shift. No hydrocephalus. The sella is unremarkable. Normal flow voids. ORBITS: No acute abnormality. SINUSES AND MASTOIDS: No acute abnormality. BONES AND SOFT TISSUES: Normal marrow signal. No acute soft tissue abnormality. IMPRESSION: 1. No acute intracranial abnormality. 2. Moderate atrophy and white matter disease, progressed since the prior exam, with mild white matter changes extending into the brain stem. This most likely  reflects the sequelae of chronic microvascular ischemia. Electronically signed by: Lonni Necessary MD 04/22/2024 10:21 AM EDT RP Workstation: [id number]     Signature  -   Lavada Stank M.D on 04/24/2024 at 8:13 AM   -  To page go to [email or web address]

## 2024-04-24 NOTE — Care Management Important Message (Signed)
 Important Message  Patient Details  Name: Richard Frederick MRN: [id number] Date of Birth: [birth year]   Important Message Given:  Yes - Medicare IM     Claretta Deed 04/24/2024, 3:56 PM

## 2024-04-24 NOTE — Plan of Care (Signed)

## 2024-04-24 NOTE — TOC Initial Note (Signed)
 Transition of Care [facility]) - Initial/Assessment Note    Patient Details  Name: Richard Frederick MRN: [id number] Date of Birth: 11-15-1940  Transition of Care [facility]) CM/SW Contact:    Inocente GORMAN Kindle, LCSW Phone Number: 04/24/2024, 2:13 PM  Clinical Narrative:                 CSW spoke with wife and received request for patient to return to [facility] under private pay with [facility] to follow at SNF (wife is friends with staff there). CSW confirmed with Karrin that patient can return any time with Hospice. [facility] following.   Expected Discharge Plan: Skilled Nursing Facility Barriers to Discharge: Continued Medical Work up   Patient Goals and CMS Choice Patient states their goals for this hospitalization and ongoing recovery are:: Comffort care CMS Medicare.gov Compare Post Acute Care list provided to:: Patient Represenative (must comment) Choice offered to / list presented to : Spouse [place] ownership interest in [facility].provided to:: Spouse    Expected Discharge Plan and Services In-house Referral: Clinical Social Work, Hospice / Palliative Care   Post Acute Care Choice: Skilled Nursing Facility, Hospice Living arrangements for the past 2 months: Single Family Home                                      Prior Living Arrangements/Services Living arrangements for the past 2 months: Single Family Home Lives with:: Spouse Patient language and need for interpreter reviewed:: Yes Do you feel safe going back to the place where you live?: Yes      Need for Family Participation in Patient Care: Yes (Comment) Care giver support system in place?: Yes (comment) Current home services: DME Criminal Activity/Legal Involvement Pertinent to Current Situation/Hospitalization: No - Comment as needed  Activities of Daily Living   ADL Screening (condition at time of admission) Independently performs ADLs?: No Does the patient have a NEW difficulty with  bathing/dressing/toileting/self-feeding that is expected to last >3 days?: No Does the patient have a NEW difficulty with getting in/out of bed, walking, or climbing stairs that is expected to last >3 days?: No Does the patient have a NEW difficulty with communication that is expected to last >3 days?: Yes (Initiates electronic notice to provider for possible SLP consult) Is the patient deaf or have difficulty hearing?: No Does the patient have difficulty seeing, even when wearing glasses/contacts?: Yes Does the patient have difficulty concentrating, remembering, or making decisions?: Yes  Permission Sought/Granted Permission sought to share information with : Facility Medical sales representative, Family Supports Permission granted to share information with : No  Share Information with NAME: Tylar, Amborn [phone]  Permission granted to share info w AGENCY: [facility]        Emotional Assessment Appearance:: Appears stated age Attitude/Demeanor/Rapport: Unable to Assess Affect (typically observed): Unable to Assess Orientation: : Oriented to Self Alcohol / Substance Use: Not Applicable Psych Involvement: No (comment)  Admission diagnosis:  Urinary retention [R33.9] Fall [W19.XXXA] AKI (acute kidney injury) [N17.9] Leukocytosis, unspecified type [D72.829] Patient Active Problem List   Diagnosis Date Noted   DNR (do not resuscitate) 04/23/2024   Palliative care by specialist 04/23/2024   Severe dementia with agitation (HCC) 04/23/2024   Fall 04/21/2024   Encephalopathy acute 04/09/2024   Dementia with behavioral disturbance (HCC) 04/08/2024   Paroxysmal A-fib (HCC) 04/08/2024   Memory loss 07/04/2018   VI nerve palsy, 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

## 2024-04-25 DIAGNOSIS — W19XXXA Unspecified fall, initial encounter: Secondary | ICD-10-CM | POA: Diagnosis not present

## 2024-04-25 DIAGNOSIS — Z66 Do not resuscitate: Secondary | ICD-10-CM

## 2024-04-25 DIAGNOSIS — Z515 Encounter for palliative care: Secondary | ICD-10-CM | POA: Diagnosis not present

## 2024-04-25 DIAGNOSIS — Z7189 Other specified counseling: Secondary | ICD-10-CM

## 2024-04-25 DIAGNOSIS — F03C11 Unspecified dementia, severe, with agitation: Secondary | ICD-10-CM | POA: Diagnosis not present

## 2024-04-25 LAB — CBC WITH DIFFERENTIAL/PLATELET
Abs Immature Granulocytes: 0.14 K/uL — ABNORMAL HIGH (ref 0.00–0.07)
Basophils Absolute: 0.1 K/uL (ref 0.0–0.1)
Basophils Relative: 1 %
Eosinophils Absolute: 0.7 K/uL — ABNORMAL HIGH (ref 0.0–0.5)
Eosinophils Relative: 5 %
HCT: 48.7 % (ref 39.0–52.0)
Hemoglobin: 15.5 g/dL (ref 13.0–17.0)
Immature Granulocytes: 1 %
Lymphocytes Relative: 16 %
Lymphs Abs: 2.4 K/uL (ref 0.7–4.0)
MCH: 28.3 pg (ref 26.0–34.0)
MCHC: 31.8 g/dL (ref 30.0–36.0)
MCV: 89 fL (ref 80.0–100.0)
Monocytes Absolute: 1.1 K/uL — ABNORMAL HIGH (ref 0.1–1.0)
Monocytes Relative: 7 %
Neutro Abs: 10.7 K/uL — ABNORMAL HIGH (ref 1.7–7.7)
Neutrophils Relative %: 70 %
Platelets: 382 K/uL (ref 150–400)
RBC: 5.47 MIL/uL (ref 4.22–5.81)
RDW: 12.9 % (ref 11.5–15.5)
WBC: 15.1 K/uL — ABNORMAL HIGH (ref 4.0–10.5)
nRBC: 0 % (ref 0.0–0.2)

## 2024-04-25 LAB — BASIC METABOLIC PANEL WITH GFR
Anion gap: 13 (ref 5–15)
BUN: 22 mg/dL (ref 8–23)
CO2: 25 mmol/L (ref 22–32)
Calcium: 9.2 mg/dL (ref 8.9–10.3)
Chloride: 107 mmol/L (ref 98–111)
Creatinine, Ser: 1.3 mg/dL — ABNORMAL HIGH (ref 0.61–1.24)
GFR, Estimated: 54 mL/min — ABNORMAL LOW (ref >60)
Glucose, Bld: 123 mg/dL — ABNORMAL HIGH (ref 70–99)
Potassium: 3.9 mmol/L (ref 3.5–5.1)
Sodium: 145 mmol/L (ref 135–145)

## 2024-04-25 LAB — MAGNESIUM: Magnesium: 1.8 mg/dL (ref 1.7–2.4)

## 2024-04-25 LAB — C-REACTIVE PROTEIN: CRP: 8 mg/dL — ABNORMAL HIGH (ref <1.0)

## 2024-04-25 LAB — PROCALCITONIN: Procalcitonin: <0.1 ng/mL

## 2024-04-25 MED ORDER — DILTIAZEM HCL ER COATED BEADS 120 MG PO CP24
240.0000 mg | ORAL_CAPSULE | Freq: Every day | ORAL | Status: DC
  Administered 2024-04-25 – 2024-04-26 (×2): 240 mg via ORAL
  Filled 2024-04-25 (×3): qty 2

## 2024-04-25 MED ORDER — DIGOXIN 0.25 MG/ML IJ SOLN: 0.2500 mg | Freq: Four times a day (QID) | INTRAMUSCULAR | Status: DC

## 2024-04-25 NOTE — Progress Notes (Signed)
 PROGRESS NOTE                                                                                                                                                                                                             Patient Demographics:    Richard Frederick, is a 83 y.o. male, DOB - July 19, 1941, FMW:[id number]  Outpatient Primary MD for the patient is Dwight Trula SQUIBB, MD    LOS - 4  Admit date - 04/21/2024    Chief Complaint  Patient presents with   Fall       Brief Narrative (HPI from H&P)   83 y.o. male with past medical history  of diabetes mellitus type 2, essential hypertension, paroxysmal A-fib, dementia with behavioral disturbance Recently admitted on 10 September for similar presentation with fall patient also was confused, along with noted slurred speech and delayed response, patient at baseline noted to be alert and oriented to self.  Patient was discharged on 13 September, was in his nursing home, and the morning of admission was found lying on the ground on the left side at the nursing facility    Subjective:   Patient in bed, appears comfortable, denies any headache, no fever, no chest pain or pressure, no shortness of breath , no abdominal pain. No new focal weakness.   Assessment  & Plan :   Acute metabolic encephalopathy, underlying advanced dementia, possible syncope - patient admitted to the hospital with a fall while he was already at [facility] for ongoing weakness, delirium and falls at home, MRI brain, CT T and L-spine nonacute here, he has longstanding dementia and likely encephalopathy was worse due to urinary retention and UTI.  Continue supportive care, use Haldol  as needed along with Seroquel  and restraints as needed.   A-fib with RVR - he was noted on admission placed on Cardizem  infusion now titrated down to oral Cardizem  still not in good rate control, increase diltiazem  dose further on 04/25/2024, monitor  heart rate.  He is a very poor candidate for coagulation given history of falls, ambulatory dysfunction   Leukocytosis - due to UTI caused by bladder outlet obstruction, growing E. coli, transition to Rocephin  for 5 days on 04/24/2024.   Acute kidney injury on CKD stage IIIb - baseline creatinine now appears to be close to 1.5, after hydration AKI much improved now  close to baseline.   Essential hypertension - continue diltiazem  infusion for now   Advanced dementia - continue donepezil , memantine , Zoloft    Acute urinary retention, BPH - urology saw patient and had to have a Foley catheter due to acute retention, add Flomax .  Urinalysis not that impressive or too concerning for UTI.  Pending cultures   Mild Rhabdo - on fluids   Hypokalemia and hypomagnesemia.  Replaced.    GOC - discussed with the wife this afternoon, he has advanced dementia, poor quality of life, refusing care intermittently. Code status changed to DNR  DM 2 - A1c in the 5 range.  Lab Results  Component Value Date   HGBA1C 5.4 04/08/2024   CBG (last 3)  No results for input(s): GLUCAP in the last 72 hours.        Condition - Extremely Guarded  Family Communication  :  wife Almarie [phone]  in detail on 04/23/24  Code Status :  DNR  Consults  :  Urology, palliative care for goals of care  PUD Prophylaxis :    Procedures  :     CT T&L Spine - No acute traumatic injury to the thoracic or lumbar spine  MRI - non acute  CT chest abdomen pelvis.  1. No acute traumatic injury to the chest, abdomen or pelvis. 2. Multiple other nonacute observations, as described above.      Disposition Plan  :    Status is: Inpatient  DVT Prophylaxis  :    heparin  injection 5,000 Units Start: 04/21/24 2200    Lab Results  Component Value Date   PLT 382 04/25/2024    Diet :  Diet Order             Diet regular Fluid consistency: Thin  Diet effective now                    Inpatient  Medications  Scheduled Meds:  Chlorhexidine  Gluconate Cloth  6 each Topical Daily   diltiazem   120 mg Oral Q12H   donepezil   10 mg Oral Daily   heparin   5,000 Units Subcutaneous Q8H   hydrALAZINE   50 mg Oral Q8H   memantine   10 mg Oral BID   QUEtiapine   25 mg Oral BID   sertraline   50 mg Oral Daily   sodium chloride  flush  3 mL Intravenous Q12H   tamsulosin   0.4 mg Oral Daily   Continuous Infusions:  cefTRIAXone  (ROCEPHIN )  IV 1 g (04/24/24 0853)   PRN Meds:.acetaminophen  **OR** acetaminophen , haloperidol  lactate, hydrALAZINE     Objective:   Vitals:   04/24/24 0800 04/24/24 1236 04/24/24 2041 04/25/24 0811  BP: (!) 114/103 131/88 (!) 160/96 123/75  Pulse: (!) 124 95 85 (!) 104  Resp: (!) 21 20 19    Temp:  97.6 F (36.4 C) 98.2 F (36.8 C) (!) 97.5 F (36.4 C)  TempSrc:  Axillary Axillary Axillary  SpO2: 90%       Wt Readings from Last 3 Encounters:  04/12/24 81.6 kg  04/09/24 82.1 kg  03/10/24 82.1 kg     Intake/Output Summary (Last 24 hours) at 04/25/2024 0849 Last data filed at 04/25/2024 0540 Gross per 24 hour  Intake 100 ml  Output 1050 ml  Net -950 ml     Physical Exam  Awake but confused, No new F.N deficits, Normal affect [place].AT,PERRAL Supple Neck, No JVD,   Symmetrical Chest wall movement, Good air movement bilaterally, CTAB RRR,No Gallops,Rubs or new Murmurs,  +  ve B.Sounds, Abd Soft, No tenderness,   No Cyanosis, Clubbing or edema, foley in place       Data Review:    Recent Labs  Lab 04/21/24 1142 04/22/24 0439 04/23/24 0214 04/24/24 0238 04/25/24 0206  WBC 25.4* 21.4* 16.6* 14.5* 15.1*  HGB 15.7 13.7 13.1 14.1 15.5  HCT 48.1 42.1 40.3 42.9 48.7  PLT 346 323 298 341 382  MCV 86.8 87.7 87.4 87.4 89.0  MCH 28.3 28.5 28.4 28.7 28.3  MCHC 32.6 32.5 32.5 32.9 31.8  RDW 13.2 13.1 13.0 12.8 12.9  LYMPHSABS 1.1  --   --  1.9 2.4  MONOABS 1.5*  --   --  1.2* 1.1*  EOSABS 0.3  --   --  0.7* 0.7*  BASOSABS 0.1  --   --  0.1 0.1     Recent Labs  Lab 04/21/24 1142 04/21/24 1641 04/21/24 1642 04/21/24 1705 04/21/24 1928 04/22/24 0439 04/23/24 0214 04/23/24 0757 04/24/24 0238 04/25/24 0206  NA 143  --   --   --   --  144 143  --  143 145  K 3.6  --   --   --   --  3.8 3.3*  --  3.5 3.9  CL 101  --   --   --   --  105 106  --  105 107  CO2 26  --   --   --   --  27 26  --  25 25  ANIONGAP 16*  --   --   --   --  12 11  --  13 13  GLUCOSE 173*  --   --   --   --  148* 133*  --  109* 123*  BUN 62*  --   --   --   --  43* 32*  --  23 22  CREATININE 2.03*  --   --   --   --  1.55* 1.35*  --  1.25* 1.30*  AST 42*  --   --   --   --  35 29  --   --   --   ALT 61*  --   --   --   --  50* 49*  --   --   --   ALKPHOS 77  --   --   --   --  66 63  --   --   --   BILITOT 1.1  --   --   --   --  1.0 1.0  --   --   --   ALBUMIN 3.3*  --   --   --   --  2.5* 2.3*  --   --   --   CRP  --   --   --   --   --   --   --  14.1* 11.1* 8.0*  PROCALCITON  --   --   --   --   --   --  <0.10  --  <0.10 <0.10  LATICACIDVEN  --   --   --  1.6 1.5  --   --   --   --   --   TSH  --  4.203  --   --   --   --   --   --   --   --   MG  --   --  1.7  --   --   --  1.6*  --  1.9 1.8  CALCIUM 10.1  --   --   --   --  9.1 8.8*  --  8.7* 9.2      Recent Labs  Lab 04/21/24 1142 04/21/24 1641 04/21/24 1642 04/21/24 1705 04/21/24 1928 04/22/24 0439 04/23/24 0214 04/23/24 0757 04/24/24 0238 04/25/24 0206  CRP  --   --   --   --   --   --   --  14.1* 11.1* 8.0*  PROCALCITON  --   --   --   --   --   --  <0.10  --  <0.10 <0.10  LATICACIDVEN  --   --   --  1.6 1.5  --   --   --   --   --   TSH  --  4.203  --   --   --   --   --   --   --   --   MG  --   --  1.7  --   --   --  1.6*  --  1.9 1.8  CALCIUM 10.1  --   --   --   --  9.1 8.8*  --  8.7* 9.2    --------------------------------------------------------------------------------------------------------------- Lab Results  Component Value Date   CHOL 193 04/09/2024   HDL 47  04/09/2024   LDLCALC 131 (H) 04/09/2024   TRIG 76 04/09/2024   CHOLHDL 4.1 04/09/2024    Lab Results  Component Value Date   HGBA1C 5.4 04/08/2024   No results for input(s): TSH, T4TOTAL, FREET4, T3FREE, THYROIDAB in the last 72 hours.  No results for input(s): VITAMINB12, FOLATE, FERRITIN, TIBC, IRON, RETICCTPCT in the last 72 hours. ------------------------------------------------------------------------------------------------------------------ Cardiac Enzymes No results for input(s): CKMB, TROPONINI, MYOGLOBIN in the last 168 hours.  Invalid input(s): CK  Micro Results Recent Results (from the past 240 hours)  Urine Culture     Status: Abnormal   Collection Time: 04/21/24  1:10 PM   Specimen: Urine, Catheterized  Result Value Ref Range Status   Specimen Description URINE, CATHETERIZED  Final   Special Requests   Final    NONE Performed at [facility], [address] [place], [place] [address]    Culture 50,000 COLONIES/mL ESCHERICHIA COLI (A)  Final   Report Status 04/23/2024 FINAL  Final   Organism ID, Bacteria ESCHERICHIA COLI (A)  Final      Susceptibility   Escherichia coli - MIC*    AMPICILLIN  >=32 RESISTANT Resistant     CEFAZOLIN (URINE) Value in next row Resistant      >=32 RESISTANTThis is a modified FDA-approved test that has been validated and its performance characteristics determined by the reporting laboratory.  This laboratory is certified under the Clinical Laboratory Improvement Amendments CLIA as qualified to perform high complexity clinical laboratory testing.    CEFEPIME Value in next row Sensitive      >=32 RESISTANTThis is a modified FDA-approved test that has been validated and its performance characteristics determined by the reporting laboratory.  This laboratory is certified under the Clinical Laboratory Improvement Amendments CLIA as qualified to perform high complexity clinical laboratory testing.     ERTAPENEM Value in next row Sensitive      >=32 RESISTANTThis is a modified FDA-approved test that has been validated and its performance characteristics determined by the reporting laboratory.  This laboratory is certified under the Clinical Laboratory Improvement Amendments CLIA as qualified to perform high complexity clinical laboratory  testing.    CEFTRIAXONE  Value in next row Sensitive      >=32 RESISTANTThis is a modified FDA-approved test that has been validated and its performance characteristics determined by the reporting laboratory.  This laboratory is certified under the Clinical Laboratory Improvement Amendments CLIA as qualified to perform high complexity clinical laboratory testing.    CIPROFLOXACIN Value in next row Sensitive      >=32 RESISTANTThis is a modified FDA-approved test that has been validated and its performance characteristics determined by the reporting laboratory.  This laboratory is certified under the Clinical Laboratory Improvement Amendments CLIA as qualified to perform high complexity clinical laboratory testing.    GENTAMICIN Value in next row Sensitive      >=32 RESISTANTThis is a modified FDA-approved test that has been validated and its performance characteristics determined by the reporting laboratory.  This laboratory is certified under the Clinical Laboratory Improvement Amendments CLIA as qualified to perform high complexity clinical laboratory testing.    NITROFURANTOIN Value in next row Sensitive      >=32 RESISTANTThis is a modified FDA-approved test that has been validated and its performance characteristics determined by the reporting laboratory.  This laboratory is certified under the Clinical Laboratory Improvement Amendments CLIA as qualified to perform high complexity clinical laboratory testing.    TRIMETH/SULFA Value in next row Sensitive      >=32 RESISTANTThis is a modified FDA-approved test that has been validated and its performance  characteristics determined by the reporting laboratory.  This laboratory is certified under the Clinical Laboratory Improvement Amendments CLIA as qualified to perform high complexity clinical laboratory testing.    AMPICILLIN /SULBACTAM Value in next row Resistant      >=32 RESISTANTThis is a modified FDA-approved test that has been validated and its performance characteristics determined by the reporting laboratory.  This laboratory is certified under the Clinical Laboratory Improvement Amendments CLIA as qualified to perform high complexity clinical laboratory testing.    PIP/TAZO Value in next row Sensitive      16 SENSITIVEThis is a modified FDA-approved test that has been validated and its performance characteristics determined by the reporting laboratory.  This laboratory is certified under the Clinical Laboratory Improvement Amendments CLIA as qualified to perform high complexity clinical laboratory testing.    MEROPENEM Value in next row Sensitive      16 SENSITIVEThis is a modified FDA-approved test that has been validated and its performance characteristics determined by the reporting laboratory.  This laboratory is certified under the Clinical Laboratory Improvement Amendments CLIA as qualified to perform high complexity clinical laboratory testing.    * 50,000 COLONIES/mL ESCHERICHIA COLI  Blood culture (routine x 2)     Status: None (Preliminary result)   Collection Time: 04/21/24  1:49 PM   Specimen: BLOOD LEFT HAND  Result Value Ref Range Status   Specimen Description BLOOD LEFT HAND  Final   Special Requests   Final    BOTTLES DRAWN AEROBIC AND ANAEROBIC Blood Culture results may not be optimal due to an inadequate volume of blood received in culture bottles   Culture   Final    NO GROWTH 4 DAYS Performed at [facility], [address], [place], [place] [address]    Report Status PENDING  Incomplete  Blood culture (routine x 2)     Status: None (Preliminary result)    Collection Time: 04/22/24  1:34 PM   Specimen: BLOOD RIGHT ARM  Result Value Ref Range Status   Specimen Description BLOOD  RIGHT ARM  Final   Special Requests   Final    BOTTLES DRAWN AEROBIC AND ANAEROBIC Blood Culture adequate volume   Culture   Final    NO GROWTH 3 DAYS Performed at [facility], [address] [place], [place] [address]    Report Status PENDING  Incomplete    Radiology Report No results found.    Signature  -   Lavada Stank M.D on 04/25/2024 at 8:49 AM   -  To page go to [email or web address]

## 2024-04-25 NOTE — Plan of Care (Signed)

## 2024-04-25 NOTE — Plan of Care (Signed)
  Problem: Clinical Measurements: °Goal: Diagnostic test results will improve °Outcome: Progressing °  °Problem: Elimination: °Goal: Will not experience complications related to urinary retention °Outcome: Progressing °  °

## 2024-04-26 DIAGNOSIS — F03C11 Unspecified dementia, severe, with agitation: Secondary | ICD-10-CM | POA: Diagnosis not present

## 2024-04-26 DIAGNOSIS — Z515 Encounter for palliative care: Secondary | ICD-10-CM | POA: Diagnosis not present

## 2024-04-26 DIAGNOSIS — Z7189 Other specified counseling: Secondary | ICD-10-CM | POA: Diagnosis not present

## 2024-04-26 DIAGNOSIS — W19XXXA Unspecified fall, initial encounter: Secondary | ICD-10-CM | POA: Diagnosis not present

## 2024-04-26 LAB — CULTURE, BLOOD (ROUTINE X 2): Culture: NO GROWTH

## 2024-04-26 LAB — CBC WITH DIFFERENTIAL/PLATELET
Abs Immature Granulocytes: 0.09 K/uL — ABNORMAL HIGH (ref 0.00–0.07)
Basophils Absolute: 0.1 K/uL (ref 0.0–0.1)
Basophils Relative: 1 %
Eosinophils Absolute: 0.6 K/uL — ABNORMAL HIGH (ref 0.0–0.5)
Eosinophils Relative: 5 %
HCT: 46.3 % (ref 39.0–52.0)
Hemoglobin: 15 g/dL (ref 13.0–17.0)
Immature Granulocytes: 1 %
Lymphocytes Relative: 15 %
Lymphs Abs: 2 K/uL (ref 0.7–4.0)
MCH: 28.5 pg (ref 26.0–34.0)
MCHC: 32.4 g/dL (ref 30.0–36.0)
MCV: 88 fL (ref 80.0–100.0)
Monocytes Absolute: 0.9 K/uL (ref 0.1–1.0)
Monocytes Relative: 7 %
Neutro Abs: 9.9 K/uL — ABNORMAL HIGH (ref 1.7–7.7)
Neutrophils Relative %: 71 %
Platelets: 374 K/uL (ref 150–400)
RBC: 5.26 MIL/uL (ref 4.22–5.81)
RDW: 13.1 % (ref 11.5–15.5)
WBC: 13.6 K/uL — ABNORMAL HIGH (ref 4.0–10.5)
nRBC: 0 % (ref 0.0–0.2)

## 2024-04-26 LAB — BASIC METABOLIC PANEL WITH GFR
Anion gap: 9 (ref 5–15)
BUN: 25 mg/dL — ABNORMAL HIGH (ref 8–23)
CO2: 24 mmol/L (ref 22–32)
Calcium: 8.9 mg/dL (ref 8.9–10.3)
Chloride: 108 mmol/L (ref 98–111)
Creatinine, Ser: 1.35 mg/dL — ABNORMAL HIGH (ref 0.61–1.24)
GFR, Estimated: 52 mL/min — ABNORMAL LOW (ref >60)
Glucose, Bld: 121 mg/dL — ABNORMAL HIGH (ref 70–99)
Potassium: 3.7 mmol/L (ref 3.5–5.1)
Sodium: 141 mmol/L (ref 135–145)

## 2024-04-26 LAB — PROCALCITONIN: Procalcitonin: <0.1 ng/mL

## 2024-04-26 LAB — C-REACTIVE PROTEIN: CRP: 4 mg/dL — ABNORMAL HIGH (ref <1.0)

## 2024-04-26 LAB — MAGNESIUM: Magnesium: 1.7 mg/dL (ref 1.7–2.4)

## 2024-04-26 MED ORDER — LACTATED RINGERS IV SOLN: INTRAVENOUS | Status: AC

## 2024-04-26 MED ORDER — CARVEDILOL 3.125 MG PO TABS
3.1250 mg | ORAL_TABLET | Freq: Two times a day (BID) | ORAL | Status: DC
  Administered 2024-04-26 (×2): 3.125 mg via ORAL
  Filled 2024-04-26 (×3): qty 1

## 2024-04-26 NOTE — Progress Notes (Signed)
 PROGRESS NOTE                                                                                                                                                                                                             Patient Demographics:    Richard Frederick, is a 83 y.o. male, DOB - 1941-01-04, FMW:[id number]  Outpatient Primary MD for the patient is Dwight Trula SQUIBB, MD    LOS - 5  Admit date - 04/21/2024    Chief Complaint  Patient presents with   Fall       Brief Narrative (HPI from H&P)   83 y.o. male with past medical history  of diabetes mellitus type 2, essential hypertension, paroxysmal A-fib, dementia with behavioral disturbance Recently admitted on 10 September for similar presentation with fall patient also was confused, along with noted slurred speech and delayed response, patient at baseline noted to be alert and oriented to self.  Patient was discharged on 13 September, was in his nursing home, and the morning of admission was found lying on the ground on the left side at the nursing facility    Subjective:   Patient in bed, appears comfortable, denies any headache, no fever, no chest pain or pressure, no shortness of breath , no abdominal pain. No focal weakness.   Assessment  & Plan :   Acute metabolic encephalopathy, underlying advanced dementia, possible syncope - patient admitted to the hospital with a fall while he was already at [facility] for ongoing weakness, delirium and falls at home, MRI brain, CT T and L-spine nonacute here, he has longstanding dementia and likely encephalopathy was worse due to urinary retention and UTI.  Continue supportive care, use Haldol  as needed along with Seroquel  and restraints as needed.   A-fib with RVR - he was noted on admission placed on Cardizem  infusion now titrated down to oral Cardizem  still not in good rate control, increase diltiazem  dose further on 04/25/2024, monitor heart  rate.  He is a very poor candidate for coagulation given history of falls, ambulatory dysfunction   Leukocytosis - due to UTI caused by bladder outlet obstruction, growing E. coli, transition to Rocephin  for 5 days on 04/24/2024.   Acute kidney injury on CKD stage IIIb - baseline creatinine now appears to be close to 1.5, after hydration AKI much improved now close  to baseline.   Essential hypertension - continue diltiazem  infusion for now   Advanced dementia - continue donepezil , memantine , Zoloft    Acute urinary retention, BPH - urology saw patient and had to have a Foley catheter due to acute retention, add Flomax .  Urinalysis not that impressive or too concerning for UTI.  Pending cultures   Mild Rhabdo - on fluids   Hypokalemia and hypomagnesemia.  Replaced.    GOC - discussed with the wife this afternoon, he has advanced dementia, poor quality of life, refusing care intermittently. Code status changed to DNR  DM 2 - A1c in the 5 range.  Lab Results  Component Value Date   HGBA1C 5.4 04/08/2024   CBG (last 3)  No results for input(s): GLUCAP in the last 72 hours.        Condition - Extremely Guarded  Family Communication  :  wife Almarie [phone]  in detail on 04/23/24, 04/25/2024  Code Status :  DNR  Consults  :  Urology, palliative care for goals of care  PUD Prophylaxis :    Procedures  :     CT T&L Spine - No acute traumatic injury to the thoracic or lumbar spine  MRI - non acute  CT chest abdomen pelvis.  1. No acute traumatic injury to the chest, abdomen or pelvis. 2. Multiple other nonacute observations, as described above.      Disposition Plan  :    Status is: Inpatient  DVT Prophylaxis  :    heparin  injection 5,000 Units Start: 04/21/24 2200    Lab Results  Component Value Date   PLT 374 04/26/2024    Diet :  Diet Order             Diet regular Fluid consistency: Thin  Diet effective now                    Inpatient  Medications  Scheduled Meds:  carvedilol  3.125 mg Oral BID WC   Chlorhexidine  Gluconate Cloth  6 each Topical Daily   diltiazem   240 mg Oral Daily   donepezil   10 mg Oral Daily   heparin   5,000 Units Subcutaneous Q8H   hydrALAZINE   50 mg Oral Q8H   memantine   10 mg Oral BID   QUEtiapine   25 mg Oral BID   sertraline   50 mg Oral Daily   sodium chloride  flush  3 mL Intravenous Q12H   tamsulosin   0.4 mg Oral Daily   Continuous Infusions:  cefTRIAXone  (ROCEPHIN )  IV 1 g (04/25/24 1029)   PRN Meds:.acetaminophen  **OR** acetaminophen , haloperidol  lactate, hydrALAZINE     Objective:   Vitals:   04/25/24 1300 04/25/24 1600 04/25/24 1945 04/26/24 0000  BP: 127/82 118/70 128/73   Pulse:  100 (!) 105   Resp:  19 20   Temp:  98.5 F (36.9 C) 98.8 F (37.1 C) (!) 97.2 F (36.2 C)  TempSrc:  Axillary Oral Oral  SpO2:  100% 99%     Wt Readings from Last 3 Encounters:  04/12/24 81.6 kg  04/09/24 82.1 kg  03/10/24 82.1 kg    No intake or output data in the 24 hours ending 04/26/24 0729    Physical Exam  Awake but confused, No new F.N deficits, Normal affect [place].AT,PERRAL Supple Neck, No JVD,   Symmetrical Chest wall movement, Good air movement bilaterally, CTAB RRR,No Gallops,Rubs or new Murmurs,  +ve B.Sounds, Abd Soft, No tenderness,   No Cyanosis, Clubbing or edema, foley  in place       Data Review:    Recent Labs  Lab 04/21/24 1142 04/22/24 0439 04/23/24 0214 04/24/24 0238 04/25/24 0206 04/26/24 0321  WBC 25.4* 21.4* 16.6* 14.5* 15.1* 13.6*  HGB 15.7 13.7 13.1 14.1 15.5 15.0  HCT 48.1 42.1 40.3 42.9 48.7 46.3  PLT 346 323 298 341 382 374  MCV 86.8 87.7 87.4 87.4 89.0 88.0  MCH 28.3 28.5 28.4 28.7 28.3 28.5  MCHC 32.6 32.5 32.5 32.9 31.8 32.4  RDW 13.2 13.1 13.0 12.8 12.9 13.1  LYMPHSABS 1.1  --   --  1.9 2.4 2.0  MONOABS 1.5*  --   --  1.2* 1.1* 0.9  EOSABS 0.3  --   --  0.7* 0.7* 0.6*  BASOSABS 0.1  --   --  0.1 0.1 0.1    Recent Labs  Lab  04/21/24 1142 04/21/24 1641 04/21/24 1642 04/21/24 1705 04/21/24 1928 04/22/24 0439 04/23/24 0214 04/23/24 0757 04/24/24 0238 04/25/24 0206 04/26/24 0321  NA 143  --   --   --   --  144 143  --  143 145 141  K 3.6  --   --   --   --  3.8 3.3*  --  3.5 3.9 3.7  CL 101  --   --   --   --  105 106  --  105 107 108  CO2 26  --   --   --   --  27 26  --  25 25 24   ANIONGAP 16*  --   --   --   --  12 11  --  13 13 9   GLUCOSE 173*  --   --   --   --  148* 133*  --  109* 123* 121*  BUN 62*  --   --   --   --  43* 32*  --  23 22 25*  CREATININE 2.03*  --   --   --   --  1.55* 1.35*  --  1.25* 1.30* 1.35*  AST 42*  --   --   --   --  35 29  --   --   --   --   ALT 61*  --   --   --   --  50* 49*  --   --   --   --   ALKPHOS 77  --   --   --   --  66 63  --   --   --   --   BILITOT 1.1  --   --   --   --  1.0 1.0  --   --   --   --   ALBUMIN 3.3*  --   --   --   --  2.5* 2.3*  --   --   --   --   CRP  --   --   --   --   --   --   --  14.1* 11.1* 8.0* 4.0*  PROCALCITON  --   --   --   --   --   --  <0.10  --  <0.10 <0.10 <0.10  LATICACIDVEN  --   --   --  1.6 1.5  --   --   --   --   --   --   TSH  --  4.203  --   --   --   --   --   --   --   --   --  MG  --   --  1.7  --   --   --  1.6*  --  1.9 1.8 1.7  CALCIUM 10.1  --   --   --   --  9.1 8.8*  --  8.7* 9.2 8.9      Recent Labs  Lab 04/21/24 1641 04/21/24 1642 04/21/24 1705 04/21/24 1928 04/22/24 0439 04/23/24 0214 04/23/24 0757 04/24/24 0238 04/25/24 0206 04/26/24 0321  CRP  --   --   --   --   --   --  14.1* 11.1* 8.0* 4.0*  PROCALCITON  --   --   --   --   --  <0.10  --  <0.10 <0.10 <0.10  LATICACIDVEN  --   --  1.6 1.5  --   --   --   --   --   --   TSH 4.203  --   --   --   --   --   --   --   --   --   MG  --  1.7  --   --   --  1.6*  --  1.9 1.8 1.7  CALCIUM  --   --   --   --  9.1 8.8*  --  8.7* 9.2 8.9     --------------------------------------------------------------------------------------------------------------- Lab Results  Component Value Date   CHOL 193 04/09/2024   HDL 47 04/09/2024   LDLCALC 131 (H) 04/09/2024   TRIG 76 04/09/2024   CHOLHDL 4.1 04/09/2024    Lab Results  Component Value Date   HGBA1C 5.4 04/08/2024   No results for input(s): TSH, T4TOTAL, FREET4, T3FREE, THYROIDAB in the last 72 hours.  No results for input(s): VITAMINB12, FOLATE, FERRITIN, TIBC, IRON, RETICCTPCT in the last 72 hours. ------------------------------------------------------------------------------------------------------------------ Cardiac Enzymes No results for input(s): CKMB, TROPONINI, MYOGLOBIN in the last 168 hours.  Invalid input(s): CK  Micro Results Recent Results (from the past 240 hours)  Urine Culture     Status: Abnormal   Collection Time: 04/21/24  1:10 PM   Specimen: Urine, Catheterized  Result Value Ref Range Status   Specimen Description URINE, CATHETERIZED  Final   Special Requests   Final    NONE Performed at [facility], [address] [place], [place] [address]    Culture 50,000 COLONIES/mL ESCHERICHIA COLI (A)  Final   Report Status 04/23/2024 FINAL  Final   Organism ID, Bacteria ESCHERICHIA COLI (A)  Final      Susceptibility   Escherichia coli - MIC*    AMPICILLIN  >=32 RESISTANT Resistant     CEFAZOLIN (URINE) Value in next row Resistant      >=32 RESISTANTThis is a modified FDA-approved test that has been validated and its performance characteristics determined by the reporting laboratory.  This laboratory is certified under the Clinical Laboratory Improvement Amendments CLIA as qualified to perform high complexity clinical laboratory testing.    CEFEPIME Value in next row Sensitive      >=32 RESISTANTThis is a modified FDA-approved test that has been validated and its performance characteristics determined by the  reporting laboratory.  This laboratory is certified under the Clinical Laboratory Improvement Amendments CLIA as qualified to perform high complexity clinical laboratory testing.    ERTAPENEM Value in next row Sensitive      >=32 RESISTANTThis is a modified FDA-approved test that has been validated and its performance characteristics determined by the reporting laboratory.  This laboratory is certified under the  Clinical Laboratory Improvement Amendments CLIA as qualified to perform high complexity clinical laboratory testing.    CEFTRIAXONE  Value in next row Sensitive      >=32 RESISTANTThis is a modified FDA-approved test that has been validated and its performance characteristics determined by the reporting laboratory.  This laboratory is certified under the Clinical Laboratory Improvement Amendments CLIA as qualified to perform high complexity clinical laboratory testing.    CIPROFLOXACIN Value in next row Sensitive      >=32 RESISTANTThis is a modified FDA-approved test that has been validated and its performance characteristics determined by the reporting laboratory.  This laboratory is certified under the Clinical Laboratory Improvement Amendments CLIA as qualified to perform high complexity clinical laboratory testing.    GENTAMICIN Value in next row Sensitive      >=32 RESISTANTThis is a modified FDA-approved test that has been validated and its performance characteristics determined by the reporting laboratory.  This laboratory is certified under the Clinical Laboratory Improvement Amendments CLIA as qualified to perform high complexity clinical laboratory testing.    NITROFURANTOIN Value in next row Sensitive      >=32 RESISTANTThis is a modified FDA-approved test that has been validated and its performance characteristics determined by the reporting laboratory.  This laboratory is certified under the Clinical Laboratory Improvement Amendments CLIA as qualified to perform high complexity  clinical laboratory testing.    TRIMETH/SULFA Value in next row Sensitive      >=32 RESISTANTThis is a modified FDA-approved test that has been validated and its performance characteristics determined by the reporting laboratory.  This laboratory is certified under the Clinical Laboratory Improvement Amendments CLIA as qualified to perform high complexity clinical laboratory testing.    AMPICILLIN /SULBACTAM Value in next row Resistant      >=32 RESISTANTThis is a modified FDA-approved test that has been validated and its performance characteristics determined by the reporting laboratory.  This laboratory is certified under the Clinical Laboratory Improvement Amendments CLIA as qualified to perform high complexity clinical laboratory testing.    PIP/TAZO Value in next row Sensitive      16 SENSITIVEThis is a modified FDA-approved test that has been validated and its performance characteristics determined by the reporting laboratory.  This laboratory is certified under the Clinical Laboratory Improvement Amendments CLIA as qualified to perform high complexity clinical laboratory testing.    MEROPENEM Value in next row Sensitive      16 SENSITIVEThis is a modified FDA-approved test that has been validated and its performance characteristics determined by the reporting laboratory.  This laboratory is certified under the Clinical Laboratory Improvement Amendments CLIA as qualified to perform high complexity clinical laboratory testing.    * 50,000 COLONIES/mL ESCHERICHIA COLI  Blood culture (routine x 2)     Status: None (Preliminary result)   Collection Time: 04/21/24  1:49 PM   Specimen: BLOOD LEFT HAND  Result Value Ref Range Status   Specimen Description BLOOD LEFT HAND  Final   Special Requests   Final    BOTTLES DRAWN AEROBIC AND ANAEROBIC Blood Culture results may not be optimal due to an inadequate volume of blood received in culture bottles   Culture   Final    NO GROWTH 4 DAYS Performed at  [facility], [address], [place], [place] [address]    Report Status PENDING  Incomplete  Blood culture (routine x 2)     Status: None (Preliminary result)   Collection Time: 04/22/24  1:34 PM   Specimen: BLOOD  RIGHT ARM  Result Value Ref Range Status   Specimen Description BLOOD RIGHT ARM  Final   Special Requests   Final    BOTTLES DRAWN AEROBIC AND ANAEROBIC Blood Culture adequate volume   Culture   Final    NO GROWTH 3 DAYS Performed at [facility], [address], [place], [place] [address]    Report Status PENDING  Incomplete    Radiology Report No results found.    Signature  -   Lavada Stank M.D on 04/26/2024 at 7:29 AM   -  To page go to [email or web address]

## 2024-04-26 NOTE — Plan of Care (Signed)
   Problem: Clinical Measurements: Goal: Will remain free from infection Outcome: Progressing Goal: Diagnostic test results will improve Outcome: Progressing

## 2024-04-27 DIAGNOSIS — I10 Essential (primary) hypertension: Secondary | ICD-10-CM | POA: Diagnosis not present

## 2024-04-27 DIAGNOSIS — W19XXXA Unspecified fall, initial encounter: Secondary | ICD-10-CM | POA: Diagnosis not present

## 2024-04-27 LAB — CULTURE, BLOOD (ROUTINE X 2)
Culture: NO GROWTH
Special Requests: ADEQUATE

## 2024-04-27 MED ORDER — DILTIAZEM HCL ER COATED BEADS 180 MG PO CP24
300.0000 mg | ORAL_CAPSULE | Freq: Every day | ORAL | Status: DC
  Administered 2024-04-27: 300 mg via ORAL
  Filled 2024-04-27: qty 1

## 2024-04-27 MED ORDER — CEPHALEXIN 500 MG PO CAPS: 500.0000 mg | ORAL_CAPSULE | Freq: Three times a day (TID) | ORAL | Status: DC

## 2024-04-27 MED ORDER — INFLUENZA VAC SPLIT HIGH-DOSE 0.5 ML IM SUSY
0.5000 mL | PREFILLED_SYRINGE | INTRAMUSCULAR | Status: AC | PRN
  Administered 2024-04-27: 0.5 mL via INTRAMUSCULAR
  Filled 2024-04-27: qty 0.5

## 2024-04-27 MED ORDER — CARVEDILOL 6.25 MG PO TABS
6.2500 mg | ORAL_TABLET | Freq: Two times a day (BID) | ORAL | Status: DC
  Administered 2024-04-27: 6.25 mg via ORAL
  Filled 2024-04-27: qty 1

## 2024-04-27 MED ORDER — DILTIAZEM HCL 25 MG/5ML IV SOLN: 10.0000 mg | Freq: Four times a day (QID) | INTRAVENOUS | Status: DC | PRN

## 2024-04-27 MED ORDER — CARVEDILOL 6.25 MG PO TABS: 6.2500 mg | ORAL_TABLET | Freq: Two times a day (BID) | ORAL | Status: AC

## 2024-04-27 MED ORDER — NITROFURANTOIN MONOHYD MACRO 100 MG PO CAPS: 100.0000 mg | ORAL_CAPSULE | Freq: Two times a day (BID) | ORAL | Status: AC

## 2024-04-27 MED ORDER — DILTIAZEM HCL ER COATED BEADS 300 MG PO CP24: 300.0000 mg | ORAL_CAPSULE | Freq: Every day | ORAL | Status: AC

## 2024-04-27 NOTE — Discharge Instructions (Signed)
 Follow with Primary MD Dwight Trula SQUIBB, MD in 7 days   Get CBC, CMP, Magnesium , 2 view Chest X ray -  checked next visit with your primary MD or SNF MD    Activity: As tolerated with Full fall precautions use walker/cane & assistance as needed  Disposition SNF  Diet: Heart Healthy    Special Instructions: If you have smoked or chewed Tobacco  in the last 2 yrs please stop smoking, stop any regular Alcohol  and or any Recreational drug use.  On your next visit with your primary care physician please Get Medicines reviewed and adjusted.  Please request your Prim.MD to go over all Hospital Tests and Procedure/Radiological results at the follow up, please get all Hospital records sent to your Prim MD by signing hospital release before you go home.  If you experience worsening of your admission symptoms, develop shortness of breath, life threatening emergency, suicidal or homicidal thoughts you must seek medical attention immediately by calling 911 or calling your MD immediately  if symptoms less severe.  You Must read complete instructions/literature along with all the possible adverse reactions/side effects for all the Medicines you take and that have been prescribed to you. Take any new Medicines after you have completely understood and accpet all the possible adverse reactions/side effects.   Do not drive when taking Pain medications.  Do not take more than prescribed Pain, Sleep and Anxiety Medications  Wear Seat belts while driving.

## 2024-04-27 NOTE — Plan of Care (Signed)
 Came to check on Richard Frederick in advance of his voiding trial tomorrow morning.  I see that he is scheduled to discharge to hospice services today.  There is a moderate to high chance he will go back into urinary retention.  If the care team wishes to keep Foley catheter in place for palliation, defer voiding trial at this time.  Please feel free to contact [facility] with questions or concerns.  If catheter remains in place.  Please exchange every 30 days, preferably with coud.  [facility] [address] second floor [place], [place]  [address] [phone]

## 2024-04-27 NOTE — Progress Notes (Signed)
 Handoff given to [facility].

## 2024-04-27 NOTE — TOC Transition Note (Signed)
 Transition of Care [facility]) - Discharge Note   Patient Details  Name: Richard Frederick MRN: [id number] Date of Birth: Dec 18, 1940  Transition of [facility] CM/SW Contact:  Almarie CHRISTELLA Goodie, LCSW Phone Number: 04/27/2024, 11:29 AM   Clinical Narrative:   CSW sent discharge information to [facility], confirmed receipt and they are ready for patient. CSW spoke with spouse, Almarie, she is in agreement. Transport arranged with [facility] for next available.  Nurse to call report to [phone], Room 317A    Final next level of care: Skilled Nursing Facility Barriers to Discharge: Barriers Resolved   Patient Goals and CMS Choice Patient states their goals for this hospitalization and ongoing recovery are:: Comffort care CMS Medicare.gov Compare Post Acute Care list provided to:: Patient Represenative (must comment) Choice offered to / list presented to : Spouse [place] ownership interest in [facility].provided to:: Spouse    Discharge Placement              Patient chooses bed at: [facility] Patient to be transferred to facility by: [facility] Name of family member notified: Almarie Patient and family notified of of transfer: 04/27/24  Discharge Plan and Services Additional resources added to the After Visit Summary for   In-house Referral: Clinical Social Work, Hospice / Palliative Care   Post Acute Care Choice: Skilled Nursing Facility, Hospice                               Social Drivers of Health (SDOH) Interventions SDOH Screenings   Food Insecurity: Patient Unable To Answer (04/22/2024)  Housing: Low Risk  (04/22/2024)  Transportation Needs: No Transportation Needs (04/22/2024)  Utilities: Not At Risk (04/22/2024)  Social Connections: Socially Integrated (04/22/2024)  Tobacco Use: Medium Risk (04/12/2024)     Readmission Risk Interventions     No data to display

## 2024-04-27 NOTE — Progress Notes (Signed)
 [id number] [facility] Liaison Note  Received a referral for hospice services at facility after discharge from [facility]. Spoke with Almarie, spouse, on the telephone to initiate education related to hospice philosophy, services and team approach to care. Family verbalized understanding of information given.  Per discussion, the plan is for discharge to [facility] via EMS/[facility] with hospice on Monday, 9.29.  Per family, patient has no DME needs at this time.  Almarie Raja, spouse, [phone] is the family contact.  Please send signed and completed DNR home with the patient.  Please provide prescriptions at discharge as needed to ensure ongoing symptom management.  [facility] information and contact numbers given to [place].  Above information shared with Transitions of Care Manager.  Please call with any questions or concerns.  Thank you for the opportunity to participate in this patient's care.  Inocente Jacobs, BSN, RN [employer] [phone]

## 2024-04-27 NOTE — Discharge Summary (Addendum)
 Richard Frederick FMW:[id number] DOB: 23-Oct-1940 DOA: 04/21/2024  PCP: Dwight Trula SQUIBB, MD  Admit date: 04/21/2024  Discharge date: 04/27/2024  Admitted From: SNF   Disposition:  SNF   Recommendations for Outpatient Follow-up:   Follow up with PCP in 1-2 weeks  PCP Please obtain BMP/CBC, 2 view CXR in 1week,  (see Discharge instructions)   PCP Please follow up on the following pending results: Kindly arrange for urology follow-up within 1 week of discharge.   Home Health: None Equipment/Devices: Indwelling Foley catheter needs to be changed on or before 04/18/2024 Consultations: None  Discharge Condition: Stable    CODE STATUS: DNR Diet Recommendation: Heart Healthy     Chief Complaint  Patient presents with   Fall     Brief history of present illness from the day of admission and additional interim summary    83 y.o. male with past medical history  of diabetes mellitus type 2, essential hypertension, paroxysmal A-fib, dementia with behavioral disturbance Recently admitted on 10 September for similar presentation with fall patient also was confused, along with noted slurred speech and delayed response, patient at baseline noted to be alert and oriented to self.  Patient was discharged on 13 September, was in his nursing home, and the morning of admission was found lying on the ground on the left side at the nursing facility                                                                  Hospital Course   Acute metabolic encephalopathy, underlying advanced dementia, possible syncope - patient admitted to the hospital with a fall while he was already at [facility] for ongoing weakness, delirium and falls at home, MRI brain, CT T and L-spine nonacute here, he has longstanding dementia and likely acute encephalopathy was due  to urinary retention for several days if not weeks and UTI.  With supportive care and catheter placement his mentation is back to baseline.   A-fib with RVR - he was really on Cardizem  drip now on combination of Cardizem  and Coreg.  He is a very poor candidate for anti-coagulation given history of falls, ambulatory dysfunction, post discharge follow-up with cardiology in 7 to 10 days.  Risks benefits discussed with wife agrees with the plan.   Leukocytosis - due to UTI caused by bladder outlet obstruction, growing E. coli, was on Rocephin  here, finish  PO ABX on 04/28/2024.   Acute kidney injury on CKD stage IIIb - baseline creatinine now appears to be close to 1.5, after hydration AKI much improved now close to baseline.   Essential hypertension -stable on present dose of diltiazem  and Coreg.   Advanced dementia - continue donepezil , memantine , Zoloft    Acute urinary retention, BPH - urology saw patient and had to have a  Foley catheter due to acute retention,   Flomax .  Arrange for outpatient urology follow-up within 7 to 10 days of discharge.   GOC -DNR, continue medical care, if significant decline then focus on comfort care.   DM 2 - A1c in the 5 range.  Diet controlled.    Discharge diagnosis     Principal Problem:   Fall Active Problems:   Essential hypertension   DM (diabetes mellitus), type 2 (HCC)   Paroxysmal A-fib (HCC)   DNR (do not resuscitate)   Palliative care by specialist   Severe dementia with agitation [facility])    Discharge instructions    Discharge Instructions     Diet - low sodium heart healthy   Complete by: As directed    Discharge instructions   Complete by: As directed    Follow with Primary MD Dwight Trula SQUIBB, MD in 7 days   Get CBC, CMP, Magnesium , 2 view Chest X ray -  checked next visit with your primary MD or SNF MD    Activity: As tolerated with Full fall precautions use walker/cane & assistance as needed  Disposition SNF  Diet: Heart  Healthy    Special Instructions: If you have smoked or chewed Tobacco  in the last 2 yrs please stop smoking, stop any regular Alcohol  and or any Recreational drug use.  On your next visit with your primary care physician please Get Medicines reviewed and adjusted.  Please request your Prim.MD to go over all Hospital Tests and Procedure/Radiological results at the follow up, please get all Hospital records sent to your Prim MD by signing hospital release before you go home.  If you experience worsening of your admission symptoms, develop shortness of breath, life threatening emergency, suicidal or homicidal thoughts you must seek medical attention immediately by calling 911 or calling your MD immediately  if symptoms less severe.  You Must read complete instructions/literature along with all the possible adverse reactions/side effects for all the Medicines you take and that have been prescribed to you. Take any new Medicines after you have completely understood and accpet all the possible adverse reactions/side effects.   Do not drive when taking Pain medications.  Do not take more than prescribed Pain, Sleep and Anxiety Medications  Wear Seat belts while driving.   Increase activity slowly   Complete by: As directed        Discharge Medications   Allergies as of 04/27/2024       Reactions   Amlodipine Other (See Comments)   Edema   Ativan  [lorazepam ] Other (See Comments)   Agitation, paradoxic reaction   Atorvastatin Other (See Comments)   Achy legs   Pravastatin Other (See Comments)   Achy legs   Rosuvastatin Other (See Comments)   Achy legs        Medication List     STOP taking these medications    ENEMA RE   lisinopril -hydrochlorothiazide  20-12.5 MG tablet Commonly known as: ZESTORETIC    metoprolol  succinate 25 MG 24 hr tablet Commonly known as: TOPROL -XL       TAKE these medications    acetaminophen  325 MG tablet Commonly known as: TYLENOL  Take 650 mg  by mouth every 4 (four) hours as needed for mild pain (pain score 1-3) or moderate pain (pain score 4-6).   bisacodyl 10 MG suppository Commonly known as: DULCOLAX Place 10 mg rectally daily as needed for moderate constipation.   carvedilol 6.25 MG tablet Commonly known as: COREG Take 1 tablet (  6.25 mg total) by mouth 2 (two) times daily with a meal.   clopidogrel  75 MG tablet Commonly known as: PLAVIX  Take 1 tablet (75 mg total) by mouth daily. Please request future refills from PCP.   diltiazem  300 MG 24 hr capsule Commonly known as: CARDIZEM  CD Take 1 capsule (300 mg total) by mouth daily.   donepezil  10 MG tablet Commonly known as: ARICEPT  Take 10 mg by mouth daily.   ezetimibe  10 MG tablet Commonly known as: ZETIA  Take 10 mg by mouth at bedtime.   melatonin 3 MG Tabs tablet Take 2 tablets (6 mg total) by mouth at bedtime.   memantine  10 MG tablet Commonly known as: NAMENDA  Take 10 mg by mouth 2 (two) times daily.   metFORMIN  500 MG tablet Commonly known as: GLUCOPHAGE  Take 500 mg by mouth daily with breakfast.   Milk of Magnesia 1200 MG/15ML suspension Generic drug: magnesium  hydroxide Take 30 mLs by mouth daily as needed for mild constipation or moderate constipation (no bm in 3 days).   niacin  50 MG tablet Take 50 mg by mouth in the morning.   nitrofurantoin (macrocrystal-monohydrate) 100 MG capsule Commonly known as: Macrobid Take 1 capsule (100 mg total) by mouth 2 (two) times daily for 4 days.   PreserVision AREDS 2 Caps Take 2 capsules by mouth daily.   QUEtiapine  25 MG tablet Commonly known as: SEROQUEL  Take 1 tablet (25 mg total) by mouth at bedtime.   sertraline  50 MG tablet Commonly known as: ZOLOFT  Take 50 mg by mouth daily.   tamsulosin  0.4 MG Caps capsule Commonly known as: FLOMAX  Take 0.4 mg by mouth daily.         Contact information for follow-up providers     Dwight Trula SQUIBB, MD. Schedule an appointment as soon as possible for a  visit in 1 week(s).   Specialty: Internal Medicine Contact information: [address] [place] [address] [phone]         Alvaro Ricardo KATHEE Mickey., MD. Schedule an appointment as soon as possible for a visit in 1 week(s).   Specialty: Urology Contact information: [address] [place] [address] [phone]         Jeffrie Oneil BROCKS, MD. Schedule an appointment as soon as possible for a visit in 1 week(s).   Specialty: Cardiology Contact information: [address] [place] [address] [phone]              Contact information for after-discharge care     Destination     [facility], [place] .   Service: Skilled Nursing Contact information: [address] [place]  [address] [phone]                     Major procedures and Radiology Reports - PLEASE review detailed and final reports thoroughly  -      DG Chest Port 1 View Result Date: 04/23/2024 EXAM: 1 VIEW(S) XRAY OF THE CHEST 04/23/2024 06:35:36 AM COMPARISON: 04/21/2024 CLINICAL HISTORY: SOB (shortness of breath) 141880. Sob; rover FINDINGS: LUNGS AND PLEURA: Low lung volumes. Basilar atelectasis. No focal pulmonary opacity. No pulmonary edema. No pleural effusion. No pneumothorax. HEART AND MEDIASTINUM: Atherosclerotic calcifications. BONES AND SOFT TISSUES: No acute osseous abnormality. IMPRESSION: 1. No acute cardiopulmonary findings. 2. Low lung volumes with basilar atelectasis. Electronically signed by: Waddell Calk MD 04/23/2024 06:39 AM EDT RP Workstation: [id number]   MR BRAIN WO CONTRAST Result Date: 04/22/2024 EXAM: MRI  BRAIN WITHOUT CONTRAST 04/22/2024 10:12:41 AM TECHNIQUE: Multiplanar multisequence MRI of the head/brain was performed without the administration of intravenous contrast. COMPARISON: CT head without contrast 04/21/2024. MR head without contrast 06/23/2018. CLINICAL HISTORY: AMS/ Suspect TIA. Ams dementia.  FINDINGS: BRAIN AND VENTRICLES: Moderate atrophy and white matter disease has progressed since the prior exam. Mild white matter changes extend into the brain stem. No acute infarct. No intracranial hemorrhage. No mass. No midline shift. No hydrocephalus. The sella is unremarkable. Normal flow voids. ORBITS: No acute abnormality. SINUSES AND MASTOIDS: No acute abnormality. BONES AND SOFT TISSUES: Normal marrow signal. No acute soft tissue abnormality. IMPRESSION: 1. No acute intracranial abnormality. 2. Moderate atrophy and white matter disease, progressed since the prior exam, with mild white matter changes extending into the brain stem. This most likely reflects the sequelae of chronic microvascular ischemia. Electronically signed by: Lonni Necessary MD 04/22/2024 10:21 AM EDT RP Workstation: [id number]   CT T-SPINE NO CHARGE Result Date: 04/21/2024 CLINICAL DATA:  Status post fall.  Altered mental status. EXAM: CT Thoracic and Lumbar spine with contrast TECHNIQUE: Multiplanar CT images of the thoracic and lumbar spine were reconstructed from contemporary CT of the Chest, Abdomen, and Pelvis. RADIATION DOSE REDUCTION: This exam was performed according to the departmental dose-optimization program which includes automated exposure control, adjustment of the mA and/or kV according to patient size and/or use of iterative reconstruction technique. CONTRAST:  None or No additional COMPARISON:  None Available. FINDINGS: CT THORACIC SPINE FINDINGS Alignment: Normal. Vertebrae: There is diffuse osteopenia of the visualized osseous structures. No acute fracture or focal pathologic process. Paraspinal and other soft tissues: Negative. Disc levels: Intervertebral disc heights are maintained. Mild facet arthropathy marginal osteophyte formation. CT LUMBAR SPINE FINDINGS Segmentation: 5 lumbar type vertebrae. Alignment: Normal. Vertebrae: No acute fracture or focal pathologic process. Paraspinal and other soft tissues:  Negative. Disc levels: Intervertebral disc heights are maintained. Mild multilevel marginal osteophyte formation. IMPRESSION: *No acute traumatic injury to the thoracic or lumbar spine. Electronically Signed   By: Ree Molt M.D.   On: 04/21/2024 13:08   CT L-SPINE NO CHARGE Result Date: 04/21/2024 CLINICAL DATA:  Status post fall.  Altered mental status. EXAM: CT Thoracic and Lumbar spine with contrast TECHNIQUE: Multiplanar CT images of the thoracic and lumbar spine were reconstructed from contemporary CT of the Chest, Abdomen, and Pelvis. RADIATION DOSE REDUCTION: This exam was performed according to the departmental dose-optimization program which includes automated exposure control, adjustment of the mA and/or kV according to patient size and/or use of iterative reconstruction technique. CONTRAST:  None or No additional COMPARISON:  None Available. FINDINGS: CT THORACIC SPINE FINDINGS Alignment: Normal. Vertebrae: There is diffuse osteopenia of the visualized osseous structures. No acute fracture or focal pathologic process. Paraspinal and other soft tissues: Negative. Disc levels: Intervertebral disc heights are maintained. Mild facet arthropathy marginal osteophyte formation. CT LUMBAR SPINE FINDINGS Segmentation: 5 lumbar type vertebrae. Alignment: Normal. Vertebrae: No acute fracture or focal pathologic process. Paraspinal and other soft tissues: Negative. Disc levels: Intervertebral disc heights are maintained. Mild multilevel marginal osteophyte formation. IMPRESSION: *No acute traumatic injury to the thoracic or lumbar spine. Electronically Signed   By: Ree Molt M.D.   On: 04/21/2024 13:08   CT Head Wo Contrast Result Date: 04/21/2024 CLINICAL DATA:  Head trauma, minor (Age >= 65y); Neck trauma (Age >= 65y). Status post fall. Altered mental status. EXAM: CT HEAD WITHOUT CONTRAST CT CERVICAL SPINE WITHOUT CONTRAST TECHNIQUE: Multidetector CT imaging of the head and  cervical spine was  performed following the standard protocol without intravenous contrast. Multiplanar CT image reconstructions of the cervical spine were also generated. RADIATION DOSE REDUCTION: This exam was performed according to the departmental dose-optimization program which includes automated exposure control, adjustment of the mA and/or kV according to patient size and/or use of iterative reconstruction technique. COMPARISON:  CT scan head and cervical spine from 04/08/2024. FINDINGS: CT HEAD FINDINGS Brain: No evidence of acute infarction, hemorrhage, hydrocephalus, extra-axial collection or mass lesion/mass effect. There is bilateral periventricular hypodensity, which is non-specific but most likely seen in the settings of microvascular ischemic changes. Moderate in extent. Otherwise normal appearance of brain parenchyma. Ventricles are normal. Cerebral volume is age appropriate. Vascular: No hyperdense vessel or unexpected calcification. Intracranial arteriosclerosis. Skull: Normal. Negative for fracture or focal lesion. Sinuses/Orbits: No acute finding. Other: Visualized mastoid air cells are unremarkable. No mastoid effusion. CT CERVICAL SPINE FINDINGS Alignment: Mild/grade 1 retrolisthesis of C3 over C4 and mild/grade 1 anterolisthesis of C7 over T1, most likely degenerative in essentially similar to the prior study. This examination does not assess for ligamentous injury or stability. Skull base and vertebrae: No acute fracture. No primary bone lesion or focal pathologic process. Soft tissues and spinal canal: No prevertebral fluid or swelling. No visible canal hematoma. Disc levels: Mild multilevel changes characterized by reduced intervertebral disc height, facet arthropathy marginal osteophyte formation. Upper chest: Negative. Other: None. IMPRESSION: 1. No acute intracranial abnormality. 2. No acute osseous injury or traumatic listhesis of the cervical spine. Electronically Signed   By: Ree Molt M.D.   On:  04/21/2024 13:03   CT Cervical Spine Wo Contrast Result Date: 04/21/2024 CLINICAL DATA:  Head trauma, minor (Age >= 65y); Neck trauma (Age >= 65y). Status post fall. Altered mental status. EXAM: CT HEAD WITHOUT CONTRAST CT CERVICAL SPINE WITHOUT CONTRAST TECHNIQUE: Multidetector CT imaging of the head and cervical spine was performed following the standard protocol without intravenous contrast. Multiplanar CT image reconstructions of the cervical spine were also generated. RADIATION DOSE REDUCTION: This exam was performed according to the departmental dose-optimization program which includes automated exposure control, adjustment of the mA and/or kV according to patient size and/or use of iterative reconstruction technique. COMPARISON:  CT scan head and cervical spine from 04/08/2024. FINDINGS: CT HEAD FINDINGS Brain: No evidence of acute infarction, hemorrhage, hydrocephalus, extra-axial collection or mass lesion/mass effect. There is bilateral periventricular hypodensity, which is non-specific but most likely seen in the settings of microvascular ischemic changes. Moderate in extent. Otherwise normal appearance of brain parenchyma. Ventricles are normal. Cerebral volume is age appropriate. Vascular: No hyperdense vessel or unexpected calcification. Intracranial arteriosclerosis. Skull: Normal. Negative for fracture or focal lesion. Sinuses/Orbits: No acute finding. Other: Visualized mastoid air cells are unremarkable. No mastoid effusion. CT CERVICAL SPINE FINDINGS Alignment: Mild/grade 1 retrolisthesis of C3 over C4 and mild/grade 1 anterolisthesis of C7 over T1, most likely degenerative in essentially similar to the prior study. This examination does not assess for ligamentous injury or stability. Skull base and vertebrae: No acute fracture. No primary bone lesion or focal pathologic process. Soft tissues and spinal canal: No prevertebral fluid or swelling. No visible canal hematoma. Disc levels: Mild  multilevel changes characterized by reduced intervertebral disc height, facet arthropathy marginal osteophyte formation. Upper chest: Negative. Other: None. IMPRESSION: 1. No acute intracranial abnormality. 2. No acute osseous injury or traumatic listhesis of the cervical spine. Electronically Signed   By: Ree Molt M.D.   On: 04/21/2024 13:03   CT  CHEST ABDOMEN PELVIS W CONTRAST Result Date: 04/21/2024 CLINICAL DATA:  Polytrauma, blunt. Fall. Altered mental status. History of dementia. EXAM: CT CHEST, ABDOMEN, AND PELVIS WITH CONTRAST TECHNIQUE: Multidetector CT imaging of the chest, abdomen and pelvis was performed following the standard protocol during bolus administration of intravenous contrast. RADIATION DOSE REDUCTION: This exam was performed according to the departmental dose-optimization program which includes automated exposure control, adjustment of the mA and/or kV according to patient size and/or use of iterative reconstruction technique. CONTRAST:  75mL OMNIPAQUE  IOHEXOL  350 MG/ML SOLN COMPARISON:  None Available. FINDINGS: CT CHEST FINDINGS Cardiovascular: Normal cardiac size. No pericardial effusion. No aortic aneurysm. There are coronary artery calcifications, in keeping with coronary artery disease. There are also moderate peripheral atherosclerotic vascular calcifications of thoracic aorta and its major branches. Mediastinum/Nodes: Visualized thyroid gland appears grossly unremarkable. No solid / cystic mediastinal masses. The esophagus is nondistended precluding optimal assessment. No axillary, mediastinal or hilar lymphadenopathy by size criteria. Lungs/Pleura: The central tracheo-bronchial tree is patent. There is mild, smooth, circumferential thickening of the segmental and subsegmental bronchial walls, throughout bilateral lungs, which is nonspecific. Findings are most commonly seen with bronchitis or reactive airway disease, such as asthma. There is mosaic attenuation of lungs,  consistent with heterogeneous air trapping related to small airways disease. There are patchy areas of linear, plate-like atelectasis and/or scarring throughout bilateral lungs. No mass or consolidation. No pleural effusion or pneumothorax. No suspicious lung nodules. Musculoskeletal: The visualized soft tissues of the chest wall are grossly unremarkable. No suspicious osseous lesions. There are mild multilevel degenerative changes in the visualized spine. CT ABDOMEN PELVIS FINDINGS Hepatobiliary: The liver is normal in size. Non-cirrhotic configuration. No suspicious mass. There are 2, sub 5 mm, hypoattenuating foci in the left hepatic lobe, which are too small to adequately characterize. No intrahepatic or extrahepatic bile duct dilation. No calcified gallstones. Normal gallbladder wall thickness. No pericholecystic inflammatory changes. Pancreas: Unremarkable. No pancreatic ductal dilatation or surrounding inflammatory changes. Spleen: Within normal limits. No focal lesion. Adrenals/Urinary Tract: Adrenal glands are unremarkable. No suspicious renal mass. Bilateral mild hydronephrosis noted, right more than left. However, no hydroureter. Findings are likely due to distended bladder. No nephroureterolithiasis. Unremarkable urinary bladder. Stomach/Bowel: No disproportionate dilation of the small or large bowel loops. No evidence of abnormal bowel wall thickening or inflammatory changes. The appendix is unremarkable. There is a small diverticulum arising from the second part of duodenum. Vascular/Lymphatic: No ascites or pneumoperitoneum. No abdominal or pelvic lymphadenopathy, by size criteria. No aneurysmal dilation of the major abdominal arteries. There are moderate peripheral atherosclerotic vascular calcifications of the aorta and its major branches. Reproductive: Enlarged prostate. Symmetric seminal vesicles. Other: There are fat containing umbilical and bilateral inguinal hernias. The soft tissues and  abdominal wall are otherwise unremarkable. Musculoskeletal: No suspicious osseous lesions. There are mild multilevel degenerative changes in the visualized spine. IMPRESSION: 1. No acute traumatic injury to the chest, abdomen or pelvis. 2. Multiple other nonacute observations, as described above. Electronically Signed   By: Ree Molt M.D.   On: 04/21/2024 12:57   DG Pelvis Portable Result Date: 04/21/2024 CLINICAL DATA:  Unwitnessed fall. EXAM: PORTABLE PELVIS 1-2 VIEWS COMPARISON:  April 09, 2024 FINDINGS: There is no evidence of an acute pelvic fracture or diastasis. No pelvic bone lesions are seen. IMPRESSION: No acute osseous abnormality. Electronically Signed   By: Suzen Dials M.D.   On: 04/21/2024 10:49   DG Chest Portable 1 View Result Date: 04/21/2024 CLINICAL DATA:  Unwitnessed fall.  EXAM: PORTABLE CHEST 1 VIEW COMPARISON:  April 08, 2024 FINDINGS: The heart size and mediastinal contours are within normal limits. There is marked severity calcification of the thoracic aorta. Low lung volumes are noted with mild, stable elevation of the right hemidiaphragm. Mild, stable linear scarring and/or atelectasis is seen within the bilateral lung bases. No pleural effusion or pneumothorax is identified. No acute osseous abnormalities are identified. IMPRESSION: Low lung volumes with mild, stable bibasilar linear scarring and/or atelectasis. Electronically Signed   By: Suzen Dials M.D.   On: 04/21/2024 10:48   ECHOCARDIOGRAM COMPLETE Result Date: 04/09/2024    ECHOCARDIOGRAM REPORT   Patient Name:   KHARON HIXON Date of Exam: 04/09/2024 Medical Rec #:  [id number]        Height:       67.0 in Accession #:    [id number]       Weight:       181.0 lb Date of Birth:  1941/07/12        BSA:          1.938 m Patient Age:    83 years         BP:           188/115 mmHg Patient Gender: M                HR:           100 bpm. Exam Location:  Inpatient Procedure: 2D Echo, Cardiac Doppler and  Color Doppler (Both Spectral and Color            Flow Doppler were utilized during procedure). Indications:    TIA  History:        Patient has prior history of Echocardiogram examinations, most                 recent 07/18/2018. TIA, Arrythmias:Atrial Fibrillation; Risk                 Factors:Hypertension, Diabetes and Former Smoker.  Sonographer:    Juliene Rucks Referring Phys: [id number] ANASTASSIA DOUTOVA  Sonographer Comments: No parasternal window, no subcostal window and Technically difficult study due to poor echo windows. Image acquisition challenging due to patient behavioral factors. and Image acquisition challenging due to uncooperative patient. IMPRESSIONS  1. Left ventricular ejection fraction, by estimation, is 55 to 60%. The left ventricle has normal function. Left ventricular diastolic function could not be evaluated.  2. Right ventricular systolic function was not well visualized. The right ventricular size is not well visualized.  3. The mitral valve was not well visualized.  4. The aortic valve was not well visualized. FINDINGS  Left Ventricle: Left ventricular ejection fraction, by estimation, is 55 to 60%. The left ventricle has normal function. Suboptimal image quality limits for assessment of left ventricular hypertrophy. Left ventricular diastolic function could not be evaluated. Right Ventricle: The right ventricular size is not well visualized. Right vetricular wall thickness was not assessed. Right ventricular systolic function was not well visualized. Left Atrium: Left atrial size was not assessed. Right Atrium: Right atrial size was not assessed. Pericardium: Presence of epicardial fat layer. Mitral Valve: The mitral valve was not well visualized. Tricuspid Valve: The tricuspid valve is not well visualized. Aortic Valve: The aortic valve was not well visualized. Pulmonic Valve: The pulmonic valve was not well visualized. Aorta: The aortic root was not well visualized and the aortic arch was  not well visualized. IAS/Shunts: The interatrial septum was not assessed. Kardie Tobb  DO Electronically signed by Dub Huntsman DO Signature Date/Time: 04/09/2024/5:14:29 PM    Final    VAS US  CAROTID Result Date: 04/09/2024 Carotid Arterial Duplex Study Patient Name:  JAYVIAN ESCOE  Date of Exam:   04/09/2024 Medical Rec #: [id number]         Accession #:    [id number] Date of Birth: 1941/01/27         Patient Gender: M Patient Age:   79 years Exam Location:  [facility] Procedure:      VAS US  CAROTID Referring Phys: BLEASE DOUTOVA --------------------------------------------------------------------------------  Indications:  TIA. Risk Factors: Hypertension, Diabetes. Limitations   Today's exam was limited due to the patient's inability or               unwillingness to cooperate and unable to lie still. Performing Technologist: Elmarie Lindau, RVT  Examination Guidelines: A complete evaluation includes B-mode imaging, spectral Doppler, color Doppler, and power Doppler as needed of all accessible portions of each vessel. Bilateral testing is considered an integral part of a complete examination. Limited examinations for reoccurring indications may be performed as noted.  Right Carotid Findings: +----------+--------+--------+--------+--------------------------+--------+           PSV cm/sEDV cm/sStenosisPlaque Description        Comments +----------+--------+--------+--------+--------------------------+--------+ CCA Prox  111                                                        +----------+--------+--------+--------+--------------------------+--------+ CCA Distal99      10              smooth and heterogenous            +----------+--------+--------+--------+--------------------------+--------+ ICA Prox  59      9       1-39%   irregular and heterogenous         +----------+--------+--------+--------+--------------------------+--------+ ICA Mid   56      9                                                   +----------+--------+--------+--------+--------------------------+--------+ ICA Distal47      10                                                 +----------+--------+--------+--------+--------------------------+--------+ ECA       64                                                         +----------+--------+--------+--------+--------------------------+--------+ +----------+--------+-------+----------------+-------------------+           PSV cm/sEDV cmsDescribe        Arm Pressure (mmHG) +----------+--------+-------+----------------+-------------------+ [id number]            Multiphasic, WNL                    +----------+--------+-------+----------------+-------------------+ +---------+--------+--+--------+-+---------+ VertebralPSV cm/s37EDV cm/s8Antegrade +---------+--------+--+--------+-+---------+ High resistive waveforms  seen throughout Left Carotid Findings: +----------+--------+--------+--------+--------------------------+--------+           PSV cm/sEDV cm/sStenosisPlaque Description        Comments +----------+--------+--------+--------+--------------------------+--------+ CCA Prox  85      9                                                  +----------+--------+--------+--------+--------------------------+--------+ CCA Distal69      9                                                  +----------+--------+--------+--------+--------------------------+--------+ ICA Prox  50      14      1-39%   irregular and heterogenous         +----------+--------+--------+--------+--------------------------+--------+ ICA Distal46      7                                                  +----------+--------+--------+--------+--------------------------+--------+ ECA       67                                                         +----------+--------+--------+--------+--------------------------+--------+  +----------+--------+--------+----------------+-------------------+           PSV cm/sEDV cm/sDescribe        Arm Pressure (mmHG) +----------+--------+--------+----------------+-------------------+ Subclavian101             Multiphasic, WNL                    +----------+--------+--------+----------------+-------------------+ +---------+--------+--+--------+-+---------+ VertebralPSV cm/s36EDV cm/s7Antegrade +---------+--------+--+--------+-+---------+ High resistive waveforms seen throughout  Summary: Right Carotid: Velocities in the right ICA are consistent with a 1-39% stenosis. Left Carotid: Velocities in the left ICA are consistent with a 1-39% stenosis. Vertebrals:  Bilateral vertebral arteries demonstrate antegrade flow. Subclavians: Normal flow hemodynamics were seen in bilateral subclavian              arteries. *See table(s) above for measurements and observations.  Electronically signed by Eather Popp MD on 04/09/2024 at 3:20:49 PM.    Final    DG Abd 1 View Result Date: 04/09/2024 EXAM: 1 VIEW XRAY OF THE ABDOMEN 04/09/2024 10:27:00 AM COMPARISON: None available. 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 CT head and neck showed no acute findings, chest x-ray showed no acute processes. FINDINGS: BOWEL: A few dilated small bowel loops in the right lower quadrant. SOFT TISSUES: No opaque urinary calculi. BONES: No acute osseous abnormality. IMPRESSION: 1. Few dilated small bowel loops in the right lower quadrant, focal ileus versus partial small bowel obstruction. 2. Bibasilar opacities. Electronically signed  by: Donnice Mania MD  04/09/2024 11:37 AM EDT RP Workstation: [id number]   DG Chest Port 1 View Result Date: 04/08/2024 EXAM: 1 VIEW XRAY OF THE CHEST 04/08/2024 07:27:00 PM COMPARISON: 01/13/2024 CLINICAL HISTORY: Altered mental status. Pt had a unwitnessed fall early this morning. Pt has altered mental status. Best obtainable chest xray due to patient not being able to follow commands. FINDINGS: LUNGS AND PLEURA: Low lung volumes. Bibasilar atelectasis. No focal pulmonary opacity. No pulmonary edema. No pleural effusion. No pneumothorax. HEART AND MEDIASTINUM: Atherosclerotic plaque. No acute abnormality of the cardiac and mediastinal silhouettes. BONES AND SOFT TISSUES: No acute osseous abnormality. IMPRESSION: 1. No acute process. 2. Low lung volumes and bibasilar atelectasis. Electronically signed by: Norman Gatlin MD 04/08/2024 07:37 PM EDT RP Workstation: [id number]   CT Cervical Spine Wo Contrast Result Date: 04/08/2024 EXAM: CT CERVICAL SPINE WITHOUT CONTRAST 04/08/2024 04:54:32 PM TECHNIQUE: CT of the cervical spine was performed without the administration of intravenous contrast. Multiplanar reformatted images are provided for review. Automated exposure control, iterative reconstruction, and/or weight based adjustment of the mA/kV was utilized to reduce the radiation dose to as low as reasonably achievable. COMPARISON: None available. CLINICAL HISTORY: Neck trauma (Age >= 65y). 83 y/o male that fell around 0400 this morning. Wife reports slurred speech, delayed response, slower movements. Pt unaware of fall. VAN negative, no unilateral weakness noted. FINDINGS: CERVICAL SPINE: BONES AND ALIGNMENT: Cervical lordosis is maintained. 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 Trace anterolisthesis of C7 on T1. No facet dislocation. DEGENERATIVE CHANGES: Facet arthrosis and uncovertebral hypertrophy at multiple levels. No high-grade osseous spinal canal stenosis. SOFT TISSUES: No prevertebral soft tissue swelling. IMPRESSION: 1. No  acute abnormality of the cervical spine related to the reported neck trauma. Electronically signed by: Donnice Mania MD 04/08/2024 05:12 PM EDT RP Workstation: [id number]   CT HEAD WO CONTRAST Result Date: 04/08/2024 EXAM: CT HEAD WITHOUT CONTRAST 04/08/2024 04:54:32 PM TECHNIQUE: CT of the head was performed without the administration of intravenous contrast. Automated exposure control, iterative reconstruction, and/or weight based adjustment of the mA/kV was utilized to reduce the radiation dose to as low as reasonably achievable. COMPARISON: MRI head 06/23/2018 CLINICAL HISTORY: Mental status change, unknown cause. 83 y/o male that fell around 0400 this morning. Wife reports slurred speech, delayed response, slower movements. Pt unaware of fall. VAN negative, no unilateral weakness noted. FINDINGS: BRAIN AND VENTRICLES: No acute hemorrhage. No evidence of acute infarct. No hydrocephalus. No extra-axial collection. No mass effect or midline shift. Nonspecific hypoattenuation in the periventricular and subcortical white matter, most likely representing chronic microvascular ischemic changes. Mild generalized parenchymal volume loss. ORBITS: No acute abnormality. SINUSES: No acute abnormality. SOFT TISSUES AND SKULL: No acute soft tissue abnormality. No skull fracture. Bright lens replacement. IMPRESSION: 1. No acute intracranial abnormality. 2. Mild for age chronic microvascular ischemic changes. 3. Mild generalized parenchymal volume loss. Electronically signed by: Donnice Mania MD 04/08/2024 05:04 PM EDT RP Workstation: [id number]    Micro Results    Recent Results (from the past 240 hours)  Urine Culture     Status: Abnormal   Collection Time: 04/21/24  1:10 PM   Specimen: Urine, Catheterized  Result Value Ref Range Status   Specimen Description URINE, CATHETERIZED  Final   Special Requests   Final    NONE Performed at [facility], [address] [place], [place] [address]    Culture  50,000 COLONIES/mL ESCHERICHIA COLI (A)  Final   Report Status 04/23/2024 FINAL  Final  Organism ID, Bacteria ESCHERICHIA COLI (A)  Final      Susceptibility   Escherichia coli - MIC*    AMPICILLIN  >=32 RESISTANT Resistant     CEFAZOLIN (URINE) Value in next row Resistant      >=32 RESISTANTThis is a modified FDA-approved test that has been validated and its performance characteristics determined by the reporting laboratory.  This laboratory is certified under the Clinical Laboratory Improvement Amendments CLIA as qualified to perform high complexity clinical laboratory testing.    CEFEPIME Value in next row Sensitive      >=32 RESISTANTThis is a modified FDA-approved test that has been validated and its performance characteristics determined by the reporting laboratory.  This laboratory is certified under the Clinical Laboratory Improvement Amendments CLIA as qualified to perform high complexity clinical laboratory testing.    ERTAPENEM Value in next row Sensitive      >=32 RESISTANTThis is a modified FDA-approved test that has been validated and its performance characteristics determined by the reporting laboratory.  This laboratory is certified under the Clinical Laboratory Improvement Amendments CLIA as qualified to perform high complexity clinical laboratory testing.    CEFTRIAXONE  Value in next row Sensitive      >=32 RESISTANTThis is a modified FDA-approved test that has been validated and its performance characteristics determined by the reporting laboratory.  This laboratory is certified under the Clinical Laboratory Improvement Amendments CLIA as qualified to perform high complexity clinical laboratory testing.    CIPROFLOXACIN Value in next row Sensitive      >=32 RESISTANTThis is a modified FDA-approved test that has been validated and its performance characteristics determined by the reporting laboratory.  This laboratory is certified under the Clinical Laboratory Improvement Amendments  CLIA as qualified to perform high complexity clinical laboratory testing.    GENTAMICIN Value in next row Sensitive      >=32 RESISTANTThis is a modified FDA-approved test that has been validated and its performance characteristics determined by the reporting laboratory.  This laboratory is certified under the Clinical Laboratory Improvement Amendments CLIA as qualified to perform high complexity clinical laboratory testing.    NITROFURANTOIN Value in next row Sensitive      >=32 RESISTANTThis is a modified FDA-approved test that has been validated and its performance characteristics determined by the reporting laboratory.  This laboratory is certified under the Clinical Laboratory Improvement Amendments CLIA as qualified to perform high complexity clinical laboratory testing.    TRIMETH/SULFA Value in next row Sensitive      >=32 RESISTANTThis is a modified FDA-approved test that has been validated and its performance characteristics determined by the reporting laboratory.  This laboratory is certified under the Clinical Laboratory Improvement Amendments CLIA as qualified to perform high complexity clinical laboratory testing.    AMPICILLIN /SULBACTAM Value in next row Resistant      >=32 RESISTANTThis is a modified FDA-approved test that has been validated and its performance characteristics determined by the reporting laboratory.  This laboratory is certified under the Clinical Laboratory Improvement Amendments CLIA as qualified to perform high complexity clinical laboratory testing.    PIP/TAZO Value in next row Sensitive      16 SENSITIVEThis is a modified FDA-approved test that has been validated and its performance characteristics determined by the reporting laboratory.  This laboratory is certified under the Clinical Laboratory Improvement Amendments CLIA as qualified to perform high complexity clinical laboratory testing.    MEROPENEM Value in next row Sensitive      16 SENSITIVEThis is a  modified  FDA-approved test that has been validated and its performance characteristics determined by the reporting laboratory.  This laboratory is certified under the Clinical Laboratory Improvement Amendments CLIA as qualified to perform high complexity clinical laboratory testing.    * 50,000 COLONIES/mL ESCHERICHIA COLI  Blood culture (routine x 2)     Status: None   Collection Time: 04/21/24  1:49 PM   Specimen: BLOOD LEFT HAND  Result Value Ref Range Status   Specimen Description BLOOD LEFT HAND  Final   Special Requests   Final    BOTTLES DRAWN AEROBIC AND ANAEROBIC Blood Culture results may not be optimal due to an inadequate volume of blood received in culture bottles   Culture   Final    NO GROWTH 5 DAYS Performed at [facility], [address], [place], [place] [address]    Report Status 04/26/2024 FINAL  Final  Blood culture (routine x 2)     Status: None   Collection Time: 04/22/24  1:34 PM   Specimen: BLOOD RIGHT ARM  Result Value Ref Range Status   Specimen Description BLOOD RIGHT ARM  Final   Special Requests   Final    BOTTLES DRAWN AEROBIC AND ANAEROBIC Blood Culture adequate volume   Culture   Final    NO GROWTH 5 DAYS Performed at [facility], [address] [place], [place] [address]    Report Status 04/27/2024 FINAL  Final    Today   Subjective    Richard Frederick today has no headache,no chest abdominal pain,no new weakness tingling or numbness, feels much better wants to go home today.    Objective   Blood pressure 132/64, pulse (!) 107, temperature 98.7 F (37.1 C), temperature source Oral, resp. rate 17, weight 71.4 kg, SpO2 95%.   Intake/Output Summary (Last 24 hours) at 04/27/2024 1150 Last data filed at 04/27/2024 0540 Gross per 24 hour  Intake 240 ml  Output 1600 ml  Net -1360 ml    Exam  Awake Alert, No new F.N deficits,    George.AT,PERRAL Supple Neck,   Symmetrical Chest wall movement, Good air movement bilaterally,  CTAB RRR,No Gallops,   +ve B.Sounds, Abd Soft, Non tender,  No Cyanosis, Clubbing or edema    Data Review   Recent Labs  Lab 04/21/24 1142 04/22/24 0439 04/23/24 0214 04/24/24 0238 04/25/24 0206 04/26/24 0321  WBC 25.4* 21.4* 16.6* 14.5* 15.1* 13.6*  HGB 15.7 13.7 13.1 14.1 15.5 15.0  HCT 48.1 42.1 40.3 42.9 48.7 46.3  PLT 346 323 298 341 382 374  MCV 86.8 87.7 87.4 87.4 89.0 88.0  MCH 28.3 28.5 28.4 28.7 28.3 28.5  MCHC 32.6 32.5 32.5 32.9 31.8 32.4  RDW 13.2 13.1 13.0 12.8 12.9 13.1  LYMPHSABS 1.1  --   --  1.9 2.4 2.0  MONOABS 1.5*  --   --  1.2* 1.1* 0.9  EOSABS 0.3  --   --  0.7* 0.7* 0.6*  BASOSABS 0.1  --   --  0.1 0.1 0.1    Recent Labs  Lab 04/21/24 1142 04/21/24 1641 04/21/24 1642 04/21/24 1705 04/21/24 1928 04/22/24 0439 04/23/24 0214 04/23/24 0757 04/24/24 0238 04/25/24 0206 04/26/24 0321  NA 143  --   --   --   --  144 143  --  143 145 141  K 3.6  --   --   --   --  3.8 3.3*  --  3.5 3.9 3.7  CL 101  --   --   --   --  105 106  --  105 107 108  CO2 26  --   --   --   --  27 26  --  25 25 24   ANIONGAP 16*  --   --   --   --  12 11  --  13 13 9   GLUCOSE 173*  --   --   --   --  148* 133*  --  109* 123* 121*  BUN 62*  --   --   --   --  43* 32*  --  23 22 25*  CREATININE 2.03*  --   --   --   --  1.55* 1.35*  --  1.25* 1.30* 1.35*  AST 42*  --   --   --   --  35 29  --   --   --   --   ALT 61*  --   --   --   --  50* 49*  --   --   --   --   ALKPHOS 77  --   --   --   --  66 63  --   --   --   --   BILITOT 1.1  --   --   --   --  1.0 1.0  --   --   --   --   ALBUMIN 3.3*  --   --   --   --  2.5* 2.3*  --   --   --   --   CRP  --   --   --   --   --   --   --  14.1* 11.1* 8.0* 4.0*  PROCALCITON  --   --   --   --   --   --  <0.10  --  <0.10 <0.10 <0.10  LATICACIDVEN  --   --   --  1.6 1.5  --   --   --   --   --   --   TSH  --  4.203  --   --   --   --   --   --   --   --   --   MG  --   --  1.7  --   --   --  1.6*  --  1.9 1.8 1.7  CALCIUM 10.1   --   --   --   --  9.1 8.8*  --  8.7* 9.2 8.9    Total Time in preparing paper work, data evaluation and todays exam - 35 minutes  Signature  -    Lavada Stank M.D on 04/27/2024 at 11:50 AM   -  To page go to [email or web address]

## 2024-04-28 DIAGNOSIS — N400 Enlarged prostate without lower urinary tract symptoms: Secondary | ICD-10-CM | POA: Diagnosis not present

## 2024-04-28 DIAGNOSIS — N390 Urinary tract infection, site not specified: Secondary | ICD-10-CM | POA: Diagnosis not present

## 2024-04-28 DIAGNOSIS — F411 Generalized anxiety disorder: Secondary | ICD-10-CM | POA: Diagnosis not present

## 2024-04-28 DIAGNOSIS — F0390 Unspecified dementia without behavioral disturbance: Secondary | ICD-10-CM | POA: Diagnosis not present

## 2024-04-28 DIAGNOSIS — E1169 Type 2 diabetes mellitus with other specified complication: Secondary | ICD-10-CM | POA: Diagnosis not present

## 2024-04-28 DIAGNOSIS — I4891 Unspecified atrial fibrillation: Secondary | ICD-10-CM | POA: Diagnosis not present

## 2024-04-29 DIAGNOSIS — R296 Repeated falls: Secondary | ICD-10-CM | POA: Diagnosis not present

## 2024-04-29 DIAGNOSIS — F0390 Unspecified dementia without behavioral disturbance: Secondary | ICD-10-CM | POA: Diagnosis not present

## 2024-05-01 DIAGNOSIS — R2681 Unsteadiness on feet: Secondary | ICD-10-CM | POA: Diagnosis not present

## 2024-05-01 DIAGNOSIS — Z741 Need for assistance with personal care: Secondary | ICD-10-CM | POA: Diagnosis not present

## 2024-05-01 DIAGNOSIS — M6259 Muscle wasting and atrophy, not elsewhere classified, multiple sites: Secondary | ICD-10-CM | POA: Diagnosis not present

## 2024-05-01 DIAGNOSIS — R41841 Cognitive communication deficit: Secondary | ICD-10-CM | POA: Diagnosis not present

## 2024-05-01 DIAGNOSIS — M6281 Muscle weakness (generalized): Secondary | ICD-10-CM | POA: Diagnosis not present

## 2024-05-01 DIAGNOSIS — R1311 Dysphagia, oral phase: Secondary | ICD-10-CM | POA: Diagnosis not present

## 2024-05-04 DIAGNOSIS — E1122 Type 2 diabetes mellitus with diabetic chronic kidney disease: Secondary | ICD-10-CM | POA: Diagnosis not present

## 2024-05-04 DIAGNOSIS — G9340 Encephalopathy, unspecified: Secondary | ICD-10-CM | POA: Diagnosis not present

## 2024-05-04 DIAGNOSIS — R41841 Cognitive communication deficit: Secondary | ICD-10-CM | POA: Diagnosis not present

## 2024-05-04 DIAGNOSIS — N400 Enlarged prostate without lower urinary tract symptoms: Secondary | ICD-10-CM | POA: Diagnosis not present

## 2024-05-04 DIAGNOSIS — F0390 Unspecified dementia without behavioral disturbance: Secondary | ICD-10-CM | POA: Diagnosis not present

## 2024-05-04 DIAGNOSIS — I509 Heart failure, unspecified: Secondary | ICD-10-CM | POA: Diagnosis not present

## 2024-05-04 DIAGNOSIS — Z741 Need for assistance with personal care: Secondary | ICD-10-CM | POA: Diagnosis not present

## 2024-05-04 DIAGNOSIS — M6281 Muscle weakness (generalized): Secondary | ICD-10-CM | POA: Diagnosis not present

## 2024-05-04 DIAGNOSIS — R2681 Unsteadiness on feet: Secondary | ICD-10-CM | POA: Diagnosis not present

## 2024-05-04 DIAGNOSIS — R1311 Dysphagia, oral phase: Secondary | ICD-10-CM | POA: Diagnosis not present

## 2024-05-04 DIAGNOSIS — I4891 Unspecified atrial fibrillation: Secondary | ICD-10-CM | POA: Diagnosis not present

## 2024-05-04 DIAGNOSIS — M6259 Muscle wasting and atrophy, not elsewhere classified, multiple sites: Secondary | ICD-10-CM | POA: Diagnosis not present

## 2024-05-04 DIAGNOSIS — I10 Essential (primary) hypertension: Secondary | ICD-10-CM | POA: Diagnosis not present

## 2024-05-05 DIAGNOSIS — R338 Other retention of urine: Secondary | ICD-10-CM | POA: Diagnosis not present

## 2024-05-11 DIAGNOSIS — R41841 Cognitive communication deficit: Secondary | ICD-10-CM | POA: Diagnosis not present

## 2024-05-11 DIAGNOSIS — M6259 Muscle wasting and atrophy, not elsewhere classified, multiple sites: Secondary | ICD-10-CM | POA: Diagnosis not present

## 2024-05-11 DIAGNOSIS — R2681 Unsteadiness on feet: Secondary | ICD-10-CM | POA: Diagnosis not present

## 2024-05-11 DIAGNOSIS — Z741 Need for assistance with personal care: Secondary | ICD-10-CM | POA: Diagnosis not present

## 2024-05-11 DIAGNOSIS — R1311 Dysphagia, oral phase: Secondary | ICD-10-CM | POA: Diagnosis not present

## 2024-05-11 DIAGNOSIS — M6281 Muscle weakness (generalized): Secondary | ICD-10-CM | POA: Diagnosis not present

## 2024-05-11 DIAGNOSIS — F0390 Unspecified dementia without behavioral disturbance: Secondary | ICD-10-CM | POA: Diagnosis not present

## 2024-05-11 DIAGNOSIS — R051 Acute cough: Secondary | ICD-10-CM | POA: Diagnosis not present

## 2024-05-11 DIAGNOSIS — R0989 Other specified symptoms and signs involving the circulatory and respiratory systems: Secondary | ICD-10-CM | POA: Diagnosis not present

## 2024-05-11 DIAGNOSIS — R059 Cough, unspecified: Secondary | ICD-10-CM | POA: Diagnosis not present

## 2024-05-11 NOTE — Progress Notes (Unsigned)
 Cardiology Clinic Note   Patient Name: Richard Frederick Date of Encounter: 05/13/2024  Primary Care Provider:  Dwight Trula SQUIBB, MD Primary Cardiologist:  None  Patient Profile    Richard Frederick 83 year old male presents to the clinic today for posthospital follow-up and evaluation of his atrial fibrillation with RVR.  Past Medical History    Past Medical History:  Diagnosis Date   A-fib (HCC)    Anxiety    Dementia (HCC)    Hyperlipemia    Hypertension    Type II diabetes mellitus (HCC)    Vision disturbance    Past Surgical History:  Procedure Laterality Date   CATARACT EXTRACTION W/ INTRAOCULAR LENS IMPLANT Right ~ 2013   COLONOSCOPY W/ BIOPSIES AND POLYPECTOMY     COLONOSCOPY WITH PROPOFOL  N/A 04/10/2016   Procedure: COLONOSCOPY WITH PROPOFOL ;  Surgeon: Richard MARLA Louder, MD;  Location: [facility];  Service: Endoscopy;  Laterality: N/A;   TRANSURETHRAL RESECTION OF PROSTATE  early 2000s    Allergies  Allergies  Allergen Reactions   Amlodipine Other (See Comments)    Edema    Ativan  [Lorazepam ] Other (See Comments)    Agitation, paradoxic reaction   Atorvastatin Other (See Comments)    Achy legs   Pravastatin Other (See Comments)    Achy legs   Rosuvastatin Other (See Comments)    Achy legs    History of Present Illness    Richard Frederick has a PMH of paroxysmal atrial fibrillation, HTN, dementia, and type 2 diabetes.  He was admitted on 04/22/2024 and discharged on 04/27/2024.  He had also been admitted during the first part of September with confusion and a fall.  During that time he was noted to have slurred speech and delayed response.  [name]  He had been discharged on September 13.  He was in his nursing home.  The morning of admission he was found lying on the ground on the left side at the nursing facility.  He was noted to have ongoing weakness, delirium, and falls.  MRI brain, CT  showed longstanding dementia and acute encephalopathy.  He was noted to have a UTI.  He received supportive care and urinary catheter placement.  His mentation returned back to baseline.  He was also noted to have atrial fibrillation with RVR.  He was felt to be a poor candidate for anticoagulation due to his history of falls and ambulatory dysfunction.  He was placed on Cardizem  gtt.  He was transition to a combination of Cardizem  and carvedilol.  He received antibiotics for UTI.  His blood pressure was stable on diltiazem  and carvedilol.  [name]  He is lethargic today.  [name]  He is easily arousable and answers questions appropriately.  He is oriented to self.  [name]  His heart rate today is rate controlled at 82.  His blood pressure is 112/57.  He denies irregular or accelerated heartbeats.  He presents in a wheelchair.  I will continue his current medication regimen and plan follow-up in 3 to 4 months.  Today he denies chest pain, shortness of breath, lower extremity edema, fatigue, palpitations, melena, hematuria, hemoptysis, diaphoresis, weakness, presyncope, syncope, orthopnea, and PND.     Home Medications    Prior to Admission medications  Medication Sig Start Date End Date Taking? Authorizing Provider  acetaminophen  (TYLENOL ) 325 MG tablet Take 650 mg by mouth every 4 (four) hours as needed for mild pain (pain score 1-3) or moderate pain (pain score 4-6).    [provider]  bisacodyl (DULCOLAX) 10 MG suppository Place 10 mg rectally daily as needed for moderate constipation.    [provider]  carvedilol (COREG) 6.25 MG tablet Take 1 tablet (6.25 mg total) by mouth 2 (two) times daily with a meal. 04/27/24   Richard Lavada POUR, MD  clopidogrel  (PLAVIX ) 75 MG tablet Take 1 tablet (75 mg total) by mouth daily. Please  request future refills from PCP. 06/29/19   Richard Duos, MD  diltiazem  (CARDIZEM  CD) 300 MG 24 hr capsule Take 1 capsule (300 mg total) by mouth daily. 04/27/24   Richard Lavada POUR, MD  donepezil  (ARICEPT ) 10 MG tablet Take 10 mg by mouth daily. 04/06/18   [provider]  ezetimibe  (ZETIA ) 10 MG tablet Take 10 mg by mouth at bedtime. 06/18/18   [provider]  magnesium  hydroxide (MILK OF MAGNESIA) 400 MG/5ML suspension Take 30 mLs by mouth daily as needed for mild constipation or moderate constipation (no bm in 3 days).    [provider]  melatonin 3 MG TABS tablet Take 2 tablets (6 mg total) by mouth at bedtime. 04/11/24   Richard Celinda Balo, MD  memantine  (NAMENDA ) 10 MG tablet Take 10 mg by mouth 2 (two) times daily. 06/25/18   [provider]  metFORMIN  (GLUCOPHAGE ) 500 MG tablet Take 500 mg by mouth daily with breakfast.    [provider]  Multiple Vitamins-Minerals (PRESERVISION AREDS 2) CAPS Take 2 capsules by mouth daily. 11/26/18   [provider]  niacin  50 MG tablet Take 50 mg by mouth in the morning.    [provider]  QUEtiapine  (SEROQUEL ) 25 MG tablet Take 1 tablet (25 mg total) by mouth at bedtime. 04/11/24   Richard Celinda Balo, MD  sertraline  (ZOLOFT ) 50 MG tablet Take 50 mg by mouth daily. 06/25/18   [provider]  tamsulosin  (FLOMAX ) 0.4 MG CAPS capsule Take 0.4 mg by mouth daily. 04/29/18   [provider]    Family History    Family History  Problem Relation Age of Onset   Breast cancer Sister    Heart disease Sister    Stroke Mother    Cancer Father        unsure of type   He indicated that his mother is deceased. He indicated that his father is deceased.  Social History    Social History   Socioeconomic History   Marital status: Married    Spouse name: Not on file   Number of children: 0   Years of education: some college   Highest education level: Not on file  Occupational  History   Occupation: Retired  Tobacco Use   Smoking status: Former    Current packs/day: 1.00    Average packs/day: 1 pack/day for 12.0 years (12.0 ttl pk-yrs)    Types: Cigarettes   Smokeless tobacco: Former    Types: Chew   Tobacco comments:    quit smoking in the 1970s; quit chewing ~ 2007  Substance and Sexual Activity   Alcohol use: No   Drug use: No   Sexual activity: Never  Other Topics Concern   Not on file  Social History Narrative   Lives at home with his wife.   Right-handed.  3 cup of caffeine per day.   Social Drivers of Corporate investment banker Strain: Not on file  Food Insecurity: Patient Unable To Answer (04/22/2024)   Hunger Vital Sign    Worried About Running Out of Food in the Last Year: Patient unable to answer    Ran Out of Food in the Last Year: Patient unable to answer  Transportation Needs: No Transportation Needs (04/22/2024)   PRAPARE - Administrator, Civil Service (Medical): No    Lack of Transportation (Non-Medical): No  Physical Activity: Not on file  Stress: Not on file  Social Connections: Socially Integrated (04/22/2024)   Social Connection and Isolation Panel    Frequency of Communication with Friends and Family: More than three times a week    Frequency of Social Gatherings with Friends and Family: More than three times a week    Attends Religious Services: More than 4 times per year    Active Member of [employer] or Organizations: Yes    Attends Engineer, structural: More than 4 times per year    Marital Status: Married  Catering manager Violence: Patient Unable To Answer (04/22/2024)   Humiliation, Afraid, Rape, and Kick questionnaire    Fear of Current or Ex-Partner: Patient unable to answer    Emotionally Abused: Patient unable to answer    Physically Abused: Patient unable to answer    Sexually Abused: Patient unable to answer     Review of Systems    General:  No chills, fever, night sweats or weight  changes.  Cardiovascular:  No chest pain, dyspnea on exertion, edema, orthopnea, palpitations, paroxysmal nocturnal dyspnea. Dermatological: No rash, lesions/masses Respiratory: No cough, dyspnea Urologic: No hematuria, dysuria Abdominal:   No nausea, vomiting, diarrhea, bright red blood per rectum, melena, or hematemesis Neurologic:  No visual changes, wkns, changes in mental status. All other systems reviewed and are otherwise negative except as noted above.  Physical Exam    VS:  BP (!) 112/57   Pulse 82   Ht 5' 7 (1.702 m)   Wt 162 lb (73.5 kg)   SpO2 94%   BMI 25.37 kg/m  , BMI Body mass index is 25.37 kg/m. GEN: Well nourished, well developed, in no acute distress. HEENT: normal. Neck: Supple, no JVD, carotid bruits, or masses. Cardiac: RRR, no murmurs, rubs, or gallops. No clubbing, cyanosis, edema.  Radials/DP/PT 2+ and equal bilaterally.  Respiratory:  Respirations regular and unlabored, clear to auscultation bilaterally. GI: Soft, nontender, nondistended, BS + x 4. MS: no deformity or atrophy. Skin: warm and dry, no rash. Neuro:  Strength and sensation are intact. Psych: Lethargic, oriented to self  Accessory Clinical Findings    Recent Labs: 01/13/2024: B Natriuretic Peptide 32.2 04/21/2024: TSH 4.203 04/23/2024: ALT 49 04/26/2024: BUN 25; Creatinine, Ser 1.35; Hemoglobin 15.0; Magnesium  1.7; Platelets 374; Potassium 3.7; Sodium 141   Recent Lipid Panel    Component Value Date/Time   CHOL 193 04/09/2024 0000   TRIG 76 04/09/2024 0000   HDL 47 04/09/2024 0000   CHOLHDL 4.1 04/09/2024 0000   VLDL 15 04/09/2024 0000   LDLCALC 131 (H) 04/09/2024 0000         ECG personally reviewed by me today-none today.    Echocardiogram 04/09/2024  IMPRESSIONS     1. Left ventricular ejection fraction, by estimation, is 55 to 60%. The  left ventricle has normal function. Left ventricular diastolic function  could not be evaluated.   2. Right ventricular  systolic  function was not well visualized. The right  ventricular size is not well visualized.   3. The mitral valve was not well visualized.   4. The aortic valve was not well visualized.   FINDINGS   Left Ventricle: Left ventricular ejection fraction, by estimation, is 55  to 60%. The left ventricle has normal function. Suboptimal image quality  limits for assessment of left ventricular hypertrophy. Left ventricular  diastolic function could not be  evaluated.   Right Ventricle: The right ventricular size is not well visualized. Right  vetricular wall thickness was not assessed. Right ventricular systolic  function was not well visualized.   Left Atrium: Left atrial size was not assessed.   Right Atrium: Right atrial size was not assessed.   Pericardium: Presence of epicardial fat layer.   Mitral Valve: The mitral valve was not well visualized.   Tricuspid Valve: The tricuspid valve is not well visualized.   Aortic Valve: The aortic valve was not well visualized.   Pulmonic Valve: The pulmonic valve was not well visualized.   Aorta: The aortic root was not well visualized and the aortic arch was not  well visualized.   IAS/Shunts: The interatrial septum was not assessed.   Kardie Tobb DO  Electronically signed by Dub Huntsman DO  Signature Date/Time: 04/09/2024/5:14:29 PM          Assessment & Plan   1.  Atrial fibrillation-heart rate today 82 bpm.  Recently admitted and noted to be in atrial fibrillation with RVR.  Not a candidate for anticoagulation due to fall history and ambulatory dysfunction.  Will continue with rate control therapy. Continue diltiazem , carvedilol Avoid triggers caffeine, chocolate, EtOH, dehydration excetra. Maintain physical activity as tolerated  Essential hypertension-BP today 112/57. Continue diltiazem , carvedilol Heart healthy low-sodium diet  Advanced dementia-mood stable today.  Oriented to self. 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  Baseline creatinine around 1.25. Avoid nephrotoxic agents Maintain p.o. hydration Follows with PCP  Disposition: Follow-up with Dr. Barbaraann or me in 3-4 months   Josefa HERO. Gaetan Spieker NP-C     05/13/2024, 10:37 AM [facility] [address] [place], [place] [address] Office [phone]    Notice: This dictation was prepared with Dragon dictation along with smaller phrase technology. Any transcriptional errors that result from this process are unintentional and may not be corrected upon review.   I spent 14 minutes examining this patient, reviewing medications, and using patient centered shared decision making involving their cardiac care.   I spent  20 minutes reviewing past medical history,  medications, and prior cardiac tests.

## 2024-05-12 DIAGNOSIS — R41841 Cognitive communication deficit: Secondary | ICD-10-CM | POA: Diagnosis not present

## 2024-05-12 DIAGNOSIS — R1311 Dysphagia, oral phase: Secondary | ICD-10-CM | POA: Diagnosis not present

## 2024-05-12 DIAGNOSIS — R2681 Unsteadiness on feet: Secondary | ICD-10-CM | POA: Diagnosis not present

## 2024-05-12 DIAGNOSIS — M6281 Muscle weakness (generalized): Secondary | ICD-10-CM | POA: Diagnosis not present

## 2024-05-12 DIAGNOSIS — Z741 Need for assistance with personal care: Secondary | ICD-10-CM | POA: Diagnosis not present

## 2024-05-12 DIAGNOSIS — E1122 Type 2 diabetes mellitus with diabetic chronic kidney disease: Secondary | ICD-10-CM | POA: Diagnosis not present

## 2024-05-12 DIAGNOSIS — M6259 Muscle wasting and atrophy, not elsewhere classified, multiple sites: Secondary | ICD-10-CM | POA: Diagnosis not present

## 2024-05-13 ENCOUNTER — Encounter: Payer: Self-pay | Admitting: General Practice

## 2024-05-13 ENCOUNTER — Ambulatory Visit: Attending: Cardiovascular Disease | Admitting: General Practice

## 2024-05-13 VITALS — BP 112/57 | HR 82 | Ht 67.0 in | Wt 162.0 lb

## 2024-05-13 DIAGNOSIS — I4819 Other persistent atrial fibrillation: Secondary | ICD-10-CM | POA: Diagnosis not present

## 2024-05-13 DIAGNOSIS — I10 Essential (primary) hypertension: Secondary | ICD-10-CM

## 2024-05-13 DIAGNOSIS — N1832 Chronic kidney disease, stage 3b: Secondary | ICD-10-CM | POA: Diagnosis not present

## 2024-05-13 DIAGNOSIS — F03918 Unspecified dementia, unspecified severity, with other behavioral disturbance: Secondary | ICD-10-CM

## 2024-05-13 DIAGNOSIS — R5383 Other fatigue: Secondary | ICD-10-CM | POA: Diagnosis not present

## 2024-05-13 DIAGNOSIS — G9340 Encephalopathy, unspecified: Secondary | ICD-10-CM | POA: Diagnosis not present

## 2024-05-13 NOTE — Patient Instructions (Signed)
 Medication Instructions:  Continue current medication regimen  *If you need a refill on your cardiac medications before your next appointment, please call your pharmacy*  Lab Work: None today  If you have labs (blood work) drawn today and your tests are completely normal, you will receive your results only by: MyChart Message (if you have MyChart) OR A paper copy in the mail If you have any lab test that is abnormal or we need to change your treatment, we will call you to review the results.  Testing/Procedures: No lab testing or procedures today.  Follow-Up: At [facility], you and your health needs are our priority.  As part of our continuing mission to provide you with exceptional heart care, our providers are all part of one team.  This team includes your primary Cardiologist (physician) and Advanced Practice Providers or APPs (Physician Assistants and Nurse Practitioners) who all work together to provide you with the care you need, when you need it.  Your next appointment:   Please return to clinic in 3 to 4 months.  Provider:    You may see Dr. Barbaraann or Josefa Beauvais, NP-C in follow-up.  We recommend signing up for the patient portal called MyChart.  Sign up information is provided on this After Visit Summary.  MyChart is used to connect with patients for Virtual Visits (Telemedicine).  Patients are able to view lab/test results, encounter notes, upcoming appointments, etc.  Non-urgent messages can be sent to your provider as well.   To learn more about what you can do with MyChart, go to [email or web address].   Other Instructions Avoid triggers for palpitations caffeine, chocolate, EtOH, dehydration excetra.

## 2024-05-15 DIAGNOSIS — M6281 Muscle weakness (generalized): Secondary | ICD-10-CM | POA: Diagnosis not present

## 2024-05-15 DIAGNOSIS — R5383 Other fatigue: Secondary | ICD-10-CM | POA: Diagnosis not present

## 2024-05-15 DIAGNOSIS — R1311 Dysphagia, oral phase: Secondary | ICD-10-CM | POA: Diagnosis not present

## 2024-05-15 DIAGNOSIS — G9340 Encephalopathy, unspecified: Secondary | ICD-10-CM | POA: Diagnosis not present

## 2024-05-15 DIAGNOSIS — R41841 Cognitive communication deficit: Secondary | ICD-10-CM | POA: Diagnosis not present

## 2024-05-15 DIAGNOSIS — F0390 Unspecified dementia without behavioral disturbance: Secondary | ICD-10-CM | POA: Diagnosis not present

## 2024-05-15 DIAGNOSIS — Z741 Need for assistance with personal care: Secondary | ICD-10-CM | POA: Diagnosis not present

## 2024-05-15 DIAGNOSIS — E1122 Type 2 diabetes mellitus with diabetic chronic kidney disease: Secondary | ICD-10-CM | POA: Diagnosis not present

## 2024-05-15 DIAGNOSIS — R2681 Unsteadiness on feet: Secondary | ICD-10-CM | POA: Diagnosis not present

## 2024-05-15 DIAGNOSIS — M6259 Muscle wasting and atrophy, not elsewhere classified, multiple sites: Secondary | ICD-10-CM | POA: Diagnosis not present

## 2024-05-18 DIAGNOSIS — R41841 Cognitive communication deficit: Secondary | ICD-10-CM | POA: Diagnosis not present

## 2024-05-18 DIAGNOSIS — M6259 Muscle wasting and atrophy, not elsewhere classified, multiple sites: Secondary | ICD-10-CM | POA: Diagnosis not present

## 2024-05-18 DIAGNOSIS — M6281 Muscle weakness (generalized): Secondary | ICD-10-CM | POA: Diagnosis not present

## 2024-05-18 DIAGNOSIS — R1311 Dysphagia, oral phase: Secondary | ICD-10-CM | POA: Diagnosis not present

## 2024-05-18 DIAGNOSIS — R2681 Unsteadiness on feet: Secondary | ICD-10-CM | POA: Diagnosis not present

## 2024-05-18 DIAGNOSIS — Z741 Need for assistance with personal care: Secondary | ICD-10-CM | POA: Diagnosis not present

## 2024-05-18 DIAGNOSIS — E1122 Type 2 diabetes mellitus with diabetic chronic kidney disease: Secondary | ICD-10-CM | POA: Diagnosis not present

## 2024-05-19 DIAGNOSIS — R1311 Dysphagia, oral phase: Secondary | ICD-10-CM | POA: Diagnosis not present

## 2024-05-19 DIAGNOSIS — M6259 Muscle wasting and atrophy, not elsewhere classified, multiple sites: Secondary | ICD-10-CM | POA: Diagnosis not present

## 2024-05-19 DIAGNOSIS — E1122 Type 2 diabetes mellitus with diabetic chronic kidney disease: Secondary | ICD-10-CM | POA: Diagnosis not present

## 2024-05-19 DIAGNOSIS — M6281 Muscle weakness (generalized): Secondary | ICD-10-CM | POA: Diagnosis not present

## 2024-05-19 DIAGNOSIS — R41841 Cognitive communication deficit: Secondary | ICD-10-CM | POA: Diagnosis not present

## 2024-05-19 DIAGNOSIS — Z741 Need for assistance with personal care: Secondary | ICD-10-CM | POA: Diagnosis not present

## 2024-05-19 DIAGNOSIS — R2681 Unsteadiness on feet: Secondary | ICD-10-CM | POA: Diagnosis not present

## 2024-05-20 DIAGNOSIS — E1122 Type 2 diabetes mellitus with diabetic chronic kidney disease: Secondary | ICD-10-CM | POA: Diagnosis not present

## 2024-05-20 DIAGNOSIS — G9340 Encephalopathy, unspecified: Secondary | ICD-10-CM | POA: Diagnosis not present

## 2024-05-20 DIAGNOSIS — R1311 Dysphagia, oral phase: Secondary | ICD-10-CM | POA: Diagnosis not present

## 2024-05-20 DIAGNOSIS — R2681 Unsteadiness on feet: Secondary | ICD-10-CM | POA: Diagnosis not present

## 2024-05-20 DIAGNOSIS — M6259 Muscle wasting and atrophy, not elsewhere classified, multiple sites: Secondary | ICD-10-CM | POA: Diagnosis not present

## 2024-05-20 DIAGNOSIS — M6281 Muscle weakness (generalized): Secondary | ICD-10-CM | POA: Diagnosis not present

## 2024-05-20 DIAGNOSIS — F0390 Unspecified dementia without behavioral disturbance: Secondary | ICD-10-CM | POA: Diagnosis not present

## 2024-05-20 DIAGNOSIS — Z741 Need for assistance with personal care: Secondary | ICD-10-CM | POA: Diagnosis not present

## 2024-05-20 DIAGNOSIS — R5383 Other fatigue: Secondary | ICD-10-CM | POA: Diagnosis not present

## 2024-05-20 DIAGNOSIS — R41841 Cognitive communication deficit: Secondary | ICD-10-CM | POA: Diagnosis not present

## 2024-05-20 DIAGNOSIS — N400 Enlarged prostate without lower urinary tract symptoms: Secondary | ICD-10-CM | POA: Diagnosis not present

## 2024-05-21 DIAGNOSIS — Z741 Need for assistance with personal care: Secondary | ICD-10-CM | POA: Diagnosis not present

## 2024-05-21 DIAGNOSIS — R1311 Dysphagia, oral phase: Secondary | ICD-10-CM | POA: Diagnosis not present

## 2024-05-21 DIAGNOSIS — R41841 Cognitive communication deficit: Secondary | ICD-10-CM | POA: Diagnosis not present

## 2024-05-21 DIAGNOSIS — M6281 Muscle weakness (generalized): Secondary | ICD-10-CM | POA: Diagnosis not present

## 2024-05-21 DIAGNOSIS — R2681 Unsteadiness on feet: Secondary | ICD-10-CM | POA: Diagnosis not present

## 2024-05-21 DIAGNOSIS — E1122 Type 2 diabetes mellitus with diabetic chronic kidney disease: Secondary | ICD-10-CM | POA: Diagnosis not present

## 2024-05-21 DIAGNOSIS — M6259 Muscle wasting and atrophy, not elsewhere classified, multiple sites: Secondary | ICD-10-CM | POA: Diagnosis not present

## 2024-05-25 DIAGNOSIS — Z741 Need for assistance with personal care: Secondary | ICD-10-CM | POA: Diagnosis not present

## 2024-05-25 DIAGNOSIS — M6259 Muscle wasting and atrophy, not elsewhere classified, multiple sites: Secondary | ICD-10-CM | POA: Diagnosis not present

## 2024-05-25 DIAGNOSIS — R2681 Unsteadiness on feet: Secondary | ICD-10-CM | POA: Diagnosis not present

## 2024-05-25 DIAGNOSIS — R1311 Dysphagia, oral phase: Secondary | ICD-10-CM | POA: Diagnosis not present

## 2024-05-25 DIAGNOSIS — E1122 Type 2 diabetes mellitus with diabetic chronic kidney disease: Secondary | ICD-10-CM | POA: Diagnosis not present

## 2024-05-25 DIAGNOSIS — M6281 Muscle weakness (generalized): Secondary | ICD-10-CM | POA: Diagnosis not present

## 2024-05-25 DIAGNOSIS — R41841 Cognitive communication deficit: Secondary | ICD-10-CM | POA: Diagnosis not present

## 2024-05-28 DIAGNOSIS — R296 Repeated falls: Secondary | ICD-10-CM | POA: Diagnosis not present

## 2024-06-02 DIAGNOSIS — Z9181 History of falling: Secondary | ICD-10-CM | POA: Diagnosis not present

## 2024-06-02 DIAGNOSIS — E1122 Type 2 diabetes mellitus with diabetic chronic kidney disease: Secondary | ICD-10-CM | POA: Diagnosis not present

## 2024-06-02 DIAGNOSIS — F03C11 Unspecified dementia, severe, with agitation: Secondary | ICD-10-CM | POA: Diagnosis not present

## 2024-06-02 DIAGNOSIS — R2689 Other abnormalities of gait and mobility: Secondary | ICD-10-CM | POA: Diagnosis not present

## 2024-06-02 DIAGNOSIS — M6281 Muscle weakness (generalized): Secondary | ICD-10-CM | POA: Diagnosis not present

## 2024-06-02 DIAGNOSIS — E119 Type 2 diabetes mellitus without complications: Secondary | ICD-10-CM | POA: Diagnosis not present

## 2024-06-03 DIAGNOSIS — Z9181 History of falling: Secondary | ICD-10-CM | POA: Diagnosis not present

## 2024-06-03 DIAGNOSIS — E1122 Type 2 diabetes mellitus with diabetic chronic kidney disease: Secondary | ICD-10-CM | POA: Diagnosis not present

## 2024-06-03 DIAGNOSIS — E119 Type 2 diabetes mellitus without complications: Secondary | ICD-10-CM | POA: Diagnosis not present

## 2024-06-03 DIAGNOSIS — F03C11 Unspecified dementia, severe, with agitation: Secondary | ICD-10-CM | POA: Diagnosis not present

## 2024-06-03 DIAGNOSIS — M6281 Muscle weakness (generalized): Secondary | ICD-10-CM | POA: Diagnosis not present

## 2024-06-03 DIAGNOSIS — R2689 Other abnormalities of gait and mobility: Secondary | ICD-10-CM | POA: Diagnosis not present

## 2024-06-04 DIAGNOSIS — Z9181 History of falling: Secondary | ICD-10-CM | POA: Diagnosis not present

## 2024-06-04 DIAGNOSIS — R8290 Unspecified abnormal findings in urine: Secondary | ICD-10-CM | POA: Diagnosis not present

## 2024-06-04 DIAGNOSIS — E1122 Type 2 diabetes mellitus with diabetic chronic kidney disease: Secondary | ICD-10-CM | POA: Diagnosis not present

## 2024-06-04 DIAGNOSIS — N3001 Acute cystitis with hematuria: Secondary | ICD-10-CM | POA: Diagnosis not present

## 2024-06-04 DIAGNOSIS — F03C11 Unspecified dementia, severe, with agitation: Secondary | ICD-10-CM | POA: Diagnosis not present

## 2024-06-04 DIAGNOSIS — R2689 Other abnormalities of gait and mobility: Secondary | ICD-10-CM | POA: Diagnosis not present

## 2024-06-04 DIAGNOSIS — R338 Other retention of urine: Secondary | ICD-10-CM | POA: Diagnosis not present

## 2024-06-04 DIAGNOSIS — N400 Enlarged prostate without lower urinary tract symptoms: Secondary | ICD-10-CM | POA: Diagnosis not present

## 2024-06-04 DIAGNOSIS — E119 Type 2 diabetes mellitus without complications: Secondary | ICD-10-CM | POA: Diagnosis not present

## 2024-06-04 DIAGNOSIS — M6281 Muscle weakness (generalized): Secondary | ICD-10-CM | POA: Diagnosis not present

## 2024-06-05 DIAGNOSIS — E1122 Type 2 diabetes mellitus with diabetic chronic kidney disease: Secondary | ICD-10-CM | POA: Diagnosis not present

## 2024-06-05 DIAGNOSIS — Z9181 History of falling: Secondary | ICD-10-CM | POA: Diagnosis not present

## 2024-06-05 DIAGNOSIS — E119 Type 2 diabetes mellitus without complications: Secondary | ICD-10-CM | POA: Diagnosis not present

## 2024-06-05 DIAGNOSIS — R2689 Other abnormalities of gait and mobility: Secondary | ICD-10-CM | POA: Diagnosis not present

## 2024-06-05 DIAGNOSIS — M6281 Muscle weakness (generalized): Secondary | ICD-10-CM | POA: Diagnosis not present

## 2024-06-05 DIAGNOSIS — F03C11 Unspecified dementia, severe, with agitation: Secondary | ICD-10-CM | POA: Diagnosis not present

## 2024-06-08 DIAGNOSIS — Z9181 History of falling: Secondary | ICD-10-CM | POA: Diagnosis not present

## 2024-06-08 DIAGNOSIS — E1122 Type 2 diabetes mellitus with diabetic chronic kidney disease: Secondary | ICD-10-CM | POA: Diagnosis not present

## 2024-06-08 DIAGNOSIS — E119 Type 2 diabetes mellitus without complications: Secondary | ICD-10-CM | POA: Diagnosis not present

## 2024-06-08 DIAGNOSIS — N3001 Acute cystitis with hematuria: Secondary | ICD-10-CM | POA: Diagnosis not present

## 2024-06-08 DIAGNOSIS — F03C11 Unspecified dementia, severe, with agitation: Secondary | ICD-10-CM | POA: Diagnosis not present

## 2024-06-08 DIAGNOSIS — R2689 Other abnormalities of gait and mobility: Secondary | ICD-10-CM | POA: Diagnosis not present

## 2024-06-08 DIAGNOSIS — M6281 Muscle weakness (generalized): Secondary | ICD-10-CM | POA: Diagnosis not present

## 2024-06-09 DIAGNOSIS — E1122 Type 2 diabetes mellitus with diabetic chronic kidney disease: Secondary | ICD-10-CM | POA: Diagnosis not present

## 2024-06-09 DIAGNOSIS — Z9181 History of falling: Secondary | ICD-10-CM | POA: Diagnosis not present

## 2024-06-09 DIAGNOSIS — R2689 Other abnormalities of gait and mobility: Secondary | ICD-10-CM | POA: Diagnosis not present

## 2024-06-09 DIAGNOSIS — M6281 Muscle weakness (generalized): Secondary | ICD-10-CM | POA: Diagnosis not present

## 2024-06-09 DIAGNOSIS — E119 Type 2 diabetes mellitus without complications: Secondary | ICD-10-CM | POA: Diagnosis not present

## 2024-06-09 DIAGNOSIS — F03C11 Unspecified dementia, severe, with agitation: Secondary | ICD-10-CM | POA: Diagnosis not present

## 2024-06-10 DIAGNOSIS — E1122 Type 2 diabetes mellitus with diabetic chronic kidney disease: Secondary | ICD-10-CM | POA: Diagnosis not present

## 2024-06-10 DIAGNOSIS — M6281 Muscle weakness (generalized): Secondary | ICD-10-CM | POA: Diagnosis not present

## 2024-06-10 DIAGNOSIS — Z9181 History of falling: Secondary | ICD-10-CM | POA: Diagnosis not present

## 2024-06-10 DIAGNOSIS — E119 Type 2 diabetes mellitus without complications: Secondary | ICD-10-CM | POA: Diagnosis not present

## 2024-06-10 DIAGNOSIS — F03C11 Unspecified dementia, severe, with agitation: Secondary | ICD-10-CM | POA: Diagnosis not present

## 2024-06-10 DIAGNOSIS — R2689 Other abnormalities of gait and mobility: Secondary | ICD-10-CM | POA: Diagnosis not present

## 2024-06-11 DIAGNOSIS — M6281 Muscle weakness (generalized): Secondary | ICD-10-CM | POA: Diagnosis not present

## 2024-06-11 DIAGNOSIS — Z9181 History of falling: Secondary | ICD-10-CM | POA: Diagnosis not present

## 2024-06-11 DIAGNOSIS — F03C11 Unspecified dementia, severe, with agitation: Secondary | ICD-10-CM | POA: Diagnosis not present

## 2024-06-11 DIAGNOSIS — E1122 Type 2 diabetes mellitus with diabetic chronic kidney disease: Secondary | ICD-10-CM | POA: Diagnosis not present

## 2024-06-11 DIAGNOSIS — R2689 Other abnormalities of gait and mobility: Secondary | ICD-10-CM | POA: Diagnosis not present

## 2024-06-11 DIAGNOSIS — E119 Type 2 diabetes mellitus without complications: Secondary | ICD-10-CM | POA: Diagnosis not present

## 2024-06-11 DIAGNOSIS — N3001 Acute cystitis with hematuria: Secondary | ICD-10-CM | POA: Diagnosis not present

## 2024-06-12 DIAGNOSIS — M6281 Muscle weakness (generalized): Secondary | ICD-10-CM | POA: Diagnosis not present

## 2024-06-12 DIAGNOSIS — F03C11 Unspecified dementia, severe, with agitation: Secondary | ICD-10-CM | POA: Diagnosis not present

## 2024-06-12 DIAGNOSIS — E1122 Type 2 diabetes mellitus with diabetic chronic kidney disease: Secondary | ICD-10-CM | POA: Diagnosis not present

## 2024-06-12 DIAGNOSIS — E119 Type 2 diabetes mellitus without complications: Secondary | ICD-10-CM | POA: Diagnosis not present

## 2024-06-12 DIAGNOSIS — R2689 Other abnormalities of gait and mobility: Secondary | ICD-10-CM | POA: Diagnosis not present

## 2024-06-12 DIAGNOSIS — Z9181 History of falling: Secondary | ICD-10-CM | POA: Diagnosis not present

## 2024-06-13 DIAGNOSIS — F03C11 Unspecified dementia, severe, with agitation: Secondary | ICD-10-CM | POA: Diagnosis not present

## 2024-06-13 DIAGNOSIS — Z9181 History of falling: Secondary | ICD-10-CM | POA: Diagnosis not present

## 2024-06-13 DIAGNOSIS — R2689 Other abnormalities of gait and mobility: Secondary | ICD-10-CM | POA: Diagnosis not present

## 2024-06-13 DIAGNOSIS — M6281 Muscle weakness (generalized): Secondary | ICD-10-CM | POA: Diagnosis not present

## 2024-06-13 DIAGNOSIS — E1122 Type 2 diabetes mellitus with diabetic chronic kidney disease: Secondary | ICD-10-CM | POA: Diagnosis not present

## 2024-06-13 DIAGNOSIS — E119 Type 2 diabetes mellitus without complications: Secondary | ICD-10-CM | POA: Diagnosis not present

## 2024-06-15 DIAGNOSIS — E119 Type 2 diabetes mellitus without complications: Secondary | ICD-10-CM | POA: Diagnosis not present

## 2024-06-15 DIAGNOSIS — M6281 Muscle weakness (generalized): Secondary | ICD-10-CM | POA: Diagnosis not present

## 2024-06-15 DIAGNOSIS — R2689 Other abnormalities of gait and mobility: Secondary | ICD-10-CM | POA: Diagnosis not present

## 2024-06-15 DIAGNOSIS — Z9181 History of falling: Secondary | ICD-10-CM | POA: Diagnosis not present

## 2024-06-15 DIAGNOSIS — F03C11 Unspecified dementia, severe, with agitation: Secondary | ICD-10-CM | POA: Diagnosis not present

## 2024-06-15 DIAGNOSIS — E1122 Type 2 diabetes mellitus with diabetic chronic kidney disease: Secondary | ICD-10-CM | POA: Diagnosis not present

## 2024-06-16 DIAGNOSIS — R2689 Other abnormalities of gait and mobility: Secondary | ICD-10-CM | POA: Diagnosis not present

## 2024-06-16 DIAGNOSIS — Z9181 History of falling: Secondary | ICD-10-CM | POA: Diagnosis not present

## 2024-06-16 DIAGNOSIS — E1122 Type 2 diabetes mellitus with diabetic chronic kidney disease: Secondary | ICD-10-CM | POA: Diagnosis not present

## 2024-06-16 DIAGNOSIS — F03C11 Unspecified dementia, severe, with agitation: Secondary | ICD-10-CM | POA: Diagnosis not present

## 2024-06-16 DIAGNOSIS — M6281 Muscle weakness (generalized): Secondary | ICD-10-CM | POA: Diagnosis not present

## 2024-06-16 DIAGNOSIS — E119 Type 2 diabetes mellitus without complications: Secondary | ICD-10-CM | POA: Diagnosis not present

## 2024-06-17 DIAGNOSIS — E1122 Type 2 diabetes mellitus with diabetic chronic kidney disease: Secondary | ICD-10-CM | POA: Diagnosis not present

## 2024-06-17 DIAGNOSIS — F03C11 Unspecified dementia, severe, with agitation: Secondary | ICD-10-CM | POA: Diagnosis not present

## 2024-06-17 DIAGNOSIS — E119 Type 2 diabetes mellitus without complications: Secondary | ICD-10-CM | POA: Diagnosis not present

## 2024-06-17 DIAGNOSIS — M6281 Muscle weakness (generalized): Secondary | ICD-10-CM | POA: Diagnosis not present

## 2024-06-17 DIAGNOSIS — Z9181 History of falling: Secondary | ICD-10-CM | POA: Diagnosis not present

## 2024-06-17 DIAGNOSIS — R2689 Other abnormalities of gait and mobility: Secondary | ICD-10-CM | POA: Diagnosis not present

## 2024-06-18 DIAGNOSIS — Z9181 History of falling: Secondary | ICD-10-CM | POA: Diagnosis not present

## 2024-06-18 DIAGNOSIS — F03C11 Unspecified dementia, severe, with agitation: Secondary | ICD-10-CM | POA: Diagnosis not present

## 2024-06-18 DIAGNOSIS — E119 Type 2 diabetes mellitus without complications: Secondary | ICD-10-CM | POA: Diagnosis not present

## 2024-06-18 DIAGNOSIS — R2689 Other abnormalities of gait and mobility: Secondary | ICD-10-CM | POA: Diagnosis not present

## 2024-06-18 DIAGNOSIS — N3001 Acute cystitis with hematuria: Secondary | ICD-10-CM | POA: Diagnosis not present

## 2024-06-18 DIAGNOSIS — E1122 Type 2 diabetes mellitus with diabetic chronic kidney disease: Secondary | ICD-10-CM | POA: Diagnosis not present

## 2024-06-18 DIAGNOSIS — M6281 Muscle weakness (generalized): Secondary | ICD-10-CM | POA: Diagnosis not present

## 2024-06-19 DIAGNOSIS — Z9181 History of falling: Secondary | ICD-10-CM | POA: Diagnosis not present

## 2024-06-19 DIAGNOSIS — M6281 Muscle weakness (generalized): Secondary | ICD-10-CM | POA: Diagnosis not present

## 2024-06-19 DIAGNOSIS — F03C11 Unspecified dementia, severe, with agitation: Secondary | ICD-10-CM | POA: Diagnosis not present

## 2024-06-19 DIAGNOSIS — E119 Type 2 diabetes mellitus without complications: Secondary | ICD-10-CM | POA: Diagnosis not present

## 2024-06-19 DIAGNOSIS — R2689 Other abnormalities of gait and mobility: Secondary | ICD-10-CM | POA: Diagnosis not present

## 2024-06-19 DIAGNOSIS — E1122 Type 2 diabetes mellitus with diabetic chronic kidney disease: Secondary | ICD-10-CM | POA: Diagnosis not present

## 2024-06-21 DIAGNOSIS — Z9181 History of falling: Secondary | ICD-10-CM | POA: Diagnosis not present

## 2024-06-21 DIAGNOSIS — F03C11 Unspecified dementia, severe, with agitation: Secondary | ICD-10-CM | POA: Diagnosis not present

## 2024-06-21 DIAGNOSIS — E1122 Type 2 diabetes mellitus with diabetic chronic kidney disease: Secondary | ICD-10-CM | POA: Diagnosis not present

## 2024-06-21 DIAGNOSIS — M6281 Muscle weakness (generalized): Secondary | ICD-10-CM | POA: Diagnosis not present

## 2024-06-21 DIAGNOSIS — R2689 Other abnormalities of gait and mobility: Secondary | ICD-10-CM | POA: Diagnosis not present

## 2024-06-21 DIAGNOSIS — E119 Type 2 diabetes mellitus without complications: Secondary | ICD-10-CM | POA: Diagnosis not present

## 2024-06-22 ENCOUNTER — Other Ambulatory Visit: Payer: Self-pay

## 2024-06-22 DIAGNOSIS — E1122 Type 2 diabetes mellitus with diabetic chronic kidney disease: Secondary | ICD-10-CM | POA: Diagnosis not present

## 2024-06-22 DIAGNOSIS — E119 Type 2 diabetes mellitus without complications: Secondary | ICD-10-CM | POA: Diagnosis not present

## 2024-06-22 DIAGNOSIS — Z9181 History of falling: Secondary | ICD-10-CM | POA: Diagnosis not present

## 2024-06-22 DIAGNOSIS — R2689 Other abnormalities of gait and mobility: Secondary | ICD-10-CM | POA: Diagnosis not present

## 2024-06-22 DIAGNOSIS — M6281 Muscle weakness (generalized): Secondary | ICD-10-CM | POA: Diagnosis not present

## 2024-06-22 DIAGNOSIS — F03C11 Unspecified dementia, severe, with agitation: Secondary | ICD-10-CM | POA: Diagnosis not present

## 2024-06-23 DIAGNOSIS — E1122 Type 2 diabetes mellitus with diabetic chronic kidney disease: Secondary | ICD-10-CM | POA: Diagnosis not present

## 2024-06-23 DIAGNOSIS — E119 Type 2 diabetes mellitus without complications: Secondary | ICD-10-CM | POA: Diagnosis not present

## 2024-06-23 DIAGNOSIS — M6281 Muscle weakness (generalized): Secondary | ICD-10-CM | POA: Diagnosis not present

## 2024-06-23 DIAGNOSIS — F03C11 Unspecified dementia, severe, with agitation: Secondary | ICD-10-CM | POA: Diagnosis not present

## 2024-06-23 DIAGNOSIS — R2689 Other abnormalities of gait and mobility: Secondary | ICD-10-CM | POA: Diagnosis not present

## 2024-06-23 DIAGNOSIS — H109 Unspecified conjunctivitis: Secondary | ICD-10-CM | POA: Diagnosis not present

## 2024-06-23 DIAGNOSIS — Z9181 History of falling: Secondary | ICD-10-CM | POA: Diagnosis not present

## 2024-06-24 DIAGNOSIS — E1122 Type 2 diabetes mellitus with diabetic chronic kidney disease: Secondary | ICD-10-CM | POA: Diagnosis not present

## 2024-06-24 DIAGNOSIS — R2689 Other abnormalities of gait and mobility: Secondary | ICD-10-CM | POA: Diagnosis not present

## 2024-06-24 DIAGNOSIS — M6281 Muscle weakness (generalized): Secondary | ICD-10-CM | POA: Diagnosis not present

## 2024-06-24 DIAGNOSIS — F03C11 Unspecified dementia, severe, with agitation: Secondary | ICD-10-CM | POA: Diagnosis not present

## 2024-06-24 DIAGNOSIS — E119 Type 2 diabetes mellitus without complications: Secondary | ICD-10-CM | POA: Diagnosis not present

## 2024-06-24 DIAGNOSIS — Z9181 History of falling: Secondary | ICD-10-CM | POA: Diagnosis not present

## 2024-06-25 DIAGNOSIS — E119 Type 2 diabetes mellitus without complications: Secondary | ICD-10-CM | POA: Diagnosis not present

## 2024-06-25 DIAGNOSIS — F03C11 Unspecified dementia, severe, with agitation: Secondary | ICD-10-CM | POA: Diagnosis not present

## 2024-06-25 DIAGNOSIS — R2689 Other abnormalities of gait and mobility: Secondary | ICD-10-CM | POA: Diagnosis not present

## 2024-06-25 DIAGNOSIS — Z9181 History of falling: Secondary | ICD-10-CM | POA: Diagnosis not present

## 2024-06-25 DIAGNOSIS — M6281 Muscle weakness (generalized): Secondary | ICD-10-CM | POA: Diagnosis not present

## 2024-06-25 DIAGNOSIS — E1122 Type 2 diabetes mellitus with diabetic chronic kidney disease: Secondary | ICD-10-CM | POA: Diagnosis not present

## 2024-06-26 DIAGNOSIS — R296 Repeated falls: Secondary | ICD-10-CM | POA: Diagnosis not present

## 2024-06-26 DIAGNOSIS — H109 Unspecified conjunctivitis: Secondary | ICD-10-CM | POA: Diagnosis not present

## 2024-06-27 DIAGNOSIS — F03C11 Unspecified dementia, severe, with agitation: Secondary | ICD-10-CM | POA: Diagnosis not present

## 2024-06-27 DIAGNOSIS — Z9181 History of falling: Secondary | ICD-10-CM | POA: Diagnosis not present

## 2024-06-27 DIAGNOSIS — E1122 Type 2 diabetes mellitus with diabetic chronic kidney disease: Secondary | ICD-10-CM | POA: Diagnosis not present

## 2024-06-27 DIAGNOSIS — E119 Type 2 diabetes mellitus without complications: Secondary | ICD-10-CM | POA: Diagnosis not present

## 2024-06-27 DIAGNOSIS — R2689 Other abnormalities of gait and mobility: Secondary | ICD-10-CM | POA: Diagnosis not present

## 2024-06-27 DIAGNOSIS — M6281 Muscle weakness (generalized): Secondary | ICD-10-CM | POA: Diagnosis not present

## 2024-07-08 DIAGNOSIS — N3001 Acute cystitis with hematuria: Secondary | ICD-10-CM | POA: Diagnosis not present

## 2024-08-07 NOTE — Progress Notes (Unsigned)
" °  Cardiology Office Note:  .   Date:  08/07/2024  ID:  Lynwood LELON Barman, DOB [birth year], MRN [id number] PCP: Dwight Trula SQUIBB, MD  [facility] Providers Cardiologist:  None Cardiology APP:  Emelia Josefa HERO, NP  Electrophysiologist:  Fonda Kitty, MD   History of Present Illness: .   No chief complaint on file.   Richard Frederick is a 84 y.o. male with below history who presents for follow-up.   History of Present Illness               Problem List HTN DM Persistent atrial fibrillation -no AC due to falls/dementia  Dementia  CKD 3b    ROS: All other ROS reviewed and negative. Pertinent positives noted in the HPI.     Studies Reviewed: SABRA       TTE 04/09/2024  1. Left ventricular ejection fraction, by estimation, is 55 to 60%. The  left ventricle has normal function. Left ventricular diastolic function  could not be evaluated.   2. Right ventricular systolic function was not well visualized. The right  ventricular size is not well visualized.   3. The mitral valve was not well visualized.   4. The aortic valve was not well visualized.   Physical Exam:   VS:  There were no vitals taken for this visit.   Wt Readings from Last 3 Encounters:  05/13/24 162 lb (73.5 kg)  04/26/24 157 lb 6.5 oz (71.4 kg)  04/12/24 180 lb (81.6 kg)    GEN: Well nourished, well developed in no acute distress NECK: No JVD; No carotid bruits CARDIAC: ***RRR, no murmurs, rubs, gallops RESPIRATORY:  Clear to auscultation without rales, wheezing or rhonchi  ABDOMEN: Soft, non-tender, non-distended EXTREMITIES:  No edema; No deformity  ASSESSMENT AND PLAN: .   Assessment and Plan                 {Are you ordering a CV Procedure (e.g. stress test, cath, DCCV, TEE, etc)?   Press F2        :[id number]}   Follow-up: No follow-ups on file.  Signed, Darryle DASEN. Barbaraann, MD, [facility]  [address] [place], [place] [address] [phone]  12:22 PM   "

## 2024-08-13 ENCOUNTER — Encounter: Payer: Self-pay | Admitting: Cardiovascular Disease

## 2024-08-13 ENCOUNTER — Ambulatory Visit: Admitting: Cardiovascular Disease

## 2024-08-13 VITALS — BP 116/70 | HR 76 | Ht 66.0 in | Wt 154.0 lb

## 2024-08-13 DIAGNOSIS — I10 Essential (primary) hypertension: Secondary | ICD-10-CM

## 2024-08-13 DIAGNOSIS — I4819 Other persistent atrial fibrillation: Secondary | ICD-10-CM | POA: Diagnosis not present

## 2024-08-13 DIAGNOSIS — N1832 Chronic kidney disease, stage 3b: Secondary | ICD-10-CM | POA: Diagnosis not present

## 2024-08-13 NOTE — Patient Instructions (Signed)
 Medication Instructions:  Your physician recommends that you continue on your current medications as directed. Please refer to the Current Medication list given to you today.  *If you need a refill on your cardiac medications before your next appointment, please call your pharmacy*   Follow-Up: At [facility], you and your health needs are our priority.  As part of our continuing mission to provide you with exceptional heart care, our providers are all part of one team.  This team includes your primary Cardiologist (physician) and Advanced Practice Providers or APPs (Physician Assistants and Nurse Practitioners) who all work together to provide you with the care you need, when you need it.  Your next appointment:  We will see you on an as needed basis.   Provider:   Darryle DASEN. Barbaraann, MD, [facility]    We recommend signing up for the patient portal called MyChart.  Sign up information is provided on this After Visit Summary.  MyChart is used to connect with patients for Virtual Visits (Telemedicine).  Patients are able to view lab/test results, encounter notes, upcoming appointments, etc.  Non-urgent messages can be sent to your provider as well.   To learn more about what you can do with MyChart, go to [email or web address].   Other Instructions
